# Patient Record
Sex: Female | Born: 1945
Health system: Southern US, Community
[De-identification: ages and names within clinical notes are randomized; demographics above are authoritative.]

## PROBLEM LIST (undated history)

## (undated) DIAGNOSIS — N1330 Unspecified hydronephrosis: Secondary | ICD-10-CM

## (undated) DIAGNOSIS — G959 Disease of spinal cord, unspecified: Secondary | ICD-10-CM

## (undated) DIAGNOSIS — C801 Malignant (primary) neoplasm, unspecified: Secondary | ICD-10-CM

## (undated) DIAGNOSIS — M8580 Other specified disorders of bone density and structure, unspecified site: Secondary | ICD-10-CM

## (undated) DIAGNOSIS — G4730 Sleep apnea, unspecified: Secondary | ICD-10-CM

## (undated) DIAGNOSIS — F419 Anxiety disorder, unspecified: Secondary | ICD-10-CM

## (undated) DIAGNOSIS — E039 Hypothyroidism, unspecified: Secondary | ICD-10-CM

## (undated) DIAGNOSIS — K219 Gastro-esophageal reflux disease without esophagitis: Secondary | ICD-10-CM

## (undated) DIAGNOSIS — D649 Anemia, unspecified: Secondary | ICD-10-CM

## (undated) DIAGNOSIS — R519 Headache, unspecified: Secondary | ICD-10-CM

## (undated) DIAGNOSIS — Z973 Presence of spectacles and contact lenses: Secondary | ICD-10-CM

## (undated) DIAGNOSIS — I350 Nonrheumatic aortic (valve) stenosis: Secondary | ICD-10-CM

## (undated) DIAGNOSIS — F32A Depression, unspecified: Secondary | ICD-10-CM

## (undated) DIAGNOSIS — R42 Dizziness and giddiness: Secondary | ICD-10-CM

## (undated) DIAGNOSIS — E7800 Pure hypercholesterolemia, unspecified: Secondary | ICD-10-CM

## (undated) DIAGNOSIS — F319 Bipolar disorder, unspecified: Secondary | ICD-10-CM

## (undated) DIAGNOSIS — T4145XA Adverse effect of unspecified anesthetic, initial encounter: Secondary | ICD-10-CM

## (undated) DIAGNOSIS — R200 Anesthesia of skin: Secondary | ICD-10-CM

## (undated) DIAGNOSIS — M51369 Other intervertebral disc degeneration, lumbar region without mention of lumbar back pain or lower extremity pain: Secondary | ICD-10-CM

## (undated) DIAGNOSIS — M4800 Spinal stenosis, site unspecified: Secondary | ICD-10-CM

## (undated) DIAGNOSIS — S42301A Unspecified fracture of shaft of humerus, right arm, initial encounter for closed fracture: Secondary | ICD-10-CM

## (undated) DIAGNOSIS — Z8619 Personal history of other infectious and parasitic diseases: Secondary | ICD-10-CM

## (undated) DIAGNOSIS — M1990 Unspecified osteoarthritis, unspecified site: Secondary | ICD-10-CM

## (undated) DIAGNOSIS — Z8639 Personal history of other endocrine, nutritional and metabolic disease: Secondary | ICD-10-CM

## (undated) DIAGNOSIS — E119 Type 2 diabetes mellitus without complications: Secondary | ICD-10-CM

## (undated) DIAGNOSIS — M810 Age-related osteoporosis without current pathological fracture: Secondary | ICD-10-CM

## (undated) DIAGNOSIS — T8859XA Other complications of anesthesia, initial encounter: Secondary | ICD-10-CM

## (undated) DIAGNOSIS — Z8669 Personal history of other diseases of the nervous system and sense organs: Secondary | ICD-10-CM

## (undated) DIAGNOSIS — M419 Scoliosis, unspecified: Secondary | ICD-10-CM

## (undated) DIAGNOSIS — M5136 Other intervertebral disc degeneration, lumbar region: Secondary | ICD-10-CM

## (undated) DIAGNOSIS — I700 Atherosclerosis of aorta: Secondary | ICD-10-CM

## (undated) DIAGNOSIS — R202 Paresthesia of skin: Secondary | ICD-10-CM

## (undated) HISTORY — PX: CARPAL TUNNEL RELEASE: SHX101

## (undated) HISTORY — PX: FRACTURE SURGERY: SHX138

## (undated) HISTORY — PX: APPENDECTOMY: SHX54

## (undated) HISTORY — PX: TOTAL SHOULDER ARTHROPLASTY: SHX126

---
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
## 2004-10-22 HISTORY — PX: GASTRIC BYPASS: SHX52

## 2004-10-22 HISTORY — PX: EYE SURGERY: SHX253

## 2012-09-25 DIAGNOSIS — Z01419 Encounter for gynecological examination (general) (routine) without abnormal findings: Secondary | ICD-10-CM | POA: Diagnosis not present

## 2012-09-25 DIAGNOSIS — G4700 Insomnia, unspecified: Secondary | ICD-10-CM | POA: Diagnosis not present

## 2012-09-25 DIAGNOSIS — L259 Unspecified contact dermatitis, unspecified cause: Secondary | ICD-10-CM | POA: Diagnosis not present

## 2012-09-25 DIAGNOSIS — F411 Generalized anxiety disorder: Secondary | ICD-10-CM | POA: Diagnosis not present

## 2012-10-02 DIAGNOSIS — F3189 Other bipolar disorder: Secondary | ICD-10-CM | POA: Diagnosis not present

## 2012-12-30 DIAGNOSIS — F3189 Other bipolar disorder: Secondary | ICD-10-CM | POA: Diagnosis not present

## 2013-02-26 DIAGNOSIS — H52219 Irregular astigmatism, unspecified eye: Secondary | ICD-10-CM | POA: Diagnosis not present

## 2013-03-26 DIAGNOSIS — F3189 Other bipolar disorder: Secondary | ICD-10-CM | POA: Diagnosis not present

## 2013-04-02 DIAGNOSIS — E119 Type 2 diabetes mellitus without complications: Secondary | ICD-10-CM | POA: Diagnosis not present

## 2013-04-02 DIAGNOSIS — R5383 Other fatigue: Secondary | ICD-10-CM | POA: Diagnosis not present

## 2013-04-02 DIAGNOSIS — E7800 Pure hypercholesterolemia, unspecified: Secondary | ICD-10-CM | POA: Diagnosis not present

## 2013-04-02 DIAGNOSIS — IMO0001 Reserved for inherently not codable concepts without codable children: Secondary | ICD-10-CM | POA: Diagnosis not present

## 2013-04-22 DIAGNOSIS — E538 Deficiency of other specified B group vitamins: Secondary | ICD-10-CM | POA: Diagnosis not present

## 2013-04-22 DIAGNOSIS — IMO0001 Reserved for inherently not codable concepts without codable children: Secondary | ICD-10-CM | POA: Diagnosis not present

## 2013-04-22 DIAGNOSIS — R5383 Other fatigue: Secondary | ICD-10-CM | POA: Diagnosis not present

## 2013-04-22 DIAGNOSIS — E119 Type 2 diabetes mellitus without complications: Secondary | ICD-10-CM | POA: Diagnosis not present

## 2013-04-22 DIAGNOSIS — E7800 Pure hypercholesterolemia, unspecified: Secondary | ICD-10-CM | POA: Diagnosis not present

## 2013-04-22 DIAGNOSIS — E559 Vitamin D deficiency, unspecified: Secondary | ICD-10-CM | POA: Diagnosis not present

## 2013-04-22 DIAGNOSIS — L659 Nonscarring hair loss, unspecified: Secondary | ICD-10-CM | POA: Diagnosis not present

## 2013-04-22 DIAGNOSIS — G4730 Sleep apnea, unspecified: Secondary | ICD-10-CM | POA: Diagnosis not present

## 2013-05-07 DIAGNOSIS — E119 Type 2 diabetes mellitus without complications: Secondary | ICD-10-CM | POA: Diagnosis not present

## 2013-05-07 DIAGNOSIS — G4700 Insomnia, unspecified: Secondary | ICD-10-CM | POA: Diagnosis not present

## 2013-05-07 DIAGNOSIS — E039 Hypothyroidism, unspecified: Secondary | ICD-10-CM | POA: Diagnosis not present

## 2013-05-07 DIAGNOSIS — E7800 Pure hypercholesterolemia, unspecified: Secondary | ICD-10-CM | POA: Diagnosis not present

## 2013-07-31 DIAGNOSIS — E119 Type 2 diabetes mellitus without complications: Secondary | ICD-10-CM | POA: Diagnosis not present

## 2013-07-31 DIAGNOSIS — E039 Hypothyroidism, unspecified: Secondary | ICD-10-CM | POA: Diagnosis not present

## 2013-08-18 DIAGNOSIS — E119 Type 2 diabetes mellitus without complications: Secondary | ICD-10-CM | POA: Diagnosis not present

## 2013-08-18 DIAGNOSIS — M542 Cervicalgia: Secondary | ICD-10-CM | POA: Diagnosis not present

## 2013-08-18 DIAGNOSIS — R29898 Other symptoms and signs involving the musculoskeletal system: Secondary | ICD-10-CM | POA: Diagnosis not present

## 2013-08-18 DIAGNOSIS — E039 Hypothyroidism, unspecified: Secondary | ICD-10-CM | POA: Diagnosis not present

## 2014-02-05 DIAGNOSIS — E7800 Pure hypercholesterolemia, unspecified: Secondary | ICD-10-CM | POA: Diagnosis not present

## 2014-02-05 DIAGNOSIS — Z1231 Encounter for screening mammogram for malignant neoplasm of breast: Secondary | ICD-10-CM | POA: Diagnosis not present

## 2014-02-05 DIAGNOSIS — E119 Type 2 diabetes mellitus without complications: Secondary | ICD-10-CM | POA: Diagnosis not present

## 2014-02-05 DIAGNOSIS — E039 Hypothyroidism, unspecified: Secondary | ICD-10-CM | POA: Diagnosis not present

## 2014-02-16 DIAGNOSIS — E039 Hypothyroidism, unspecified: Secondary | ICD-10-CM | POA: Diagnosis not present

## 2014-02-16 DIAGNOSIS — Z23 Encounter for immunization: Secondary | ICD-10-CM | POA: Diagnosis not present

## 2014-02-16 DIAGNOSIS — E7800 Pure hypercholesterolemia, unspecified: Secondary | ICD-10-CM | POA: Diagnosis not present

## 2014-02-16 DIAGNOSIS — J309 Allergic rhinitis, unspecified: Secondary | ICD-10-CM | POA: Diagnosis not present

## 2014-02-16 DIAGNOSIS — E559 Vitamin D deficiency, unspecified: Secondary | ICD-10-CM | POA: Diagnosis not present

## 2014-02-19 DIAGNOSIS — Z13820 Encounter for screening for osteoporosis: Secondary | ICD-10-CM | POA: Diagnosis not present

## 2014-02-19 DIAGNOSIS — Z780 Asymptomatic menopausal state: Secondary | ICD-10-CM | POA: Diagnosis not present

## 2014-02-19 DIAGNOSIS — R29890 Loss of height: Secondary | ICD-10-CM | POA: Diagnosis not present

## 2014-02-19 DIAGNOSIS — M949 Disorder of cartilage, unspecified: Secondary | ICD-10-CM | POA: Diagnosis not present

## 2014-02-19 DIAGNOSIS — M899 Disorder of bone, unspecified: Secondary | ICD-10-CM | POA: Diagnosis not present

## 2014-03-13 DIAGNOSIS — Z1211 Encounter for screening for malignant neoplasm of colon: Secondary | ICD-10-CM | POA: Diagnosis not present

## 2014-03-15 DIAGNOSIS — Z1211 Encounter for screening for malignant neoplasm of colon: Secondary | ICD-10-CM | POA: Diagnosis not present

## 2014-03-17 DIAGNOSIS — Z1211 Encounter for screening for malignant neoplasm of colon: Secondary | ICD-10-CM | POA: Diagnosis not present

## 2014-07-02 DIAGNOSIS — F3110 Bipolar disorder, current episode manic without psychotic features, unspecified: Secondary | ICD-10-CM | POA: Diagnosis not present

## 2014-07-28 DIAGNOSIS — F3110 Bipolar disorder, current episode manic without psychotic features, unspecified: Secondary | ICD-10-CM | POA: Diagnosis not present

## 2014-08-16 DIAGNOSIS — K219 Gastro-esophageal reflux disease without esophagitis: Secondary | ICD-10-CM | POA: Diagnosis not present

## 2014-08-16 DIAGNOSIS — E039 Hypothyroidism, unspecified: Secondary | ICD-10-CM | POA: Diagnosis not present

## 2014-08-16 DIAGNOSIS — Z5181 Encounter for therapeutic drug level monitoring: Secondary | ICD-10-CM | POA: Diagnosis not present

## 2014-08-16 DIAGNOSIS — R05 Cough: Secondary | ICD-10-CM | POA: Diagnosis not present

## 2014-08-16 DIAGNOSIS — M8580 Other specified disorders of bone density and structure, unspecified site: Secondary | ICD-10-CM | POA: Diagnosis not present

## 2014-08-16 DIAGNOSIS — E119 Type 2 diabetes mellitus without complications: Secondary | ICD-10-CM | POA: Diagnosis not present

## 2014-08-16 DIAGNOSIS — I10 Essential (primary) hypertension: Secondary | ICD-10-CM | POA: Diagnosis not present

## 2014-09-29 DIAGNOSIS — F3110 Bipolar disorder, current episode manic without psychotic features, unspecified: Secondary | ICD-10-CM | POA: Diagnosis not present

## 2014-11-17 DIAGNOSIS — M159 Polyosteoarthritis, unspecified: Secondary | ICD-10-CM | POA: Diagnosis not present

## 2014-11-17 DIAGNOSIS — R600 Localized edema: Secondary | ICD-10-CM | POA: Diagnosis not present

## 2014-11-17 DIAGNOSIS — Z9884 Bariatric surgery status: Secondary | ICD-10-CM | POA: Diagnosis not present

## 2014-11-17 DIAGNOSIS — Z23 Encounter for immunization: Secondary | ICD-10-CM | POA: Diagnosis not present

## 2014-11-17 DIAGNOSIS — E785 Hyperlipidemia, unspecified: Secondary | ICD-10-CM | POA: Diagnosis not present

## 2014-12-29 DIAGNOSIS — F3110 Bipolar disorder, current episode manic without psychotic features, unspecified: Secondary | ICD-10-CM | POA: Diagnosis not present

## 2015-03-09 DIAGNOSIS — M5412 Radiculopathy, cervical region: Secondary | ICD-10-CM | POA: Diagnosis not present

## 2015-03-09 DIAGNOSIS — E119 Type 2 diabetes mellitus without complications: Secondary | ICD-10-CM | POA: Diagnosis not present

## 2015-03-09 DIAGNOSIS — M654 Radial styloid tenosynovitis [de Quervain]: Secondary | ICD-10-CM | POA: Diagnosis not present

## 2015-03-09 DIAGNOSIS — R600 Localized edema: Secondary | ICD-10-CM | POA: Diagnosis not present

## 2015-03-09 DIAGNOSIS — E039 Hypothyroidism, unspecified: Secondary | ICD-10-CM | POA: Diagnosis not present

## 2015-03-09 DIAGNOSIS — M159 Polyosteoarthritis, unspecified: Secondary | ICD-10-CM | POA: Diagnosis not present

## 2015-03-11 DIAGNOSIS — M5412 Radiculopathy, cervical region: Secondary | ICD-10-CM | POA: Diagnosis not present

## 2015-03-11 DIAGNOSIS — M159 Polyosteoarthritis, unspecified: Secondary | ICD-10-CM | POA: Diagnosis not present

## 2015-03-14 DIAGNOSIS — M159 Polyosteoarthritis, unspecified: Secondary | ICD-10-CM | POA: Diagnosis not present

## 2015-03-14 DIAGNOSIS — M5412 Radiculopathy, cervical region: Secondary | ICD-10-CM | POA: Diagnosis not present

## 2015-03-16 DIAGNOSIS — M159 Polyosteoarthritis, unspecified: Secondary | ICD-10-CM | POA: Diagnosis not present

## 2015-03-16 DIAGNOSIS — M5412 Radiculopathy, cervical region: Secondary | ICD-10-CM | POA: Diagnosis not present

## 2015-03-22 DIAGNOSIS — M5412 Radiculopathy, cervical region: Secondary | ICD-10-CM | POA: Diagnosis not present

## 2015-03-22 DIAGNOSIS — M159 Polyosteoarthritis, unspecified: Secondary | ICD-10-CM | POA: Diagnosis not present

## 2015-03-24 DIAGNOSIS — M159 Polyosteoarthritis, unspecified: Secondary | ICD-10-CM | POA: Diagnosis not present

## 2015-03-24 DIAGNOSIS — M5412 Radiculopathy, cervical region: Secondary | ICD-10-CM | POA: Diagnosis not present

## 2015-03-28 DIAGNOSIS — M159 Polyosteoarthritis, unspecified: Secondary | ICD-10-CM | POA: Diagnosis not present

## 2015-03-28 DIAGNOSIS — M5412 Radiculopathy, cervical region: Secondary | ICD-10-CM | POA: Diagnosis not present

## 2015-03-30 DIAGNOSIS — M5412 Radiculopathy, cervical region: Secondary | ICD-10-CM | POA: Diagnosis not present

## 2015-03-30 DIAGNOSIS — F3110 Bipolar disorder, current episode manic without psychotic features, unspecified: Secondary | ICD-10-CM | POA: Diagnosis not present

## 2015-03-30 DIAGNOSIS — M159 Polyosteoarthritis, unspecified: Secondary | ICD-10-CM | POA: Diagnosis not present

## 2015-04-04 DIAGNOSIS — M159 Polyosteoarthritis, unspecified: Secondary | ICD-10-CM | POA: Diagnosis not present

## 2015-04-04 DIAGNOSIS — M5412 Radiculopathy, cervical region: Secondary | ICD-10-CM | POA: Diagnosis not present

## 2015-04-06 DIAGNOSIS — M5412 Radiculopathy, cervical region: Secondary | ICD-10-CM | POA: Diagnosis not present

## 2015-04-06 DIAGNOSIS — M159 Polyosteoarthritis, unspecified: Secondary | ICD-10-CM | POA: Diagnosis not present

## 2015-04-07 DIAGNOSIS — M159 Polyosteoarthritis, unspecified: Secondary | ICD-10-CM | POA: Diagnosis not present

## 2015-04-07 DIAGNOSIS — M5412 Radiculopathy, cervical region: Secondary | ICD-10-CM | POA: Diagnosis not present

## 2015-04-08 DIAGNOSIS — M5012 Cervical disc disorder with radiculopathy, mid-cervical region: Secondary | ICD-10-CM | POA: Diagnosis not present

## 2015-04-14 DIAGNOSIS — M159 Polyosteoarthritis, unspecified: Secondary | ICD-10-CM | POA: Diagnosis not present

## 2015-04-14 DIAGNOSIS — M5412 Radiculopathy, cervical region: Secondary | ICD-10-CM | POA: Diagnosis not present

## 2015-04-18 DIAGNOSIS — M25561 Pain in right knee: Secondary | ICD-10-CM | POA: Diagnosis not present

## 2015-04-19 DIAGNOSIS — M5412 Radiculopathy, cervical region: Secondary | ICD-10-CM | POA: Diagnosis not present

## 2015-04-19 DIAGNOSIS — M159 Polyosteoarthritis, unspecified: Secondary | ICD-10-CM | POA: Diagnosis not present

## 2015-04-22 DIAGNOSIS — M4802 Spinal stenosis, cervical region: Secondary | ICD-10-CM | POA: Diagnosis not present

## 2015-04-22 DIAGNOSIS — M5012 Cervical disc disorder with radiculopathy, mid-cervical region: Secondary | ICD-10-CM | POA: Diagnosis not present

## 2015-04-22 DIAGNOSIS — M4692 Unspecified inflammatory spondylopathy, cervical region: Secondary | ICD-10-CM | POA: Diagnosis not present

## 2015-04-27 DIAGNOSIS — M5412 Radiculopathy, cervical region: Secondary | ICD-10-CM | POA: Diagnosis not present

## 2015-04-27 DIAGNOSIS — M159 Polyosteoarthritis, unspecified: Secondary | ICD-10-CM | POA: Diagnosis not present

## 2015-05-11 DIAGNOSIS — M159 Polyosteoarthritis, unspecified: Secondary | ICD-10-CM | POA: Diagnosis not present

## 2015-05-11 DIAGNOSIS — M5412 Radiculopathy, cervical region: Secondary | ICD-10-CM | POA: Diagnosis not present

## 2015-05-17 DIAGNOSIS — M5412 Radiculopathy, cervical region: Secondary | ICD-10-CM | POA: Diagnosis not present

## 2015-05-17 DIAGNOSIS — M159 Polyosteoarthritis, unspecified: Secondary | ICD-10-CM | POA: Diagnosis not present

## 2015-05-19 DIAGNOSIS — M5412 Radiculopathy, cervical region: Secondary | ICD-10-CM | POA: Diagnosis not present

## 2015-05-19 DIAGNOSIS — M159 Polyosteoarthritis, unspecified: Secondary | ICD-10-CM | POA: Diagnosis not present

## 2015-05-23 DIAGNOSIS — M1811 Unilateral primary osteoarthritis of first carpometacarpal joint, right hand: Secondary | ICD-10-CM | POA: Diagnosis not present

## 2015-06-28 DIAGNOSIS — M1811 Unilateral primary osteoarthritis of first carpometacarpal joint, right hand: Secondary | ICD-10-CM | POA: Diagnosis not present

## 2015-06-30 DIAGNOSIS — F3110 Bipolar disorder, current episode manic without psychotic features, unspecified: Secondary | ICD-10-CM | POA: Diagnosis not present

## 2015-07-04 DIAGNOSIS — K219 Gastro-esophageal reflux disease without esophagitis: Secondary | ICD-10-CM | POA: Diagnosis not present

## 2015-07-04 DIAGNOSIS — Z0000 Encounter for general adult medical examination without abnormal findings: Secondary | ICD-10-CM | POA: Diagnosis not present

## 2015-07-04 DIAGNOSIS — R600 Localized edema: Secondary | ICD-10-CM | POA: Diagnosis not present

## 2015-07-04 DIAGNOSIS — H9193 Unspecified hearing loss, bilateral: Secondary | ICD-10-CM | POA: Diagnosis not present

## 2015-07-04 DIAGNOSIS — E119 Type 2 diabetes mellitus without complications: Secondary | ICD-10-CM | POA: Diagnosis not present

## 2015-07-04 DIAGNOSIS — M159 Polyosteoarthritis, unspecified: Secondary | ICD-10-CM | POA: Diagnosis not present

## 2015-07-04 DIAGNOSIS — E039 Hypothyroidism, unspecified: Secondary | ICD-10-CM | POA: Diagnosis not present

## 2015-07-04 DIAGNOSIS — J302 Other seasonal allergic rhinitis: Secondary | ICD-10-CM | POA: Diagnosis not present

## 2015-07-04 DIAGNOSIS — R296 Repeated falls: Secondary | ICD-10-CM | POA: Diagnosis not present

## 2015-07-04 DIAGNOSIS — M8589 Other specified disorders of bone density and structure, multiple sites: Secondary | ICD-10-CM | POA: Diagnosis not present

## 2015-07-04 DIAGNOSIS — E785 Hyperlipidemia, unspecified: Secondary | ICD-10-CM | POA: Diagnosis not present

## 2015-07-11 DIAGNOSIS — R296 Repeated falls: Secondary | ICD-10-CM | POA: Diagnosis not present

## 2015-07-11 DIAGNOSIS — R2689 Other abnormalities of gait and mobility: Secondary | ICD-10-CM | POA: Diagnosis not present

## 2015-07-12 DIAGNOSIS — R2689 Other abnormalities of gait and mobility: Secondary | ICD-10-CM | POA: Diagnosis not present

## 2015-07-12 DIAGNOSIS — R296 Repeated falls: Secondary | ICD-10-CM | POA: Diagnosis not present

## 2015-07-18 DIAGNOSIS — R2689 Other abnormalities of gait and mobility: Secondary | ICD-10-CM | POA: Diagnosis not present

## 2015-07-18 DIAGNOSIS — R296 Repeated falls: Secondary | ICD-10-CM | POA: Diagnosis not present

## 2015-07-21 DIAGNOSIS — R2689 Other abnormalities of gait and mobility: Secondary | ICD-10-CM | POA: Diagnosis not present

## 2015-07-21 DIAGNOSIS — R296 Repeated falls: Secondary | ICD-10-CM | POA: Diagnosis not present

## 2015-07-25 DIAGNOSIS — R2689 Other abnormalities of gait and mobility: Secondary | ICD-10-CM | POA: Diagnosis not present

## 2015-07-25 DIAGNOSIS — R296 Repeated falls: Secondary | ICD-10-CM | POA: Diagnosis not present

## 2015-07-28 DIAGNOSIS — F3110 Bipolar disorder, current episode manic without psychotic features, unspecified: Secondary | ICD-10-CM | POA: Diagnosis not present

## 2015-07-28 DIAGNOSIS — R2689 Other abnormalities of gait and mobility: Secondary | ICD-10-CM | POA: Diagnosis not present

## 2015-07-28 DIAGNOSIS — R296 Repeated falls: Secondary | ICD-10-CM | POA: Diagnosis not present

## 2015-08-03 DIAGNOSIS — H532 Diplopia: Secondary | ICD-10-CM | POA: Diagnosis not present

## 2015-08-03 DIAGNOSIS — R42 Dizziness and giddiness: Secondary | ICD-10-CM | POA: Diagnosis not present

## 2015-08-03 DIAGNOSIS — Z1211 Encounter for screening for malignant neoplasm of colon: Secondary | ICD-10-CM | POA: Diagnosis not present

## 2015-08-04 DIAGNOSIS — R2689 Other abnormalities of gait and mobility: Secondary | ICD-10-CM | POA: Diagnosis not present

## 2015-08-04 DIAGNOSIS — R296 Repeated falls: Secondary | ICD-10-CM | POA: Diagnosis not present

## 2015-08-08 DIAGNOSIS — R296 Repeated falls: Secondary | ICD-10-CM | POA: Diagnosis not present

## 2015-08-08 DIAGNOSIS — R2689 Other abnormalities of gait and mobility: Secondary | ICD-10-CM | POA: Diagnosis not present

## 2015-08-10 DIAGNOSIS — R296 Repeated falls: Secondary | ICD-10-CM | POA: Diagnosis not present

## 2015-08-10 DIAGNOSIS — R2689 Other abnormalities of gait and mobility: Secondary | ICD-10-CM | POA: Diagnosis not present

## 2015-08-24 DIAGNOSIS — R296 Repeated falls: Secondary | ICD-10-CM | POA: Diagnosis not present

## 2015-08-24 DIAGNOSIS — R2689 Other abnormalities of gait and mobility: Secondary | ICD-10-CM | POA: Diagnosis not present

## 2015-08-25 DIAGNOSIS — Z961 Presence of intraocular lens: Secondary | ICD-10-CM | POA: Diagnosis not present

## 2015-08-25 DIAGNOSIS — H5111 Convergence insufficiency: Secondary | ICD-10-CM | POA: Diagnosis not present

## 2015-08-25 DIAGNOSIS — E119 Type 2 diabetes mellitus without complications: Secondary | ICD-10-CM | POA: Diagnosis not present

## 2015-08-29 DIAGNOSIS — R2689 Other abnormalities of gait and mobility: Secondary | ICD-10-CM | POA: Diagnosis not present

## 2015-08-29 DIAGNOSIS — R296 Repeated falls: Secondary | ICD-10-CM | POA: Diagnosis not present

## 2015-09-01 DIAGNOSIS — R2689 Other abnormalities of gait and mobility: Secondary | ICD-10-CM | POA: Diagnosis not present

## 2015-09-01 DIAGNOSIS — R296 Repeated falls: Secondary | ICD-10-CM | POA: Diagnosis not present

## 2015-09-05 DIAGNOSIS — R2689 Other abnormalities of gait and mobility: Secondary | ICD-10-CM | POA: Diagnosis not present

## 2015-09-05 DIAGNOSIS — R296 Repeated falls: Secondary | ICD-10-CM | POA: Diagnosis not present

## 2015-09-07 DIAGNOSIS — R296 Repeated falls: Secondary | ICD-10-CM | POA: Diagnosis not present

## 2015-09-07 DIAGNOSIS — R2689 Other abnormalities of gait and mobility: Secondary | ICD-10-CM | POA: Diagnosis not present

## 2015-09-08 DIAGNOSIS — D125 Benign neoplasm of sigmoid colon: Secondary | ICD-10-CM | POA: Diagnosis not present

## 2015-09-08 DIAGNOSIS — Z1211 Encounter for screening for malignant neoplasm of colon: Secondary | ICD-10-CM | POA: Diagnosis not present

## 2015-09-08 DIAGNOSIS — K640 First degree hemorrhoids: Secondary | ICD-10-CM | POA: Diagnosis not present

## 2015-09-08 DIAGNOSIS — K5730 Diverticulosis of large intestine without perforation or abscess without bleeding: Secondary | ICD-10-CM | POA: Diagnosis not present

## 2015-09-08 DIAGNOSIS — K635 Polyp of colon: Secondary | ICD-10-CM | POA: Diagnosis not present

## 2015-09-12 DIAGNOSIS — R296 Repeated falls: Secondary | ICD-10-CM | POA: Diagnosis not present

## 2015-09-12 DIAGNOSIS — R2689 Other abnormalities of gait and mobility: Secondary | ICD-10-CM | POA: Diagnosis not present

## 2015-09-19 DIAGNOSIS — R2689 Other abnormalities of gait and mobility: Secondary | ICD-10-CM | POA: Diagnosis not present

## 2015-09-19 DIAGNOSIS — R296 Repeated falls: Secondary | ICD-10-CM | POA: Diagnosis not present

## 2015-09-21 DIAGNOSIS — R296 Repeated falls: Secondary | ICD-10-CM | POA: Diagnosis not present

## 2015-09-21 DIAGNOSIS — R2689 Other abnormalities of gait and mobility: Secondary | ICD-10-CM | POA: Diagnosis not present

## 2015-09-22 DIAGNOSIS — F3110 Bipolar disorder, current episode manic without psychotic features, unspecified: Secondary | ICD-10-CM | POA: Diagnosis not present

## 2015-09-29 DIAGNOSIS — M4712 Other spondylosis with myelopathy, cervical region: Secondary | ICD-10-CM | POA: Diagnosis not present

## 2015-09-29 DIAGNOSIS — R2689 Other abnormalities of gait and mobility: Secondary | ICD-10-CM | POA: Diagnosis not present

## 2015-09-29 DIAGNOSIS — H532 Diplopia: Secondary | ICD-10-CM | POA: Diagnosis not present

## 2015-10-07 DIAGNOSIS — R2689 Other abnormalities of gait and mobility: Secondary | ICD-10-CM | POA: Diagnosis not present

## 2015-10-07 DIAGNOSIS — H532 Diplopia: Secondary | ICD-10-CM | POA: Diagnosis not present

## 2015-10-07 DIAGNOSIS — Z1389 Encounter for screening for other disorder: Secondary | ICD-10-CM | POA: Diagnosis not present

## 2015-11-02 DIAGNOSIS — R2689 Other abnormalities of gait and mobility: Secondary | ICD-10-CM | POA: Diagnosis not present

## 2015-11-14 ENCOUNTER — Other Ambulatory Visit (HOSPITAL_COMMUNITY): Payer: Self-pay | Admitting: Neurological Surgery

## 2015-11-14 DIAGNOSIS — M4313 Spondylolisthesis, cervicothoracic region: Secondary | ICD-10-CM | POA: Diagnosis not present

## 2015-11-14 DIAGNOSIS — M4802 Spinal stenosis, cervical region: Secondary | ICD-10-CM | POA: Diagnosis not present

## 2015-11-22 ENCOUNTER — Encounter (HOSPITAL_COMMUNITY): Payer: Self-pay

## 2015-11-22 ENCOUNTER — Encounter (HOSPITAL_COMMUNITY)
Admission: RE | Admit: 2015-11-22 | Discharge: 2015-11-22 | Disposition: A | Discharge status: Home / Self Care | Payer: Medicare Other | Source: Ambulatory Visit | Attending: Neurological Surgery | Admitting: Neurological Surgery

## 2015-11-22 ENCOUNTER — Ambulatory Visit (HOSPITAL_COMMUNITY)
Admission: RE | Admit: 2015-11-22 | Discharge: 2015-11-22 | Disposition: A | Discharge status: Home / Self Care | Payer: Medicare Other | Source: Ambulatory Visit | Attending: Neurological Surgery | Admitting: Neurological Surgery

## 2015-11-22 DIAGNOSIS — M5023 Other cervical disc displacement, cervicothoracic region: Secondary | ICD-10-CM | POA: Diagnosis not present

## 2015-11-22 DIAGNOSIS — Z01812 Encounter for preprocedural laboratory examination: Secondary | ICD-10-CM

## 2015-11-22 DIAGNOSIS — Z7982 Long term (current) use of aspirin: Secondary | ICD-10-CM | POA: Insufficient documentation

## 2015-11-22 DIAGNOSIS — M4803 Spinal stenosis, cervicothoracic region: Secondary | ICD-10-CM | POA: Diagnosis not present

## 2015-11-22 DIAGNOSIS — Z01810 Encounter for preprocedural cardiovascular examination: Secondary | ICD-10-CM | POA: Insufficient documentation

## 2015-11-22 DIAGNOSIS — Z79899 Other long term (current) drug therapy: Secondary | ICD-10-CM | POA: Insufficient documentation

## 2015-11-22 DIAGNOSIS — M4802 Spinal stenosis, cervical region: Secondary | ICD-10-CM | POA: Insufficient documentation

## 2015-11-22 DIAGNOSIS — Z01818 Encounter for other preprocedural examination: Secondary | ICD-10-CM | POA: Insufficient documentation

## 2015-11-22 DIAGNOSIS — Z01811 Encounter for preprocedural respiratory examination: Secondary | ICD-10-CM | POA: Diagnosis not present

## 2015-11-22 DIAGNOSIS — M47813 Spondylosis without myelopathy or radiculopathy, cervicothoracic region: Secondary | ICD-10-CM | POA: Diagnosis not present

## 2015-11-22 DIAGNOSIS — E039 Hypothyroidism, unspecified: Secondary | ICD-10-CM | POA: Diagnosis not present

## 2015-11-22 HISTORY — DX: Unspecified osteoarthritis, unspecified site: M19.90

## 2015-11-22 HISTORY — DX: Presence of spectacles and contact lenses: Z97.3

## 2015-11-22 HISTORY — DX: Personal history of other diseases of the nervous system and sense organs: Z86.69

## 2015-11-22 HISTORY — DX: Hypothyroidism, unspecified: E03.9

## 2015-11-22 HISTORY — DX: Anesthesia of skin: R20.0

## 2015-11-22 HISTORY — DX: Bipolar disorder, unspecified: F31.9

## 2015-11-22 HISTORY — DX: Anesthesia of skin: R20.2

## 2015-11-22 LAB — CBC WITH DIFFERENTIAL/PLATELET
BASOS PCT: 0 %
Basophils Absolute: 0 10*3/uL (ref 0.0–0.1)
EOS ABS: 0.2 10*3/uL (ref 0.0–0.7)
EOS PCT: 2 %
HCT: 34.6 % — ABNORMAL LOW (ref 36.0–46.0)
HEMOGLOBIN: 11.6 g/dL — AB (ref 12.0–15.0)
Lymphocytes Relative: 30 %
Lymphs Abs: 2.6 10*3/uL (ref 0.7–4.0)
MCH: 31.6 pg (ref 26.0–34.0)
MCHC: 33.5 g/dL (ref 30.0–36.0)
MCV: 94.3 fL (ref 78.0–100.0)
MONOS PCT: 6 %
Monocytes Absolute: 0.6 10*3/uL (ref 0.1–1.0)
NEUTROS PCT: 62 %
Neutro Abs: 5.4 10*3/uL (ref 1.7–7.7)
PLATELETS: 226 10*3/uL (ref 150–400)
RBC: 3.67 MIL/uL — AB (ref 3.87–5.11)
RDW: 13.4 % (ref 11.5–15.5)
WBC: 8.7 10*3/uL (ref 4.0–10.5)

## 2015-11-22 LAB — TYPE AND SCREEN
ABO/RH(D): A POS
Antibody Screen: NEGATIVE

## 2015-11-22 LAB — BASIC METABOLIC PANEL
Anion gap: 9 (ref 5–15)
BUN: 7 mg/dL (ref 6–20)
CALCIUM: 9.1 mg/dL (ref 8.9–10.3)
CO2: 27 mmol/L (ref 22–32)
CREATININE: 0.72 mg/dL (ref 0.44–1.00)
Chloride: 107 mmol/L (ref 101–111)
GFR calc Af Amer: >60 mL/min (ref >60)
GFR calc non Af Amer: >60 mL/min (ref >60)
Glucose, Bld: 102 mg/dL — ABNORMAL HIGH (ref 65–99)
Potassium: 3.6 mmol/L (ref 3.5–5.1)
Sodium: 143 mmol/L (ref 135–145)

## 2015-11-22 LAB — SURGICAL PCR SCREEN
MRSA, PCR: NEGATIVE
Staphylococcus aureus: NEGATIVE

## 2015-11-22 LAB — PROTIME-INR
INR: 0.94 (ref 0.00–1.49)
PROTHROMBIN TIME: 12.7 s (ref 11.6–15.2)

## 2015-11-22 LAB — ABO/RH: ABO/RH(D): A POS

## 2015-11-22 NOTE — Pre-Procedure Instructions (Signed)
    MIOSHA SUITT  11/22/2015      [id number] - [place], [place] - [address] [place] [address] Phone: [phone] Fax: [phone]    Your procedure is scheduled on Friday, February 3rd, 2017.  Report to [facility] Admitting at 8:00 A.M.  Call this number if you have problems the morning of surgery:  [phone]   Remember:  Do not eat food or drink liquids after midnight.   Take these medicines the morning of surgery with A SIP OF WATER: Acetaminophen (Tylenol) if needed, Alprazolam (Xanax) if needed, Lamotrigine (Lamictal), Levothyroxine (Synthroid), Paroxetine (Paxil).  Stop taking: Aspirin, Meloxicam (Mobic), NSAIDS, Aleve, Naproxen, Ibuprofen, Advil, Motrin, BC's, Goody's, Fish oil, all herbal medications, and all vitamins.    Do not wear jewelry, make-up or nail polish.  Do not wear lotions, powders, or perfumes.  You may NOT wear deodorant.  Do not shave 48 hours prior to surgery.    Do not bring valuables to the hospital.  [facility] is not responsible for any belongings or valuables.  Contacts, dentures or bridgework may not be worn into surgery.  Leave your suitcase in the car.  After surgery it may be brought to your room.  For patients admitted to the hospital, discharge time will be determined by your treatment team.  Patients discharged the day of surgery will not be allowed to drive home.   Special instructions:  See attached.   Please read over the following fact sheets that you were given. Pain Booklet, Coughing and Deep Breathing, Blood Transfusion Information, MRSA Information and Surgical Site Infection Prevention

## 2015-11-22 NOTE — Progress Notes (Signed)
PCP - Dr. Betty Martinique Cardiologist - denies  EKG - 11/22/15 CXR - 11/22/15  Echo/Stress test/Cath - denies  Patient denies chest pain and shortness of breath at PAT appointment.

## 2015-11-25 ENCOUNTER — Encounter (HOSPITAL_COMMUNITY)
Admission: RE | Disposition: A | Discharge status: Home / Self Care | Payer: Self-pay | Source: Ambulatory Visit | Attending: Neurological Surgery

## 2015-11-25 ENCOUNTER — Inpatient Hospital Stay (HOSPITAL_COMMUNITY): Payer: Medicare Other | Admitting: Certified Registered Nurse Anesthetist

## 2015-11-25 ENCOUNTER — Encounter (HOSPITAL_COMMUNITY): Payer: Self-pay | Admitting: *Deleted

## 2015-11-25 ENCOUNTER — Inpatient Hospital Stay (HOSPITAL_COMMUNITY): Payer: Medicare Other

## 2015-11-25 ENCOUNTER — Inpatient Hospital Stay (HOSPITAL_COMMUNITY)
Admission: RE | Admit: 2015-11-25 | Discharge: 2015-11-29 | DRG: 473 | Disposition: A | Discharge status: Home / Self Care | Payer: Medicare Other | Source: Ambulatory Visit | Attending: Neurological Surgery | Admitting: Neurological Surgery

## 2015-11-25 DIAGNOSIS — Z96612 Presence of left artificial shoulder joint: Secondary | ICD-10-CM | POA: Diagnosis present

## 2015-11-25 DIAGNOSIS — M4803 Spinal stenosis, cervicothoracic region: Secondary | ICD-10-CM | POA: Diagnosis present

## 2015-11-25 DIAGNOSIS — Z9884 Bariatric surgery status: Secondary | ICD-10-CM

## 2015-11-25 DIAGNOSIS — M47813 Spondylosis without myelopathy or radiculopathy, cervicothoracic region: Secondary | ICD-10-CM | POA: Diagnosis present

## 2015-11-25 DIAGNOSIS — Z96611 Presence of right artificial shoulder joint: Secondary | ICD-10-CM | POA: Diagnosis present

## 2015-11-25 DIAGNOSIS — Z419 Encounter for procedure for purposes other than remedying health state, unspecified: Secondary | ICD-10-CM

## 2015-11-25 DIAGNOSIS — M542 Cervicalgia: Secondary | ICD-10-CM | POA: Diagnosis not present

## 2015-11-25 DIAGNOSIS — M5023 Other cervical disc displacement, cervicothoracic region: Secondary | ICD-10-CM | POA: Diagnosis not present

## 2015-11-25 DIAGNOSIS — Z87891 Personal history of nicotine dependence: Secondary | ICD-10-CM | POA: Diagnosis not present

## 2015-11-25 DIAGNOSIS — F319 Bipolar disorder, unspecified: Secondary | ICD-10-CM | POA: Diagnosis present

## 2015-11-25 DIAGNOSIS — E039 Hypothyroidism, unspecified: Secondary | ICD-10-CM | POA: Diagnosis not present

## 2015-11-25 DIAGNOSIS — R079 Chest pain, unspecified: Secondary | ICD-10-CM | POA: Diagnosis not present

## 2015-11-25 DIAGNOSIS — M47812 Spondylosis without myelopathy or radiculopathy, cervical region: Secondary | ICD-10-CM | POA: Diagnosis not present

## 2015-11-25 DIAGNOSIS — Z7982 Long term (current) use of aspirin: Secondary | ICD-10-CM

## 2015-11-25 DIAGNOSIS — M4802 Spinal stenosis, cervical region: Secondary | ICD-10-CM | POA: Diagnosis not present

## 2015-11-25 DIAGNOSIS — Z79899 Other long term (current) drug therapy: Secondary | ICD-10-CM | POA: Diagnosis not present

## 2015-11-25 DIAGNOSIS — Z981 Arthrodesis status: Secondary | ICD-10-CM

## 2015-11-25 DIAGNOSIS — M4312 Spondylolisthesis, cervical region: Secondary | ICD-10-CM | POA: Diagnosis not present

## 2015-11-25 DIAGNOSIS — M4322 Fusion of spine, cervical region: Secondary | ICD-10-CM | POA: Diagnosis not present

## 2015-11-25 HISTORY — PX: POSTERIOR CERVICAL FUSION/FORAMINOTOMY: SHX5038

## 2015-11-25 SURGERY — POSTERIOR CERVICAL FUSION/FORAMINOTOMY LEVEL 3
Anesthesia: General

## 2015-11-25 MED ORDER — DEXAMETHASONE SODIUM PHOSPHATE 10 MG/ML IJ SOLN
10.0000 mg | INTRAMUSCULAR | Status: AC
  Administered 2015-11-25: 10 mg via INTRAVENOUS
  Filled 2015-11-25: qty 1

## 2015-11-25 MED ORDER — LAMOTRIGINE 100 MG PO TABS
50.0000 mg | ORAL_TABLET | Freq: Every day | ORAL | Status: DC
  Administered 2015-11-26 – 2015-11-29 (×4): 50 mg via ORAL
  Filled 2015-11-25 (×4): qty 1

## 2015-11-25 MED ORDER — PHENOL 1.4 % MT LIQD
1.0000 | OROMUCOSAL | Status: DC | PRN
Start: 2015-11-25 — End: 2015-11-29

## 2015-11-25 MED ORDER — LIDOCAINE HCL (CARDIAC) 20 MG/ML IV SOLN
INTRAVENOUS | Status: DC | PRN
  Administered 2015-11-25: 60 mg via INTRAVENOUS

## 2015-11-25 MED ORDER — ROCURONIUM BROMIDE 100 MG/10ML IV SOLN
INTRAVENOUS | Status: DC | PRN
  Administered 2015-11-25: 10 mg via INTRAVENOUS
  Administered 2015-11-25: 40 mg via INTRAVENOUS

## 2015-11-25 MED ORDER — SODIUM CHLORIDE 0.9 % IV SOLN: 250.0000 mL | INTRAVENOUS | Status: DC

## 2015-11-25 MED ORDER — POTASSIUM CHLORIDE IN NACL 20-0.9 MEQ/L-% IV SOLN
INTRAVENOUS | Status: DC
  Administered 2015-11-25 – 2015-11-26 (×2): via INTRAVENOUS
  Filled 2015-11-25 (×2): qty 1000

## 2015-11-25 MED ORDER — THROMBIN 20000 UNITS EX SOLR
CUTANEOUS | Status: DC | PRN
  Administered 2015-11-25: 12:00:00 via TOPICAL

## 2015-11-25 MED ORDER — LACTATED RINGERS IV SOLN
INTRAVENOUS | Status: DC
  Administered 2015-11-25 (×2): via INTRAVENOUS

## 2015-11-25 MED ORDER — MENTHOL 3 MG MT LOZG: 1.0000 | LOZENGE | OROMUCOSAL | Status: DC | PRN

## 2015-11-25 MED ORDER — LEVOTHYROXINE SODIUM 50 MCG PO TABS
50.0000 ug | ORAL_TABLET | Freq: Every day | ORAL | Status: DC
  Administered 2015-11-26 – 2015-11-29 (×4): 50 ug via ORAL
  Filled 2015-11-25 (×4): qty 1

## 2015-11-25 MED ORDER — ONDANSETRON HCL 4 MG/2ML IJ SOLN
INTRAMUSCULAR | Status: AC
  Filled 2015-11-25: qty 2

## 2015-11-25 MED ORDER — MORPHINE SULFATE (PF) 2 MG/ML IV SOLN
1.0000 mg | INTRAVENOUS | Status: DC | PRN
  Administered 2015-11-26 – 2015-11-29 (×4): 1 mg via INTRAVENOUS
  Filled 2015-11-25 (×4): qty 1

## 2015-11-25 MED ORDER — ONDANSETRON HCL 4 MG/2ML IJ SOLN
INTRAMUSCULAR | Status: DC | PRN
  Administered 2015-11-25: 4 mg via INTRAVENOUS

## 2015-11-25 MED ORDER — SODIUM CHLORIDE 0.9% FLUSH
3.0000 mL | Freq: Two times a day (BID) | INTRAVENOUS | Status: DC
  Administered 2015-11-25: 10 mL via INTRAVENOUS
  Administered 2015-11-26 – 2015-11-28 (×5): 3 mL via INTRAVENOUS

## 2015-11-25 MED ORDER — SUGAMMADEX SODIUM 200 MG/2ML IV SOLN
INTRAVENOUS | Status: AC
  Filled 2015-11-25: qty 2

## 2015-11-25 MED ORDER — METHOCARBAMOL 500 MG PO TABS
500.0000 mg | ORAL_TABLET | Freq: Four times a day (QID) | ORAL | Status: DC | PRN
  Administered 2015-11-26 – 2015-11-28 (×6): 500 mg via ORAL
  Filled 2015-11-25 (×6): qty 1

## 2015-11-25 MED ORDER — ONDANSETRON HCL 4 MG/2ML IJ SOLN: 4.0000 mg | INTRAMUSCULAR | Status: DC | PRN

## 2015-11-25 MED ORDER — THROMBIN 5000 UNITS EX SOLR
CUTANEOUS | Status: DC | PRN
  Administered 2015-11-25: 12:00:00 via TOPICAL

## 2015-11-25 MED ORDER — CEFAZOLIN SODIUM 1-5 GM-% IV SOLN
1.0000 g | Freq: Three times a day (TID) | INTRAVENOUS | Status: AC
  Administered 2015-11-25 – 2015-11-26 (×2): 1 g via INTRAVENOUS
  Filled 2015-11-25 (×3): qty 50

## 2015-11-25 MED ORDER — TRAZODONE HCL 50 MG PO TABS: 25.0000 mg | ORAL_TABLET | Freq: Every evening | ORAL | Status: DC | PRN

## 2015-11-25 MED ORDER — PROPOFOL 10 MG/ML IV BOLUS
INTRAVENOUS | Status: AC
  Filled 2015-11-25: qty 20

## 2015-11-25 MED ORDER — BACITRACIN ZINC 500 UNIT/GM EX OINT
TOPICAL_OINTMENT | CUTANEOUS | Status: DC | PRN
  Administered 2015-11-25: 1 via TOPICAL

## 2015-11-25 MED ORDER — ALPRAZOLAM 0.5 MG PO TABS: 0.5000 mg | ORAL_TABLET | Freq: Two times a day (BID) | ORAL | Status: DC | PRN

## 2015-11-25 MED ORDER — FENTANYL CITRATE (PF) 250 MCG/5ML IJ SOLN
INTRAMUSCULAR | Status: AC
  Filled 2015-11-25: qty 5

## 2015-11-25 MED ORDER — ACETAMINOPHEN 325 MG PO TABS
650.0000 mg | ORAL_TABLET | ORAL | Status: DC | PRN
  Filled 2015-11-25: qty 2

## 2015-11-25 MED ORDER — AMPHETAMINE-DEXTROAMPHETAMINE 10 MG PO TABS
30.0000 mg | ORAL_TABLET | Freq: Two times a day (BID) | ORAL | Status: DC
  Administered 2015-11-28 – 2015-11-29 (×2): 30 mg via ORAL
  Filled 2015-11-25 (×6): qty 3

## 2015-11-25 MED ORDER — HYDROMORPHONE HCL 1 MG/ML IJ SOLN
0.2500 mg | INTRAMUSCULAR | Status: DC | PRN
  Administered 2015-11-25 (×2): 0.5 mg via INTRAVENOUS

## 2015-11-25 MED ORDER — HYDROMORPHONE HCL 1 MG/ML IJ SOLN
INTRAMUSCULAR | Status: AC
  Filled 2015-11-25: qty 1

## 2015-11-25 MED ORDER — PHENYLEPHRINE HCL 10 MG/ML IJ SOLN
INTRAMUSCULAR | Status: DC | PRN
  Administered 2015-11-25: 40 ug via INTRAVENOUS
  Administered 2015-11-25: 80 ug via INTRAVENOUS
  Administered 2015-11-25 (×2): 40 ug via INTRAVENOUS
  Administered 2015-11-25: 80 ug via INTRAVENOUS

## 2015-11-25 MED ORDER — OXYCODONE-ACETAMINOPHEN 5-325 MG PO TABS
1.0000 | ORAL_TABLET | ORAL | Status: DC | PRN
  Administered 2015-11-25 – 2015-11-27 (×9): 2 via ORAL
  Filled 2015-11-25 (×9): qty 2

## 2015-11-25 MED ORDER — METHOCARBAMOL 1000 MG/10ML IJ SOLN
500.0000 mg | Freq: Four times a day (QID) | INTRAVENOUS | Status: DC | PRN
  Filled 2015-11-25: qty 5

## 2015-11-25 MED ORDER — 0.9 % SODIUM CHLORIDE (POUR BTL) OPTIME
TOPICAL | Status: DC | PRN
  Administered 2015-11-25: 1000 mL

## 2015-11-25 MED ORDER — FUROSEMIDE 20 MG PO TABS: 20.0000 mg | ORAL_TABLET | Freq: Every day | ORAL | Status: DC | PRN

## 2015-11-25 MED ORDER — SUGAMMADEX SODIUM 200 MG/2ML IV SOLN
INTRAVENOUS | Status: DC | PRN
  Administered 2015-11-25: 100 mg via INTRAVENOUS

## 2015-11-25 MED ORDER — SODIUM CHLORIDE 0.9% FLUSH: 3.0000 mL | INTRAVENOUS | Status: DC | PRN

## 2015-11-25 MED ORDER — VANCOMYCIN HCL 1000 MG IV SOLR
INTRAVENOUS | Status: DC | PRN
  Administered 2015-11-25: 1000 mg via TOPICAL

## 2015-11-25 MED ORDER — ROCURONIUM BROMIDE 50 MG/5ML IV SOLN
INTRAVENOUS | Status: AC
  Filled 2015-11-25: qty 1

## 2015-11-25 MED ORDER — PAROXETINE HCL 20 MG PO TABS
40.0000 mg | ORAL_TABLET | ORAL | Status: DC
  Administered 2015-11-26 – 2015-11-29 (×4): 40 mg via ORAL
  Filled 2015-11-25 (×4): qty 2

## 2015-11-25 MED ORDER — CEFAZOLIN SODIUM-DEXTROSE 2-3 GM-% IV SOLR
2.0000 g | INTRAVENOUS | Status: AC
  Administered 2015-11-25: 2 g via INTRAVENOUS
  Filled 2015-11-25: qty 50

## 2015-11-25 MED ORDER — VANCOMYCIN HCL 1000 MG IV SOLR
INTRAVENOUS | Status: AC
Start: 2015-11-25 — End: 2015-11-25
  Filled 2015-11-25: qty 1000

## 2015-11-25 MED ORDER — FERROUS SULFATE 325 (65 FE) MG PO TABS
325.0000 mg | ORAL_TABLET | Freq: Every day | ORAL | Status: DC
  Administered 2015-11-26 – 2015-11-29 (×4): 325 mg via ORAL
  Filled 2015-11-25 (×4): qty 1

## 2015-11-25 MED ORDER — BUPIVACAINE HCL (PF) 0.25 % IJ SOLN
INTRAMUSCULAR | Status: DC | PRN
  Administered 2015-11-25: 9 mL

## 2015-11-25 MED ORDER — PROMETHAZINE HCL 25 MG/ML IJ SOLN: 6.2500 mg | INTRAMUSCULAR | Status: DC | PRN

## 2015-11-25 MED ORDER — ACETAMINOPHEN 650 MG RE SUPP: 650.0000 mg | RECTAL | Status: DC | PRN

## 2015-11-25 MED ORDER — FENTANYL CITRATE (PF) 100 MCG/2ML IJ SOLN
INTRAMUSCULAR | Status: DC | PRN
  Administered 2015-11-25 (×3): 50 ug via INTRAVENOUS
  Administered 2015-11-25: 100 ug via INTRAVENOUS

## 2015-11-25 MED ORDER — PROPOFOL 10 MG/ML IV BOLUS
INTRAVENOUS | Status: DC | PRN
  Administered 2015-11-25: 50 mg via INTRAVENOUS
  Administered 2015-11-25: 40 mg via INTRAVENOUS
  Administered 2015-11-25: 100 mg via INTRAVENOUS
  Administered 2015-11-25: 50 mg via INTRAVENOUS

## 2015-11-25 MED ORDER — SODIUM CHLORIDE 0.9 % IR SOLN
Status: DC | PRN
  Administered 2015-11-25: 12:00:00

## 2015-11-25 SURGICAL SUPPLY — 56 items
BAG DECANTER FOR FLEXI CONT (MISCELLANEOUS) ×3 IMPLANT
BENZOIN TINCTURE PRP APPL 2/3 (GAUZE/BANDAGES/DRESSINGS) ×3 IMPLANT
BIT DRILL VUEPOINT II (BIT) ×1 IMPLANT
BLADE CLIPPER SURG (BLADE) IMPLANT
BUR MATCHSTICK NEURO 3.0 LAGG (BURR) IMPLANT
CANISTER SUCT 3000ML PPV (MISCELLANEOUS) ×3 IMPLANT
CLOSURE WOUND 1/2 X4 (GAUZE/BANDAGES/DRESSINGS) ×1
DRAPE C-ARM 42X72 X-RAY (DRAPES) ×6 IMPLANT
DRAPE LAPAROTOMY 100X72 PEDS (DRAPES) ×3 IMPLANT
DRAPE MICROSCOPE LEICA (MISCELLANEOUS) IMPLANT
DRAPE POUCH INSTRU U-SHP 10X18 (DRAPES) ×3 IMPLANT
DRILL BIT VUEPOINT II (BIT) ×2
DRSG OPSITE 4X5.5 SM (GAUZE/BANDAGES/DRESSINGS) ×3 IMPLANT
DRSG OPSITE POSTOP 4X6 (GAUZE/BANDAGES/DRESSINGS) ×3 IMPLANT
DURAPREP 26ML APPLICATOR (WOUND CARE) ×3 IMPLANT
ELECT REM PT RETURN 9FT ADLT (ELECTROSURGICAL) ×3
ELECTRODE REM PT RTRN 9FT ADLT (ELECTROSURGICAL) ×1 IMPLANT
EVACUATOR 1/8 PVC DRAIN (DRAIN) ×3 IMPLANT
GAUZE SPONGE 4X4 12PLY STRL (GAUZE/BANDAGES/DRESSINGS) ×3 IMPLANT
GAUZE SPONGE 4X4 16PLY XRAY LF (GAUZE/BANDAGES/DRESSINGS) IMPLANT
GLOVE BIO SURGEON STRL SZ8 (GLOVE) ×3 IMPLANT
GLOVE BIOGEL PI IND STRL 7.5 (GLOVE) ×1 IMPLANT
GLOVE BIOGEL PI INDICATOR 7.5 (GLOVE) ×2
GLOVE ECLIPSE 7.5 STRL STRAW (GLOVE) ×3 IMPLANT
GOWN STRL REUS W/ TWL LRG LVL3 (GOWN DISPOSABLE) IMPLANT
GOWN STRL REUS W/ TWL XL LVL3 (GOWN DISPOSABLE) ×2 IMPLANT
GOWN STRL REUS W/TWL 2XL LVL3 (GOWN DISPOSABLE) IMPLANT
GOWN STRL REUS W/TWL LRG LVL3 (GOWN DISPOSABLE)
GOWN STRL REUS W/TWL XL LVL3 (GOWN DISPOSABLE) ×4
HEMOSTAT POWDER KIT SURGIFOAM (HEMOSTASIS) IMPLANT
KIT BASIN OR (CUSTOM PROCEDURE TRAY) ×3 IMPLANT
KIT ROOM TURNOVER OR (KITS) ×3 IMPLANT
LIQUID BAND (GAUZE/BANDAGES/DRESSINGS) ×3 IMPLANT
NEEDLE HYPO 22GX1.5 SAFETY (NEEDLE) ×3 IMPLANT
NEEDLE SPNL 20GX3.5 QUINCKE YW (NEEDLE) IMPLANT
NS IRRIG 1000ML POUR BTL (IV SOLUTION) ×3 IMPLANT
PACK LAMINECTOMY NEURO (CUSTOM PROCEDURE TRAY) ×3 IMPLANT
PAD ARMBOARD 7.5X6 YLW CONV (MISCELLANEOUS) ×3 IMPLANT
PIN MAYFIELD SKULL DISP (PIN) ×3 IMPLANT
ROD 120MM (Rod) ×2 IMPLANT
ROD SPNL 120X3.5XNS LF TI (Rod) ×1 IMPLANT
RUBBERBAND STERILE (MISCELLANEOUS) IMPLANT
SCREW MA MM 3.5X12 (Screw) ×12 IMPLANT
SCREW SET THREADED (Screw) ×18 IMPLANT
SCREW VUEPOINT 3.5X18MM (Screw) ×6 IMPLANT
SPONGE LAP 4X18 X RAY DECT (DISPOSABLE) IMPLANT
SPONGE SURGIFOAM ABS GEL 100 (HEMOSTASIS) ×3 IMPLANT
STRIP CLOSURE SKIN 1/2X4 (GAUZE/BANDAGES/DRESSINGS) ×2 IMPLANT
SUT VIC AB 0 CT1 18XCR BRD8 (SUTURE) ×1 IMPLANT
SUT VIC AB 0 CT1 8-18 (SUTURE) ×2
SUT VIC AB 2-0 CP2 18 (SUTURE) ×3 IMPLANT
SUT VIC AB 3-0 SH 8-18 (SUTURE) ×6 IMPLANT
SWABSTICK BENZOIN STERILE (MISCELLANEOUS) ×3 IMPLANT
TOWEL OR 17X24 6PK STRL BLUE (TOWEL DISPOSABLE) ×3 IMPLANT
TOWEL OR 17X26 10 PK STRL BLUE (TOWEL DISPOSABLE) ×3 IMPLANT
WATER STERILE IRR 1000ML POUR (IV SOLUTION) ×3 IMPLANT

## 2015-11-25 NOTE — Anesthesia Preprocedure Evaluation (Signed)
Anesthesia Evaluation  Patient identified by MRN, date of birth, ID band Patient awake    Reviewed: Allergy & Precautions, NPO status , Patient's Chart, lab work & pertinent test results  History of Anesthesia Complications Negative for: history of anesthetic complications  Airway Mallampati: II  TM Distance: >3 FB Neck ROM: Full  Mouth opening: Limited Mouth Opening  Dental  (+) Teeth Intact   Pulmonary former smoker,    breath sounds clear to auscultation       Cardiovascular negative cardio ROS   Rhythm:Regular Rate:Normal     Neuro/Psych    GI/Hepatic negative GI ROS, Neg liver ROS,   Endo/Other  Hypothyroidism   Renal/GU negative Renal ROS     Musculoskeletal  (+) Arthritis ,   Abdominal   Peds  Hematology   Anesthesia Other Findings   Reproductive/Obstetrics                             Anesthesia Physical Anesthesia Plan  ASA: II  Anesthesia Plan: General   Post-op Pain Management:    Induction: Intravenous  Airway Management Planned: Oral ETT  Additional Equipment:   Intra-op Plan:   Post-operative Plan: Extubation in OR  Informed Consent: I have reviewed the patients History and Physical, chart, labs and discussed the procedure including the risks, benefits and alternatives for the proposed anesthesia with the patient or authorized representative who has indicated his/her understanding and acceptance.   Dental advisory given  Plan Discussed with: CRNA and Surgeon  Anesthesia Plan Comments:         Anesthesia Quick Evaluation

## 2015-11-25 NOTE — Transfer of Care (Signed)
Immediate Anesthesia Transfer of Care Note  Patient: Alexandra Morrison  Procedure(s) Performed: Procedure(s): Posterior Cervical Fusion with lateral mass fixation Cervical fiv-Thoracic one, cervical laminectomy  - Cervical six-Cervical seven - Cervical seven-Thoracic one (N/A)  Patient Location: PACU  Anesthesia Type:General  Level of Consciousness: awake, alert , oriented and patient cooperative  Airway & Oxygen Therapy: Patient Spontanous Breathing and Patient connected to nasal cannula oxygen  Post-op Assessment: Report given to RN and Post -op Vital signs reviewed and stable  Post vital signs: Reviewed and stable  Last Vitals:  Filed Vitals:   11/25/15 0942 11/25/15 1308  BP: 101/43   Pulse: 68   Temp: 36.8 C 36.6 C  Resp: 20     Complications: No apparent anesthesia complications

## 2015-11-25 NOTE — H&P (Signed)
Subjective:   Patient is a 70 y.o. female admitted for cervical stenosis. The patient first presented to me with complaints of neck pain, arm pain, shooting pains in the arm(s) and numbness of the arm(s). Onset of symptoms was several months ago. The pain is described as aching and occurs all day. The pain is rated moderate, and is located  in the neck and radiates to the arms. The symptoms have been progressive. Symptoms are exacerbated by extending head backwards, and are relieved by none.  Previous work up includes MRI of cervical spine, results: spinal stenosis.  Past Medical History  Diagnosis Date  . Hypothyroidism   . Bipolar disorder ([place])   . History of sleep apnea     most recent study was negative for sleep apnea  . Arthritis   . Numbness and tingling in hands   . Wears glasses     Past Surgical History  Procedure Laterality Date  . Cesarean section  1971, 1973    x2  . Total shoulder arthroplasty Bilateral 2010, 2011  . Carpal tunnel release Bilateral   . Gastric bypass  2006  . Eye surgery Bilateral 2006    cataracts  . Appendectomy      No Known Allergies  Social History  Substance Use Topics  . Smoking status: Former Smoker    Quit date: 01/20/1993  . Smokeless tobacco: Not on file  . Alcohol Use: Yes     Comment: rarely    History reviewed. No pertinent family history. Prior to Admission medications   Medication Sig Start Date End Date Taking? Authorizing Provider  acetaminophen (TYLENOL) 500 MG tablet Take 500-1,000 mg by mouth every 8 (eight) hours as needed for mild pain or moderate pain.   Yes Historical Provider, MD  ALPRAZolam Duanne Moron) 0.5 MG tablet Take 0.5 mg by mouth 2 (two) times daily as needed for anxiety.   Yes Historical Provider, MD  amphetamine-dextroamphetamine (ADDERALL) 30 MG tablet Take 30 mg by mouth 2 (two) times daily.   Yes Historical Provider, MD  aspirin 81 MG tablet Take 81 mg by mouth daily.   Yes Historical Provider, MD  b complex  vitamins tablet Take 1 tablet by mouth every other day.   Yes Historical Provider, MD  Biotin 5000 MCG TABS Take 1 tablet by mouth daily.   Yes Historical Provider, MD  Calcium Carb-Cholecalciferol (CALCIUM 600 + D PO) Take 1 tablet by mouth 3 (three) times daily.   Yes Historical Provider, MD  Cholecalciferol (VITAMIN D3) 5000 units CAPS Take 1 capsule by mouth every other day.   Yes Historical Provider, MD  CINNAMON PO Take 2,000 mg by mouth daily.   Yes Historical Provider, MD  Coenzyme Q10 (CO Q 10) 100 MG CAPS Take 200 mg elemental calcium/kg/hr by mouth daily.   Yes Historical Provider, MD  ferrous sulfate 325 (65 FE) MG tablet Take 325 mg by mouth daily with breakfast.   Yes Historical Provider, MD  furosemide (LASIX) 20 MG tablet Take 20 mg by mouth daily as needed for fluid.   Yes Historical Provider, MD  lamoTRIgine (LAMICTAL) 25 MG tablet Take 50 mg by mouth daily.   Yes Historical Provider, MD  levothyroxine (SYNTHROID, LEVOTHROID) 50 MCG tablet Take 50 mcg by mouth daily before breakfast.   Yes Historical Provider, MD  Magnesium 250 MG TABS Take 1 tablet by mouth every other day.   Yes Historical Provider, MD  meloxicam (MOBIC) 7.5 MG tablet Take 7.5 mg by mouth daily  as needed for pain.   Yes Historical Provider, MD  Multiple Vitamins-Minerals (MULTIVITAMIN WITH MINERALS) tablet Take 1 tablet by mouth daily.   Yes Historical Provider, MD  niacin 500 MG tablet Take 500 mg by mouth daily.   Yes Historical Provider, MD  Omega-3 Fatty Acids (FISH OIL) 1000 MG CAPS Take 1 capsule by mouth daily.   Yes Historical Provider, MD  PARoxetine (PAXIL) 40 MG tablet Take 40 mg by mouth every morning.   Yes Historical Provider, MD  Plant Sterols and Stanols (CHOLEST OFF PO) Take 1 capsule by mouth 2 (two) times daily.   Yes Historical Provider, MD  Probiotic Product (PROBIOTIC DAILY PO) Take 1 capsule by mouth daily.   Yes Historical Provider, MD  traZODone (DESYREL) 50 MG tablet Take 25 mg by mouth  at bedtime as needed for sleep.   Yes Historical Provider, MD  Turmeric 500 MG CAPS Take 1 capsule by mouth 2 (two) times daily.   Yes Historical Provider, MD  vitamin B-12 (CYANOCOBALAMIN) 1000 MCG tablet Take 1,000 mcg by mouth every other day.   Yes Historical Provider, MD     Review of Systems  Positive ROS: neg  All other systems have been reviewed and were otherwise negative with the exception of those mentioned in the HPI and as above.  Objective: Vital signs in last 24 hours: Temp:  [98.2 F (36.8 C)] 98.2 F (36.8 C) (02/03 0942) Pulse Rate:  [68] 68 (02/03 0942) Resp:  [20] 20 (02/03 0942) BP: (101)/(43) 101/43 mmHg (02/03 0942) SpO2:  [100 %] 100 % (02/03 0942) Weight:  [59.875 kg (132 lb)] 59.875 kg (132 lb) (02/03 0940)  General Appearance: Alert, cooperative, no distress, appears stated age Head: Normocephalic, without obvious abnormality, atraumatic Eyes: PERRL, conjunctiva/corneas clear, EOM's intact      Neck: Supple, symmetrical, trachea midline, Back: Symmetric, no curvature, ROM normal, no CVA tenderness Lungs:  respirations unlabored Heart: Regular rate and rhythm Abdomen: Soft, non-tender Extremities: Extremities normal, atraumatic, no cyanosis or edema Pulses: 2+ and symmetric all extremities Skin: Skin color, texture, turgor normal, no rashes or lesions  NEUROLOGIC:  Mental status: Alert and oriented x4, no aphasia, good attention span, fund of knowledge and memory  Motor Exam - grossly normal Sensory Exam - grossly normal Reflexes: 1= Coordination - grossly normal Gait - grossly normal Balance - grossly normal Cranial Nerves: I: smell Not tested  II: visual acuity  OS: nl    OD: nl  II: visual fields Full to confrontation  II: pupils Equal, round, reactive to light  III,VII: ptosis None  III,IV,VI: extraocular muscles  Full ROM  V: mastication Normal  V: facial light touch sensation  Normal  V,VII: corneal reflex  Present  VII: facial  muscle function - upper  Normal  VII: facial muscle function - lower Normal  VIII: hearing Not tested  IX: soft palate elevation  Normal  IX,X: gag reflex Present  XI: trapezius strength  5/5  XI: sternocleidomastoid strength 5/5  XI: neck flexion strength  5/5  XII: tongue strength  Normal    Data Review Lab Results  Component Value Date   WBC 8.7 11/22/2015   HGB 11.6* 11/22/2015   HCT 34.6* 11/22/2015   MCV 94.3 11/22/2015   PLT 226 11/22/2015   Lab Results  Component Value Date   NA 143 11/22/2015   K 3.6 11/22/2015   CL 107 11/22/2015   CO2 27 11/22/2015   BUN 7 11/22/2015   CREATININE 0.72  11/22/2015   GLUCOSE 102* 11/22/2015   Lab Results  Component Value Date   INR 0.94 11/22/2015    Assessment:   Cervical neck pain with herniated nucleus pulposus/ spondylosis/ stenosis at C5-T1. Patient has failed conservative therapy. Planned surgery : Cervical laminectomy with posterior cervical fusion C5 -T1  Plan:   I explained the condition and procedure to the patient and answered any questions.  Patient wishes to proceed with procedure as planned. Understands risks/ benefits/ and expected or typical outcomes.  Milus Fritze S 11/25/2015 10:18 AM

## 2015-11-25 NOTE — Anesthesia Procedure Notes (Signed)
Procedure Name: Intubation Date/Time: 11/25/2015 10:48 AM Performed by: Salli Quarry Baltazar [place] Pre-anesthesia Checklist: Patient identified, Emergency Drugs available, Suction available and Patient being monitored Patient Re-evaluated:Patient Re-evaluated prior to inductionOxygen Delivery Method: Circle system utilized Preoxygenation: Pre-oxygenation with 100% oxygen Intubation Type: IV induction Ventilation: Mask ventilation without difficulty Laryngoscope Size: Mac and 3 Grade View: Grade I Tube type: Oral Tube size: 7.5 mm Number of attempts: 1 Airway Equipment and Method: Stylet Placement Confirmation: ETT inserted through vocal cords under direct vision,  positive ETCO2 and breath sounds checked- equal and bilateral Secured at: 22 cm Tube secured with: Tape Dental Injury: Teeth and Oropharynx as per pre-operative assessment

## 2015-11-25 NOTE — Progress Notes (Signed)
Utilization review completed.  

## 2015-11-25 NOTE — Anesthesia Postprocedure Evaluation (Signed)
Anesthesia Post Note  Patient: Alexandra Morrison  Procedure(s) Performed: Procedure(s) (LRB): Posterior Cervical Fusion with lateral mass fixation Cervical fiv-Thoracic one, cervical laminectomy  - Cervical six-Cervical seven - Cervical seven-Thoracic one (N/A)  Patient location during evaluation: PACU Anesthesia Type: General Level of consciousness: awake and alert Pain management: pain level controlled Vital Signs Assessment: post-procedure vital signs reviewed and stable Respiratory status: spontaneous breathing, nonlabored ventilation, respiratory function stable and patient connected to nasal cannula oxygen Cardiovascular status: blood pressure returned to baseline and stable Postop Assessment: no signs of nausea or vomiting Anesthetic complications: no    Last Vitals:  Filed Vitals:   11/25/15 1408 11/25/15 1415  BP: 133/63   Pulse: 84 84  Temp:    Resp: 16 13    Last Pain:  Filed Vitals:   11/25/15 1417  PainSc: Asleep                 Tiras Bianchini,JAMES TERRILL

## 2015-11-25 NOTE — Op Note (Signed)
11/25/2015  1:05 PM  PATIENT:  Alexandra Morrison  70 y.o. female  PRE-OPERATIVE DIAGNOSIS:  Cervical spondylosis with cervical spinal stenosis  POST-OPERATIVE DIAGNOSIS:  Same  PROCEDURE:  1. Decompressive cervical laminectomy C6 and C7 with foraminotomies C7-T1 bilaterally, 2. Posterior cervical fusion C5-T1 inclusive utilizing locally harvested morselized autologous bone graft, 3. Segmental lateral mass fixation C5-T1 utilizing nuvasive lateral mass screws at C5 and C6 and pedicle screws at T1  SURGEON:  Sherley Bounds, MD  ASSISTANTS: dr Kathyrn Sheriff  ANESTHESIA:   General  EBL: 20 ml  Total I/O In: 1000 [I.V.:1000] Out: 20 [Blood:20]  BLOOD ADMINISTERED:none  DRAINS: Medium Hemovac   SPECIMEN:  No Specimen  INDICATION FOR PROCEDURE: This patient presented with neck pain with arm pain with numbness and tingling in the arms. MRI and plain films showed severe spondylosis with stenosis. She tried medical management without relief. I recommended decompression and instrumented fusion from C5-T1. Patient understood the risks, benefits, and alternatives and potential outcomes and wished to proceed.  PROCEDURE DETAILS: The patient was brought to the operating room. Generalized endotracheal anesthesia was induced. The patient was affixed a 3 point [place] headrest and rolled into the prone position on chest rolls. All pressure points were padded. The posterior cervical region was cleaned and prepped with DuraPrep and then draped in the usual sterile fashion. 7 cc of local anesthesia was injected and a dorsal midline incision made in the posterior cervical region and carried down to the cervical fascia. The fascia was opened and the paraspinous musculature was taken down to expose C5-T1. Intraoperative fluoroscopy confirmed my level (this was checked by multiple people in the room) and then the dissection was carried out over the lateral facets. I localized the midpoint of each lateral mass of C5  and C6 bilaterally and marked a region 1 mm medial to the midpoint of the lateral mass, and then drilled in an upward and outward direction into the safe zone of each lateral mass. I drilled to a depth of 12 mm and then checked my drill hole with a ball probe. I then placed a 12 mm lateral mass screws into the safe zone of each lateral mass until they were 2 fingers tight. I then gently decompressed the central canal with the 1 and 2 mm Kerrison punch at C6 and C7. Medial facetectomies were performed, and foraminotomies were performed at C7-T1. I then localized the pedicle screw entry zone at T1 by visualizing the pedicle and the surface landmarks. I drilled to a depth of 18 mm and palpated with a ball probe. I then placed a 10 mm pedicle screws into T1 bilaterally. Once the decompression was complete the dura was full and capacious and I could see the spinal cord pulsatile through the dura. I then decorticated the lateral masses and the facet joints and packed them with local autograft and morcellized allograft to perform arthrodesis from C5-T1. I then placed rods into the multiaxial screw heads of the screws and locked these into position with the locking caps and anti-torque device. I then checked the final construct with AP/Lat fluoroscopy. Levels were confirmed by both physicians in the room as well as radiology. I irrigated with saline solution containing bacitracin. I placed a medium Hemovac drain through separate stab incision, and lined the dura with Gelfoam. After hemostasis was achieved I closed the muscle and the fascia with 0 Vicryl, subcutaneous tissue with 2-0 Vicryl, and the subcuticular tissue with 3-0 Vicryl. The skin was closed with benzoin  and Steri-Strips. A sterile dressing was applied, the patient was turned to the supine position and taken out of the headrest, awakened from general anesthesia and transferred to the recovery room in stable condition. At the end of the procedure all sponge,  needle and instrument counts were correct.   PLAN OF CARE: Admit to inpatient   PATIENT DISPOSITION:  PACU - hemodynamically stable.   Delay start of Pharmacological VTE agent (>24hrs) due to surgical blood loss or risk of bleeding:  yes

## 2015-11-26 NOTE — Progress Notes (Signed)
Subjective: Patient reports quite sore  Objective: Vital signs in last 24 hours: Temp:  [97.7 F (36.5 C)-98.9 F (37.2 C)] 98.2 F (36.8 C) (02/04 0635) Pulse Rate:  [66-89] 66 (02/04 0635) Resp:  [12-20] 18 (02/04 0635) BP: (91-134)/(40-77) 109/40 mmHg (02/04 0635) SpO2:  [92 %-100 %] 100 % (02/04 0635) Weight:  [59.875 kg (132 lb)] 59.875 kg (132 lb) (02/03 0940)  Intake/Output from previous day: 02/03 0701 - 02/04 0700 In: 1150 [I.V.:1150] Out: 70 [Drains:50; Blood:20] Intake/Output this shift:    Physical Exam: Strength full.  Dressing CDI.  Lab Results: No results for input(s): WBC, HGB, HCT, PLT in the last 72 hours. BMET No results for input(s): NA, K, CL, CO2, GLUCOSE, BUN, CREATININE, CALCIUM in the last 72 hours.  Studies/Results: Dg Cervical Spine 2-3 Views  11/25/2015  CLINICAL DATA:  C5 through T1 posterior cervical fusion. EXAM: DG C-ARM 61-120 MIN; CERVICAL SPINE - 2-3 VIEW COMPARISON:  None FINDINGS: Exam detail is diminished due to overlying soft tissue structures as well as shoulder arthroplasty device. Posterior screws and rods are noted extending from C5-C7. The alignment appears grossly hand. IMPRESSION: 1. Limited portable radiograph obtained in the operating room demonstrates screws and posterior rods from lower cervical spine posterior fixation. Electronically Signed   By: Kerby Moors M.D.   On: 11/25/2015 13:03   Dg C-arm 1-60 Min  11/25/2015  CLINICAL DATA:  C5 through T1 posterior cervical fusion. EXAM: DG C-ARM 61-120 MIN; CERVICAL SPINE - 2-3 VIEW COMPARISON:  None FINDINGS: Exam detail is diminished due to overlying soft tissue structures as well as shoulder arthroplasty device. Posterior screws and rods are noted extending from C5-C7. The alignment appears grossly hand. IMPRESSION: 1. Limited portable radiograph obtained in the operating room demonstrates screws and posterior rods from lower cervical spine posterior fixation. Electronically Signed    By: Kerby Moors M.D.   On: 11/25/2015 13:03    Assessment/Plan: Mobilize today.  Continue to manage pain.  Possible D/C in AM.    LOS: 1 day    Deondria Puryear D, MD 11/26/2015, 7:53 AM

## 2015-11-27 MED ORDER — OXYCODONE-ACETAMINOPHEN 5-325 MG PO TABS
1.0000 | ORAL_TABLET | ORAL | Status: DC | PRN
  Administered 2015-11-27 – 2015-11-29 (×10): 1 via ORAL
  Filled 2015-11-27 (×10): qty 1

## 2015-11-27 MED ORDER — OXYCODONE HCL 5 MG PO TABS
5.0000 mg | ORAL_TABLET | ORAL | Status: DC | PRN
Start: 2015-11-27 — End: 2015-11-29
  Administered 2015-11-27 – 2015-11-29 (×9): 5 mg via ORAL
  Filled 2015-11-27 (×9): qty 1

## 2015-11-27 MED ORDER — DIAZEPAM 5 MG PO TABS
5.0000 mg | ORAL_TABLET | Freq: Four times a day (QID) | ORAL | Status: DC | PRN
  Administered 2015-11-27: 5 mg via ORAL
  Filled 2015-11-27: qty 1

## 2015-11-27 NOTE — Progress Notes (Signed)
Pt began having posterior neck pain and upper chest pain, no radiation to arms.  EXAM:  BP 95/55 mmHg  Pulse 72  Temp(Src) 99 F (37.2 C) (Oral)  Resp 18  Ht 4\' 10"  (1.473 m)  Wt 59.875 kg (132 lb)  BMI 27.60 kg/m2  SpO2 94%  LMP  (LMP Unknown)  Awake, alert, oriented  Speech fluent, appropriate  CN grossly intact  5/5 BUE/BLE  Wound c/d/i  IMPRESSION:  70 y.o. female POD# 2 s/p pos cervical fusion, with neck/chest pain  PLAN: - Will increase oxycodone to 10mg  - Add valium

## 2015-11-28 MED ORDER — METHOCARBAMOL 500 MG PO TABS: 500.0000 mg | ORAL_TABLET | Freq: Four times a day (QID) | ORAL | Status: DC | PRN

## 2015-11-28 MED ORDER — OXYCODONE-ACETAMINOPHEN 5-325 MG PO TABS: 1.0000 | ORAL_TABLET | ORAL | Status: DC | PRN

## 2015-11-28 NOTE — Care Management Important Message (Signed)
Important Message  Patient Details  Name: KUM BARNWELL MRN: [id number] Date of Birth: 22-Oct-1946   Medicare Important Message Given:  Yes    Louanne Belton 11/28/2015, 11:45 AMImportant Message  Patient Details  Name: THAI GEBRE MRN: [id number] Date of Birth: March 31, 1946   Medicare Important Message Given:  Yes    Kasin Tonkinson G 11/28/2015, 11:45 AM

## 2015-11-28 NOTE — Discharge Summary (Signed)
Physician Discharge Summary  Patient ID: Alexandra Morrison MRN: [id number] DOB/AGE: 06/08/1946 70 y.o.  Admit date: 11/25/2015 Discharge date: 11/28/2015  Admission Diagnoses: cervical stenosis   Discharge Diagnoses: same   Discharged Condition: good  Hospital Course: The patient was admitted on 11/25/2015 and taken to the operating room where the patient underwent CL/ PCF. The patient tolerated the procedure well and was taken to the recovery room and then to the floor in stable condition. The hospital course was routine. There were no complications. The wound remained clean dry and intact. Pt had appropriate neck soreness. No complaints of arm pain or new N/T/W. The patient remained afebrile with stable vital signs, and tolerated a regular diet. The patient continued to increase activities, and pain was well controlled with oral pain medications.   Consults: None  Significant Diagnostic Studies:  Results for orders placed or performed during the hospital encounter of 11/22/15  Surgical pcr screen  Result Value Ref Range   MRSA, PCR NEGATIVE NEGATIVE   Staphylococcus aureus NEGATIVE NEGATIVE  Basic metabolic panel  Result Value Ref Range   Sodium 143 135 - 145 mmol/L   Potassium 3.6 3.5 - 5.1 mmol/L   Chloride 107 101 - 111 mmol/L   CO2 27 22 - 32 mmol/L   Glucose, Bld 102 (H) 65 - 99 mg/dL   BUN 7 6 - 20 mg/dL   Creatinine, Ser 0.72 0.44 - 1.00 mg/dL   Calcium 9.1 8.9 - 10.3 mg/dL   GFR calc non Af Amer >60 >60 mL/min   GFR calc Af Amer >60 >60 mL/min   Anion gap 9 5 - 15  CBC WITH DIFFERENTIAL  Result Value Ref Range   WBC 8.7 4.0 - 10.5 K/uL   RBC 3.67 (L) 3.87 - 5.11 MIL/uL   Hemoglobin 11.6 (L) 12.0 - 15.0 g/dL   HCT 34.6 (L) 36.0 - 46.0 %   MCV 94.3 78.0 - 100.0 fL   MCH 31.6 26.0 - 34.0 pg   MCHC 33.5 30.0 - 36.0 g/dL   RDW 13.4 11.5 - 15.5 %   Platelets 226 150 - 400 K/uL   Neutrophils Relative % 62 %   Neutro Abs 5.4 1.7 - 7.7 K/uL   Lymphocytes Relative 30 %    Lymphs Abs 2.6 0.7 - 4.0 K/uL   Monocytes Relative 6 %   Monocytes Absolute 0.6 0.1 - 1.0 K/uL   Eosinophils Relative 2 %   Eosinophils Absolute 0.2 0.0 - 0.7 K/uL   Basophils Relative 0 %   Basophils Absolute 0.0 0.0 - 0.1 K/uL  Protime-INR  Result Value Ref Range   Prothrombin Time 12.7 11.6 - 15.2 seconds   INR 0.94 0.00 - 1.49  Type and screen All Cardiac and thoracic surgeries, spinal fusions, myomectomies, craniotomies, colon & liver resections, total joint revisions, same day c-section with placenta previa or accreta.  Result Value Ref Range   ABO/RH(D) A POS    Antibody Screen NEG    Sample Expiration 12/06/2015    Extend sample reason NO TRANSFUSIONS OR PREGNANCY IN THE PAST 3 MONTHS   ABO/Rh  Result Value Ref Range   ABO/RH(D) A POS     Chest 2 View  11/22/2015  CLINICAL DATA:  Cervical spinal canal stenosis. Pre-op respiratory exam EXAM: CHEST  2 VIEW COMPARISON:  None. FINDINGS: The heart size and mediastinal contours are within normal limits. Both lungs are clear. No evidence of pneumothorax or pleural effusion. Thoracolumbar spine degenerative changes and S-shaped scoliosis  noted. Bilateral shoulder prostheses also noted. IMPRESSION: No active cardiopulmonary disease. Electronically Signed   By: Earle Gell M.D.   On: 11/22/2015 14:17   Dg Cervical Spine 2-3 Views  11/25/2015  CLINICAL DATA:  C5 through T1 posterior cervical fusion. EXAM: DG C-ARM 61-120 MIN; CERVICAL SPINE - 2-3 VIEW COMPARISON:  None FINDINGS: Exam detail is diminished due to overlying soft tissue structures as well as shoulder arthroplasty device. Posterior screws and rods are noted extending from C5-C7. The alignment appears grossly hand. IMPRESSION: 1. Limited portable radiograph obtained in the operating room demonstrates screws and posterior rods from lower cervical spine posterior fixation. Electronically Signed   By: Kerby Moors M.D.   On: 11/25/2015 13:03   Dg C-arm 1-60 Min  11/25/2015   CLINICAL DATA:  C5 through T1 posterior cervical fusion. EXAM: DG C-ARM 61-120 MIN; CERVICAL SPINE - 2-3 VIEW COMPARISON:  None FINDINGS: Exam detail is diminished due to overlying soft tissue structures as well as shoulder arthroplasty device. Posterior screws and rods are noted extending from C5-C7. The alignment appears grossly hand. IMPRESSION: 1. Limited portable radiograph obtained in the operating room demonstrates screws and posterior rods from lower cervical spine posterior fixation. Electronically Signed   By: Kerby Moors M.D.   On: 11/25/2015 13:03    Antibiotics:  Anti-infectives    Start     Dose/Rate Route Frequency Ordered Stop   11/25/15 1900  ceFAZolin (ANCEF) IVPB 1 g/50 mL premix     1 g 100 mL/hr over 30 Minutes Intravenous Every 8 hours 11/25/15 1554 11/26/15 0512   11/25/15 1234  vancomycin (VANCOCIN) powder  Status:  Discontinued       As needed 11/25/15 1244 11/25/15 1304   11/25/15 1139  bacitracin 50,000 Units in sodium chloride irrigation 0.9 % 500 mL irrigation  Status:  Discontinued       As needed 11/25/15 1140 11/25/15 1304   11/25/15 1028  vancomycin (VANCOCIN) 1000 MG powder    Comments:  Carroll Kinds   : cabinet override      11/25/15 1028 11/25/15 2244   11/25/15 0919  ceFAZolin (ANCEF) IVPB 2 g/50 mL premix     2 g 100 mL/hr over 30 Minutes Intravenous On call to O.R. 11/25/15 0919 11/25/15 1112      Discharge Exam: Blood pressure 99/36, pulse 70, temperature 98.4 F (36.9 C), temperature source Oral, resp. rate 16, height 4\' 10"  (1.473 m), weight 59.875 kg (132 lb), SpO2 93 %. Neurologic: Grossly normal incison CDi  Discharge Medications:     Medication List    TAKE these medications        acetaminophen 500 MG tablet  Commonly known as:  TYLENOL  Take 500-1,000 mg by mouth every 8 (eight) hours as needed for mild pain or moderate pain.     ALPRAZolam 0.5 MG tablet  Commonly known as:  XANAX  Take 0.5 mg by mouth 2 (two) times daily  as needed for anxiety.     amphetamine-dextroamphetamine 30 MG tablet  Commonly known as:  ADDERALL  Take 30 mg by mouth 2 (two) times daily.     aspirin 81 MG tablet  Take 81 mg by mouth daily.     b complex vitamins tablet  Take 1 tablet by mouth every other day.     Biotin 5000 MCG Tabs  Take 1 tablet by mouth daily.     CALCIUM 600 + D PO  Take 1 tablet by mouth 3 (three) times  daily.     CHOLEST OFF PO  Take 1 capsule by mouth 2 (two) times daily.     CINNAMON PO  Take 2,000 mg by mouth daily.     Co Q 10 100 MG Caps  Take 200 mg elemental calcium/kg/hr by mouth daily.     ferrous sulfate 325 (65 FE) MG tablet  Take 325 mg by mouth daily with breakfast.     Fish Oil 1000 MG Caps  Take 1 capsule by mouth daily.     furosemide 20 MG tablet  Commonly known as:  LASIX  Take 20 mg by mouth daily as needed for fluid.     lamoTRIgine 25 MG tablet  Commonly known as:  LAMICTAL  Take 50 mg by mouth daily.     levothyroxine 50 MCG tablet  Commonly known as:  SYNTHROID, LEVOTHROID  Take 50 mcg by mouth daily before breakfast.     Magnesium 250 MG Tabs  Take 1 tablet by mouth every other day.     meloxicam 7.5 MG tablet  Commonly known as:  MOBIC  Take 7.5 mg by mouth daily as needed for pain.     methocarbamol 500 MG tablet  Commonly known as:  ROBAXIN  Take 1 tablet (500 mg total) by mouth every 6 (six) hours as needed for muscle spasms.     multivitamin with minerals tablet  Take 1 tablet by mouth daily.     niacin 500 MG tablet  Take 500 mg by mouth daily.     oxyCODONE-acetaminophen 5-325 MG tablet  Commonly known as:  PERCOCET/ROXICET  Take 1 tablet by mouth every 4 (four) hours as needed for moderate pain.     PARoxetine 40 MG tablet  Commonly known as:  PAXIL  Take 40 mg by mouth every morning.     PROBIOTIC DAILY PO  Take 1 capsule by mouth daily.     traZODone 50 MG tablet  Commonly known as:  DESYREL  Take 25 mg by mouth at bedtime as  needed for sleep.     Turmeric 500 MG Caps  Take 1 capsule by mouth 2 (two) times daily.     vitamin B-12 1000 MCG tablet  Commonly known as:  CYANOCOBALAMIN  Take 1,000 mcg by mouth every other day.     Vitamin D3 5000 units Caps  Take 1 capsule by mouth every other day.        Disposition: home   Final Dx: CL/ PCF      Discharge Instructions    Call MD for:  difficulty breathing, headache or visual disturbances    Complete by:  As directed      Call MD for:  persistant nausea and vomiting    Complete by:  As directed      Call MD for:  redness, tenderness, or signs of infection (pain, swelling, redness, odor or green/yellow discharge around incision site)    Complete by:  As directed      Call MD for:  severe uncontrolled pain    Complete by:  As directed      Call MD for:  temperature >100.4    Complete by:  As directed      Diet - low sodium heart healthy    Complete by:  As directed      Discharge instructions    Complete by:  As directed   May shower, no heavy lifting or driving     Increase activity slowly  Complete by:  As directed      No wound care    Complete by:  As directed            Follow-up Information    Follow up with Youssouf Shipley S, MD. Schedule an appointment as soon as possible for a visit in 2 weeks.   Specialty:  Neurosurgery   Contact information:   [address] [place] [place] [address] [phone]        Signed: Eustace Moore 11/28/2015, 7:24 PM

## 2015-11-28 NOTE — Care Management Note (Signed)
Case Management Note  Patient Details  Name: Alexandra Morrison MRN: [id number] Date of Birth: 05-Dec-1945  Subjective/Objective:                    Action/Plan: Patient was admitted for a Posterior Cervical Fusion with lateral mass fixation Cervical fiv-Thoracic one, cervical laminectomy - Cervical six-Cervical seven - Cervical seven-Thoracic one.  Lives at home with spouse. Will follow for discharge needs pending PT/OT evals and physician orders.  Expected Discharge Date:  11/27/15               Expected Discharge Plan:     In-House Referral:     Discharge planning Services     Post Acute Care Choice:    Choice offered to:     DME Arranged:    DME Agency:     HH Arranged:    HH Agency:     Status of Service:  In process, will continue to follow  Medicare Important Message Given:  Yes Date Medicare IM Given:    Medicare IM give by:    Date Additional Medicare IM Given:    Additional Medicare Important Message give by:     If discussed at [facility] of Stay Meetings, dates discussed:    Additional CommentsRolm Baptise, RN 11/28/2015, 4:16 PM [phone]

## 2015-11-29 ENCOUNTER — Encounter (HOSPITAL_COMMUNITY): Payer: Self-pay | Admitting: Neurological Surgery

## 2015-11-29 NOTE — Care Management Note (Signed)
Case Management Note  Patient Details  Name: Alexandra Morrison MRN: [id number] Date of Birth: [birth year]  Subjective/Objective:                    Action/Plan: Plan is for patient to discharge home with self care. No further needs per CM.   Expected Discharge Date:  11/27/15               Expected Discharge Plan:  Home/Self Care  In-House Referral:     Discharge planning Services     Post Acute Care Choice:    Choice offered to:     DME Arranged:    DME Agency:     HH Arranged:    HH Agency:     Status of Service:  Completed, signed off  Medicare Important Message Given:  Yes Date Medicare IM Given:    Medicare IM give by:    Date Additional Medicare IM Given:    Additional Medicare Important Message give by:     If discussed at [place] of Stay Meetings, dates discussed:    Additional Comments:  Pollie Friar, RN 11/29/2015, 8:50 AM

## 2015-11-29 NOTE — Progress Notes (Signed)
Discharge orders received, Pt for discharge home today.. IV d/c'd. D/c instructions and RX given with verbalized understanding. Family at bedside to assist patient with discharge. Staff bought pt downstairs via wheelchair. 11/29/15 1036

## 2015-12-15 DIAGNOSIS — F3110 Bipolar disorder, current episode manic without psychotic features, unspecified: Secondary | ICD-10-CM | POA: Diagnosis not present

## 2016-01-16 DIAGNOSIS — M4802 Spinal stenosis, cervical region: Secondary | ICD-10-CM | POA: Diagnosis not present

## 2016-01-16 DIAGNOSIS — M4313 Spondylolisthesis, cervicothoracic region: Secondary | ICD-10-CM | POA: Diagnosis not present

## 2016-02-29 DIAGNOSIS — G959 Disease of spinal cord, unspecified: Secondary | ICD-10-CM | POA: Diagnosis not present

## 2016-03-08 DIAGNOSIS — F3110 Bipolar disorder, current episode manic without psychotic features, unspecified: Secondary | ICD-10-CM | POA: Diagnosis not present

## 2016-03-27 DIAGNOSIS — E039 Hypothyroidism, unspecified: Secondary | ICD-10-CM | POA: Diagnosis not present

## 2016-03-27 DIAGNOSIS — Z13820 Encounter for screening for osteoporosis: Secondary | ICD-10-CM | POA: Diagnosis not present

## 2016-03-27 DIAGNOSIS — R600 Localized edema: Secondary | ICD-10-CM | POA: Diagnosis not present

## 2016-03-27 DIAGNOSIS — M25572 Pain in left ankle and joints of left foot: Secondary | ICD-10-CM | POA: Diagnosis not present

## 2016-03-27 DIAGNOSIS — M159 Polyosteoarthritis, unspecified: Secondary | ICD-10-CM | POA: Diagnosis not present

## 2016-03-27 DIAGNOSIS — E119 Type 2 diabetes mellitus without complications: Secondary | ICD-10-CM | POA: Diagnosis not present

## 2016-03-27 DIAGNOSIS — Z1231 Encounter for screening mammogram for malignant neoplasm of breast: Secondary | ICD-10-CM | POA: Diagnosis not present

## 2016-03-28 ENCOUNTER — Other Ambulatory Visit: Payer: Self-pay | Admitting: Family Medicine

## 2016-03-28 DIAGNOSIS — Z1231 Encounter for screening mammogram for malignant neoplasm of breast: Secondary | ICD-10-CM

## 2016-04-05 DIAGNOSIS — F3110 Bipolar disorder, current episode manic without psychotic features, unspecified: Secondary | ICD-10-CM | POA: Diagnosis not present

## 2016-04-09 DIAGNOSIS — F3110 Bipolar disorder, current episode manic without psychotic features, unspecified: Secondary | ICD-10-CM | POA: Diagnosis not present

## 2016-04-16 DIAGNOSIS — M4802 Spinal stenosis, cervical region: Secondary | ICD-10-CM | POA: Diagnosis not present

## 2016-04-16 DIAGNOSIS — M4313 Spondylolisthesis, cervicothoracic region: Secondary | ICD-10-CM | POA: Diagnosis not present

## 2016-04-26 DIAGNOSIS — F3110 Bipolar disorder, current episode manic without psychotic features, unspecified: Secondary | ICD-10-CM | POA: Diagnosis not present

## 2016-05-03 DIAGNOSIS — M4802 Spinal stenosis, cervical region: Secondary | ICD-10-CM | POA: Diagnosis not present

## 2016-05-03 DIAGNOSIS — Z6827 Body mass index (BMI) 27.0-27.9, adult: Secondary | ICD-10-CM | POA: Diagnosis not present

## 2016-05-14 ENCOUNTER — Ambulatory Visit
Admission: RE | Admit: 2016-05-14 | Discharge: 2016-05-14 | Disposition: A | Discharge status: Home / Self Care | Payer: Medicare Other | Source: Ambulatory Visit | Attending: Family Medicine | Admitting: Family Medicine

## 2016-05-14 DIAGNOSIS — Z1231 Encounter for screening mammogram for malignant neoplasm of breast: Secondary | ICD-10-CM

## 2016-05-14 DIAGNOSIS — M810 Age-related osteoporosis without current pathological fracture: Secondary | ICD-10-CM | POA: Diagnosis not present

## 2016-05-14 DIAGNOSIS — M8588 Other specified disorders of bone density and structure, other site: Secondary | ICD-10-CM | POA: Diagnosis not present

## 2016-05-21 DIAGNOSIS — F3110 Bipolar disorder, current episode manic without psychotic features, unspecified: Secondary | ICD-10-CM | POA: Diagnosis not present

## 2016-06-07 DIAGNOSIS — F3110 Bipolar disorder, current episode manic without psychotic features, unspecified: Secondary | ICD-10-CM | POA: Diagnosis not present

## 2016-07-02 DIAGNOSIS — F3110 Bipolar disorder, current episode manic without psychotic features, unspecified: Secondary | ICD-10-CM | POA: Diagnosis not present

## 2016-07-05 DIAGNOSIS — F3110 Bipolar disorder, current episode manic without psychotic features, unspecified: Secondary | ICD-10-CM | POA: Diagnosis not present

## 2016-07-13 DIAGNOSIS — M159 Polyosteoarthritis, unspecified: Secondary | ICD-10-CM | POA: Diagnosis not present

## 2016-07-13 DIAGNOSIS — M810 Age-related osteoporosis without current pathological fracture: Secondary | ICD-10-CM | POA: Diagnosis not present

## 2016-07-16 DIAGNOSIS — F3110 Bipolar disorder, current episode manic without psychotic features, unspecified: Secondary | ICD-10-CM | POA: Diagnosis not present

## 2016-08-06 DIAGNOSIS — F3110 Bipolar disorder, current episode manic without psychotic features, unspecified: Secondary | ICD-10-CM | POA: Diagnosis not present

## 2016-08-27 DIAGNOSIS — F3110 Bipolar disorder, current episode manic without psychotic features, unspecified: Secondary | ICD-10-CM | POA: Diagnosis not present

## 2016-09-17 DIAGNOSIS — F3110 Bipolar disorder, current episode manic without psychotic features, unspecified: Secondary | ICD-10-CM | POA: Diagnosis not present

## 2016-09-26 DIAGNOSIS — Z0000 Encounter for general adult medical examination without abnormal findings: Secondary | ICD-10-CM | POA: Diagnosis not present

## 2016-09-26 DIAGNOSIS — R600 Localized edema: Secondary | ICD-10-CM | POA: Diagnosis not present

## 2016-09-26 DIAGNOSIS — M159 Polyosteoarthritis, unspecified: Secondary | ICD-10-CM | POA: Diagnosis not present

## 2016-09-26 DIAGNOSIS — M810 Age-related osteoporosis without current pathological fracture: Secondary | ICD-10-CM | POA: Diagnosis not present

## 2016-09-26 DIAGNOSIS — E039 Hypothyroidism, unspecified: Secondary | ICD-10-CM | POA: Diagnosis not present

## 2016-09-26 DIAGNOSIS — E119 Type 2 diabetes mellitus without complications: Secondary | ICD-10-CM | POA: Diagnosis not present

## 2016-09-26 DIAGNOSIS — E785 Hyperlipidemia, unspecified: Secondary | ICD-10-CM | POA: Diagnosis not present

## 2016-10-04 DIAGNOSIS — F3110 Bipolar disorder, current episode manic without psychotic features, unspecified: Secondary | ICD-10-CM | POA: Diagnosis not present

## 2016-11-05 DIAGNOSIS — F3110 Bipolar disorder, current episode manic without psychotic features, unspecified: Secondary | ICD-10-CM | POA: Diagnosis not present

## 2016-12-24 DIAGNOSIS — M542 Cervicalgia: Secondary | ICD-10-CM | POA: Diagnosis not present

## 2016-12-25 ENCOUNTER — Other Ambulatory Visit: Payer: Self-pay | Admitting: Neurological Surgery

## 2016-12-25 DIAGNOSIS — M542 Cervicalgia: Secondary | ICD-10-CM

## 2016-12-27 DIAGNOSIS — F3110 Bipolar disorder, current episode manic without psychotic features, unspecified: Secondary | ICD-10-CM | POA: Diagnosis not present

## 2017-01-10 ENCOUNTER — Ambulatory Visit
Admission: RE | Admit: 2017-01-10 | Discharge: 2017-01-10 | Disposition: A | Discharge status: Home / Self Care | Payer: Medicare Other | Source: Ambulatory Visit | Attending: Neurological Surgery | Admitting: Neurological Surgery

## 2017-01-10 DIAGNOSIS — M542 Cervicalgia: Secondary | ICD-10-CM | POA: Diagnosis not present

## 2017-01-11 DIAGNOSIS — M542 Cervicalgia: Secondary | ICD-10-CM | POA: Diagnosis not present

## 2017-01-11 DIAGNOSIS — Z6827 Body mass index (BMI) 27.0-27.9, adult: Secondary | ICD-10-CM | POA: Diagnosis not present

## 2017-01-11 DIAGNOSIS — R030 Elevated blood-pressure reading, without diagnosis of hypertension: Secondary | ICD-10-CM | POA: Diagnosis not present

## 2017-01-17 DIAGNOSIS — M542 Cervicalgia: Secondary | ICD-10-CM | POA: Diagnosis not present

## 2017-01-21 DIAGNOSIS — M542 Cervicalgia: Secondary | ICD-10-CM | POA: Diagnosis not present

## 2017-01-24 DIAGNOSIS — M542 Cervicalgia: Secondary | ICD-10-CM | POA: Diagnosis not present

## 2017-01-28 DIAGNOSIS — M542 Cervicalgia: Secondary | ICD-10-CM | POA: Diagnosis not present

## 2017-01-31 DIAGNOSIS — M542 Cervicalgia: Secondary | ICD-10-CM | POA: Diagnosis not present

## 2017-02-04 DIAGNOSIS — M542 Cervicalgia: Secondary | ICD-10-CM | POA: Diagnosis not present

## 2017-02-06 DIAGNOSIS — M542 Cervicalgia: Secondary | ICD-10-CM | POA: Diagnosis not present

## 2017-02-07 DIAGNOSIS — M47812 Spondylosis without myelopathy or radiculopathy, cervical region: Secondary | ICD-10-CM | POA: Diagnosis not present

## 2017-02-11 DIAGNOSIS — M542 Cervicalgia: Secondary | ICD-10-CM | POA: Diagnosis not present

## 2017-03-21 DIAGNOSIS — M47812 Spondylosis without myelopathy or radiculopathy, cervical region: Secondary | ICD-10-CM | POA: Diagnosis not present

## 2017-03-28 DIAGNOSIS — F3110 Bipolar disorder, current episode manic without psychotic features, unspecified: Secondary | ICD-10-CM | POA: Diagnosis not present

## 2017-03-28 DIAGNOSIS — E039 Hypothyroidism, unspecified: Secondary | ICD-10-CM | POA: Diagnosis not present

## 2017-03-28 DIAGNOSIS — M810 Age-related osteoporosis without current pathological fracture: Secondary | ICD-10-CM | POA: Diagnosis not present

## 2017-03-28 DIAGNOSIS — E785 Hyperlipidemia, unspecified: Secondary | ICD-10-CM | POA: Diagnosis not present

## 2017-03-28 DIAGNOSIS — R600 Localized edema: Secondary | ICD-10-CM | POA: Diagnosis not present

## 2017-03-28 DIAGNOSIS — E119 Type 2 diabetes mellitus without complications: Secondary | ICD-10-CM | POA: Diagnosis not present

## 2017-03-28 DIAGNOSIS — M159 Polyosteoarthritis, unspecified: Secondary | ICD-10-CM | POA: Diagnosis not present

## 2017-03-28 DIAGNOSIS — M25569 Pain in unspecified knee: Secondary | ICD-10-CM | POA: Diagnosis not present

## 2017-04-01 DIAGNOSIS — G8929 Other chronic pain: Secondary | ICD-10-CM | POA: Diagnosis not present

## 2017-04-01 DIAGNOSIS — M25561 Pain in right knee: Secondary | ICD-10-CM | POA: Diagnosis not present

## 2017-04-01 DIAGNOSIS — M170 Bilateral primary osteoarthritis of knee: Secondary | ICD-10-CM | POA: Diagnosis not present

## 2017-04-09 DIAGNOSIS — Z6826 Body mass index (BMI) 26.0-26.9, adult: Secondary | ICD-10-CM | POA: Diagnosis not present

## 2017-04-09 DIAGNOSIS — M542 Cervicalgia: Secondary | ICD-10-CM | POA: Diagnosis not present

## 2017-04-09 DIAGNOSIS — R030 Elevated blood-pressure reading, without diagnosis of hypertension: Secondary | ICD-10-CM | POA: Diagnosis not present

## 2017-04-09 DIAGNOSIS — M47812 Spondylosis without myelopathy or radiculopathy, cervical region: Secondary | ICD-10-CM | POA: Diagnosis not present

## 2017-05-02 DIAGNOSIS — M47812 Spondylosis without myelopathy or radiculopathy, cervical region: Secondary | ICD-10-CM | POA: Diagnosis not present

## 2017-05-09 DIAGNOSIS — M47812 Spondylosis without myelopathy or radiculopathy, cervical region: Secondary | ICD-10-CM | POA: Diagnosis not present

## 2017-05-28 DIAGNOSIS — M170 Bilateral primary osteoarthritis of knee: Secondary | ICD-10-CM | POA: Diagnosis not present

## 2017-06-18 DIAGNOSIS — M1711 Unilateral primary osteoarthritis, right knee: Secondary | ICD-10-CM | POA: Diagnosis not present

## 2017-06-26 ENCOUNTER — Ambulatory Visit: Payer: Self-pay | Admitting: Orthopedic Surgery

## 2017-06-27 DIAGNOSIS — F3110 Bipolar disorder, current episode manic without psychotic features, unspecified: Secondary | ICD-10-CM | POA: Diagnosis not present

## 2017-07-01 NOTE — Patient Instructions (Addendum)
Alexandra Morrison  07/01/2017   Your procedure is scheduled on: 07-11-17  Report to [facility] Main  Entrance Take [place] Elevators to Short Stay on 3rd Floor at 5:30 AM   Call this number if you have problems the morning of surgery [phone]    Remember: ONLY 1 PERSON MAY GO WITH YOU TO SHORT STAY TO GET  READY MORNING OF YOUR SURGERY.  Do not eat food or drink liquids :After Midnight.     Take these medicines the morning of surgery with A SIP OF WATER: Depakote ER, Levothyroxine (Synthroid), Paroxetine (Paxil). You may also use your Xanax as needed.                                You may not have any metal on your body including hair pins and              piercings  Do not wear jewelry, make-up, lotions, powders or perfumes, deodorant             Do not wear nail polish.  Do not shave  48 hours prior to surgery.                Do not bring valuables to the hospital. [place].  Contacts, dentures or bridgework may not be worn into surgery.  Leave suitcase in the car. After surgery it may be brought to your room.                 Please read over the following fact sheets you were given: _____________________________________________________________________             [facility] - Preparing for Surgery Before surgery, you can play an important role.  Because skin is not sterile, your skin needs to be as free of germs as possible.  You can reduce the number of germs on your skin by washing with CHG (chlorahexidine gluconate) soap before surgery.  CHG is an antiseptic cleaner which kills germs and bonds with the skin to continue killing germs even after washing. Please DO NOT use if you have an allergy to CHG or antibacterial soaps.  If your skin becomes reddened/irritated stop using the CHG and inform your nurse when you arrive at Short Stay. Do not shave (including legs and underarms) for at least 48 hours  prior to the first CHG shower.  You may shave your face/neck. Please follow these instructions carefully:  1.  Shower with CHG Soap the night before surgery and the  morning of Surgery.  2.  If you choose to wash your hair, wash your hair first as usual with your  normal  shampoo.  3.  After you shampoo, rinse your hair and body thoroughly to remove the  shampoo.                           4.  Use CHG as you would any other liquid soap.  You can apply chg directly  to the skin and wash                       Gently with a scrungie or clean washcloth.  5.  Apply the CHG Soap to your body ONLY FROM THE NECK DOWN.   Do not use on face/ open                           Wound or open sores. Avoid contact with eyes, ears mouth and genitals (private parts).                       Wash face,  Genitals (private parts) with your normal soap.             6.  Wash thoroughly, paying special attention to the area where your surgery  will be performed.  7.  Thoroughly rinse your body with warm water from the neck down.  8.  DO NOT shower/wash with your normal soap after using and rinsing off  the CHG Soap.                9.  Pat yourself dry with a clean towel.            10.  Wear clean pajamas.            11.  Place clean sheets on your bed the night of your first shower and do not  sleep with pets. Day of Surgery : Do not apply any lotions/deodorants the morning of surgery.  Please wear clean clothes to the hospital/surgery center.  FAILURE TO FOLLOW THESE INSTRUCTIONS MAY RESULT IN THE CANCELLATION OF YOUR SURGERY PATIENT SIGNATURE_________________________________  NURSE SIGNATURE__________________________________  ________________________________________________________________________   Adam Phenix  An incentive spirometer is a tool that can help keep your lungs clear and active. This tool measures how well you are filling your lungs with each breath. Taking long deep breaths may help reverse  or decrease the chance of developing breathing (pulmonary) problems (especially infection) following:  A long period of time when you are unable to move or be active. BEFORE THE PROCEDURE   If the spirometer includes an indicator to show your best effort, your nurse or respiratory therapist will set it to a desired goal.  If possible, sit up straight or lean slightly forward. Try not to slouch.  Hold the incentive spirometer in an upright position. INSTRUCTIONS FOR USE  1. Sit on the edge of your bed if possible, or sit up as far as you can in bed or on a chair. 2. Hold the incentive spirometer in an upright position. 3. Breathe out normally. 4. Place the mouthpiece in your mouth and seal your lips tightly around it. 5. Breathe in slowly and as deeply as possible, raising the piston or the ball toward the top of the column. 6. Hold your breath for 3-5 seconds or for as long as possible. Allow the piston or ball to fall to the bottom of the column. 7. Remove the mouthpiece from your mouth and breathe out normally. 8. Rest for a few seconds and repeat Steps 1 through 7 at least 10 times every 1-2 hours when you are awake. Take your time and take a few normal breaths between deep breaths. 9. The spirometer may include an indicator to show your best effort. Use the indicator as a goal to work toward during each repetition. 10. After each set of 10 deep breaths, practice coughing to be sure your lungs are clear. If you have an incision (the cut made at the time of surgery), support your incision when coughing by placing a pillow or  rolled up towels firmly against it. Once you are able to get out of bed, walk around indoors and cough well. You may stop using the incentive spirometer when instructed by your caregiver.  RISKS AND COMPLICATIONS  Take your time so you do not get dizzy or light-headed.  If you are in pain, you may need to take or ask for pain medication before doing incentive  spirometry. It is harder to take a deep breath if you are having pain. AFTER USE  Rest and breathe slowly and easily.  It can be helpful to keep track of a log of your progress. Your caregiver can provide you with a simple table to help with this. If you are using the spirometer at home, follow these instructions: [place] IF:   You are having difficultly using the spirometer.  You have trouble using the spirometer as often as instructed.  Your pain medication is not giving enough relief while using the spirometer.  You develop fever of 100.5 F (38.1 C) or higher. SEEK IMMEDIATE MEDICAL CARE IF:   You cough up bloody sputum that had not been present before.  You develop fever of 102 F (38.9 C) or greater.  You develop worsening pain at or near the incision site. MAKE SURE YOU:   Understand these instructions.  Will watch your condition.  Will get help right away if you are not doing well or get worse. Document Released: 02/18/2007 Document Revised: 12/31/2011 Document Reviewed: 04/21/2007 ExitCare Patient Information 2014 ExitCare, [place].   ________________________________________________________________________  WHAT IS A BLOOD TRANSFUSION? Blood Transfusion Information  A transfusion is the replacement of blood or some of its parts. Blood is made up of multiple cells which provide different functions.  Red blood cells carry oxygen and are used for blood loss replacement.  White blood cells fight against infection.  Platelets control bleeding.  Plasma helps clot blood.  Other blood products are available for specialized needs, such as hemophilia or other clotting disorders. BEFORE THE TRANSFUSION  Who gives blood for transfusions?   Healthy volunteers who are fully evaluated to make sure their blood is safe. This is blood bank blood. Transfusion therapy is the safest it has ever been in the practice of medicine. Before blood is taken from a donor, a  complete history is taken to make sure that person has no history of diseases nor engages in risky social behavior (examples are intravenous drug use or sexual activity with multiple partners). The donor's travel history is screened to minimize risk of transmitting infections, such as malaria. The donated blood is tested for signs of infectious diseases, such as HIV and hepatitis. The blood is then tested to be sure it is compatible with you in order to minimize the chance of a transfusion reaction. If you or a relative donates blood, this is often done in anticipation of surgery and is not appropriate for emergency situations. It takes many days to process the donated blood. RISKS AND COMPLICATIONS Although transfusion therapy is very safe and saves many lives, the main dangers of transfusion include:   Getting an infectious disease.  Developing a transfusion reaction. This is an allergic reaction to something in the blood you were given. Every precaution is taken to prevent this. The decision to have a blood transfusion has been considered carefully by your caregiver before blood is given. Blood is not given unless the benefits outweigh the risks. AFTER THE TRANSFUSION  Right after receiving a blood transfusion, you will usually  feel much better and more energetic. This is especially true if your red blood cells have gotten low (anemic). The transfusion raises the level of the red blood cells which carry oxygen, and this usually causes an energy increase.  The nurse administering the transfusion will monitor you carefully for complications. HOME CARE INSTRUCTIONS  No special instructions are needed after a transfusion. You may find your energy is better. Speak with your caregiver about any limitations on activity for underlying diseases you may have. SEEK MEDICAL CARE IF:   Your condition is not improving after your transfusion.  You develop redness or irritation at the intravenous (IV)  site. SEEK IMMEDIATE MEDICAL CARE IF:  Any of the following symptoms occur over the next 12 hours:  Shaking chills.  You have a temperature by mouth above 102 F (38.9 C), not controlled by medicine.  Chest, back, or muscle pain.  People around you feel you are not acting correctly or are confused.  Shortness of breath or difficulty breathing.  Dizziness and fainting.  You get a rash or develop hives.  You have a decrease in urine output.  Your urine turns a dark color or changes to pink, red, or brown. Any of the following symptoms occur over the next 10 days:  You have a temperature by mouth above 102 F (38.9 C), not controlled by medicine.  Shortness of breath.  Weakness after normal activity.  The white part of the eye turns yellow (jaundice).  You have a decrease in the amount of urine or are urinating less often.  Your urine turns a dark color or changes to pink, red, or brown. Document Released: 10/05/2000 Document Revised: 12/31/2011 Document Reviewed: 05/24/2008 [facility] Patient Information 2014 [place], [place].  _______________________________________________________________________

## 2017-07-02 ENCOUNTER — Encounter (HOSPITAL_COMMUNITY)
Admission: RE | Admit: 2017-07-02 | Discharge: 2017-07-02 | Disposition: A | Discharge status: Home / Self Care | Payer: Medicare Other | Source: Ambulatory Visit | Attending: Specialist | Admitting: Specialist

## 2017-07-02 ENCOUNTER — Encounter (HOSPITAL_COMMUNITY): Payer: Self-pay

## 2017-07-02 ENCOUNTER — Ambulatory Visit: Payer: Self-pay | Admitting: Orthopedic Surgery

## 2017-07-02 DIAGNOSIS — Z01812 Encounter for preprocedural laboratory examination: Secondary | ICD-10-CM | POA: Diagnosis not present

## 2017-07-02 DIAGNOSIS — M1711 Unilateral primary osteoarthritis, right knee: Secondary | ICD-10-CM | POA: Diagnosis not present

## 2017-07-02 LAB — URINALYSIS, ROUTINE W REFLEX MICROSCOPIC
BILIRUBIN URINE: NEGATIVE
Glucose, UA: NEGATIVE mg/dL
HGB URINE DIPSTICK: NEGATIVE
KETONES UR: 5 mg/dL — AB
Leukocytes, UA: NEGATIVE
Nitrite: NEGATIVE
PROTEIN: 30 mg/dL — AB
SPECIFIC GRAVITY, URINE: 1.024 (ref 1.005–1.030)
pH: 5 (ref 5.0–8.0)

## 2017-07-02 LAB — BASIC METABOLIC PANEL
Anion gap: 8 (ref 5–15)
BUN: 14 mg/dL (ref 6–20)
CHLORIDE: 102 mmol/L (ref 101–111)
CO2: 28 mmol/L (ref 22–32)
Calcium: 8.6 mg/dL — ABNORMAL LOW (ref 8.9–10.3)
Creatinine, Ser: 0.89 mg/dL (ref 0.44–1.00)
GFR calc Af Amer: >60 mL/min (ref >60)
GLUCOSE: 101 mg/dL — AB (ref 65–99)
POTASSIUM: 3.1 mmol/L — AB (ref 3.5–5.1)
SODIUM: 138 mmol/L (ref 135–145)

## 2017-07-02 LAB — CBC
HEMATOCRIT: 36.3 % (ref 36.0–46.0)
Hemoglobin: 11.9 g/dL — ABNORMAL LOW (ref 12.0–15.0)
MCH: 31.5 pg (ref 26.0–34.0)
MCHC: 32.8 g/dL (ref 30.0–36.0)
MCV: 96 fL (ref 78.0–100.0)
Platelets: 213 10*3/uL (ref 150–400)
RBC: 3.78 MIL/uL — ABNORMAL LOW (ref 3.87–5.11)
RDW: 12.9 % (ref 11.5–15.5)
WBC: 7.1 10*3/uL (ref 4.0–10.5)

## 2017-07-02 LAB — APTT: aPTT: 25 seconds (ref 24–36)

## 2017-07-02 LAB — PROTIME-INR
INR: 0.87
Prothrombin Time: 11.7 seconds (ref 11.4–15.2)

## 2017-07-02 LAB — SURGICAL PCR SCREEN
MRSA, PCR: NEGATIVE
STAPHYLOCOCCUS AUREUS: NEGATIVE

## 2017-07-02 NOTE — H&P (Signed)
Alexandra Morrison DOB: [birth year] Married / Language: English / Race: White Female  H&P Date: 07/02/17  Chief Complaint: R knee pain  History of Present Illness The patient is a 71 year old female who comes in today for a preoperative History and Physical. The patient is scheduled for a right total knee arthroplasty to be performed by Dr. Johnn Hai, MD at [facility] on 07/11/2017. Alexandra Morrison reports chronic R knee pain progressively worsening interfering with ADLs and quality of life. Pain is refractory to conservative tx including medication, activity modification, relative rest, bracing, quad strengthening.  Dr. Tonita Cong and the patient mutually agreed to proceed with a total knee replacement. Risks and benefits of the procedure were discussed including stiffness, suboptimal range of motion, persistent pain, infection requiring removal of prosthesis and reinsertion, need for prophylactic antibiotics in the future, for example, dental procedures, possible need for manipulation, revision in the future and also anesthetic complications including DVT, PE, etc. We discussed the perioperative course, time in the hospital, postoperative recovery and the need for elevation to control swelling. We also discussed the predicted range of motion and the probability that squatting and kneeling would be unobtainable in the future. In addition, postoperative anticoagulation was discussed. We have obtained preoperative medical clearance as necessary. Provided illustrated handout and discussed it in detail. They will enroll in the total joint replacement educational forum at the hospital.  Problem List/Past Medical Hx Primary osteoarthritis of first carpometacarpal joint of right hand (M18.11)  Facet arthritis of cervical region (M46.92)  Cervical disc disorder with radiculopathy, mid-cervical region (M50.120)  Cervical spinal stenosis (M48.02)  Primary osteoarthritis of right knee (M17.11)   Chronic pain of right knee (M25.561)  Sleep Apnea  resolved s/p gastric bypass Osteoarthritis  Depression  Osteoporosis  Peripheral Neuropathy  Anxiety Disorder  Hypothyroidism  Diabetes Mellitus, Type II  resolved s/p gastric bypass Bipolar Disorder  Varicose veins  Degenerative Disc Disease  Mumps  childhood Measles  childhood Menopause   Allergies No Known Drug Allergies [04/08/2015]:  Family History Cancer  mother and father Depression  mother Osteoarthritis  mother and sister Osteoporosis  sister Heart disease in female family member before age 38  Hypertension  father and grandmother fathers side Heart Disease  father, sister, grandfather mothers side and grandmother fathers side First Degree Relatives   Social History Tobacco use  former smoker; smoke(d) 1 pack(s) per day Alcohol use  current drinker; drinks hard liquor; only occasionally per week Marital status  married Tobacco / smoke exposure  no Number of flights of stairs before winded  2-3 Living situation  live with spouse, one story home with one step to enter Drug/Alcohol Rehab (Previously)  no Exercise  Exercises weekly; does running / walking and other Drug/Alcohol Rehab (Currently)  no Current work status  retired Automotive engineer Illicit drug use  no Children  2 Pain Contract  no Psychologist, prison and probation services Will, Press photographer. Post-Surgical Plans  home with husband as caregiver. [facility]pt [facility] 9/25 [facility]  Medication History TraMADol HCl (50MG Tablet, Oral) Active. Alendronate Sodium (70MG Tablet, Oral) Active. Divalproex Sodium ER (500MG Tablet ER 24HR, Oral) Active. DULoxetine HCl (30MG Capsule DR Part, Oral) Active. Methocarbamol (500MG Tablet, Oral) Active. PARoxetine HCl (30MG Tablet, Oral) Active. Adderall (30MG Tablet, Oral) Active. (2 qd) Escitalopram Oxalate (20MG Tablet, Oral) Active. Furosemide  (20MG Tablet, Oral) Active. (qd) TraZODone HCl (50MG Tablet, Oral) Active. (qhs) Levothyroxine Sodium (50MCG Tablet, Oral) Active. (qd)  Meloxicam (7.5MG Tablet, Oral) Active. (prn) Lexapro (20MG Tablet, Oral) Active. (qd) LaMICtal XR (25 & 50 & 100MG Kit, Oral) Active. (19m qd) Biotin 5000 (5MG Capsule, Oral) Active. (qd) Cinnamon (500MG Capsule, Oral) Active. (qd) Calcium Carbonate (648MG Tablet, Oral) Active. (2-3 qd) Multi Vitamin Daily (Oral) Active. (qd) Fish Oil Burp-Less (1200MG Capsule, Oral) Active. (1750 qd) Co Q 10 (100MG Capsule, Oral) Active. (3069mqd) Probiotic-Prebiotic (1-250BILLION-MG Capsule, Oral) Active. (4 billion qd) Niacin Flush Free (500MG Capsule, Oral) Active. (qd) Aspirin EC (81MG Tablet DR, Oral) Active. (qd) ALPRAZolam (0.5MG Tablet, Oral) Active. (prn anxiety) Medications Reconciled  Past Surgical History Cataract Surgery  bilateral Cesarean Delivery  2 times Other Orthopaedic Surgery  fx R shoulder 2008 Tubal Ligation  Carpal Tunnel Repair  bilateral Gastric Bypass  2006 Fracture  right arm x2 childhood Total Shoulder Replacement - Both  2009 and 2010 Spinal Fusion - Neck  2017 Other Surgery  cervical ablation 2018  Review of Systems General Present- Fatigue and Night Sweats. Not Present- Chills, Fever, Memory Loss, Weight Gain and Weight Loss. Skin Present- Rash. Not Present- Eczema, Hives, Lesions, Pruritus and Ulcer. HEENT Not Present- Blurred Vision, Dentures, Double Vision, Headache, Hearing Loss and Tinnitus. Respiratory Present- Shortness of breath with exertion. Not Present- Allergies, Cough, Hemoptysis, Shortness of breath at rest and Wheezing. Cardiovascular Not Present- Chest Pain, Murmur, Orthopnea, Palpitations and Swelling. Gastrointestinal Not Present- Abdominal Pain, Constipation, Diarrhea, Difficulty Swallowing, Heartburn, Jaundice, Loss of appetitie, Melena, Nausea and Vomiting. Female Genitourinary Not  Present- Blood in Urine, Discharge, Flank Pain, Incontinence, Urgency, Urinary frequency, Urinary Retention, Urinating at Night and Weak urinary stream. Musculoskeletal Present- Joint Stiffness, Joint Swelling, Morning Stiffness, Muscle Pain and Spasms. Neurological Present- Difficulty with balance, Dizziness and Tremor. Not Present- Blackout spells, Paralysis and Weakness. Psychiatric Present- Insomnia.  Physical Exam General Mental Status -Alert, cooperative and good historian. General Appearance-pleasant, Not in acute distress. Orientation-Oriented X3. Build & Nutrition-Well nourished and Well developed.  Head and Neck Head-normocephalic, atraumatic . Neck Global Assessment - supple, no bruit auscultated on the right, no bruit auscultated on the left.  Eye Pupil - Bilateral-Regular and Round. Motion - Bilateral-EOMI.  Chest and Lung Exam Auscultation Breath sounds - clear at anterior chest wall and clear at posterior chest wall. Adventitious sounds - No Adventitious sounds.  Cardiovascular Auscultation Rhythm - Regular rate and rhythm. Heart Sounds - S1 WNL and S2 WNL. Murmurs & Other Heart Sounds - Auscultation of the heart reveals - No Murmurs.  Abdomen Palpation/Percussion Tenderness - Abdomen is non-tender to palpation. Rigidity (guarding) - Abdomen is soft. Auscultation Auscultation of the abdomen reveals - Bowel sounds normal.  Female Genitourinary Not done, not pertinent to present illness  Musculoskeletal On exam, standing, she walks with a varus thrust that is fairly significant. Seated position varus deformity. Exquisitely tender in the medial joint line. Patellofemoral pain with compression. Trace effusion. Bilateral hip and ankle exam is unremarkable.  Imaging X-rays of her knee demonstrate varus deformity, bone on bone arthrosis.  Assessment & Plan Primary osteoarthritis of right knee (M17.11)  Pt with end-stage right knee DJD,  bone-on-bone, refractory to conservative tx, scheduled for right total knee replacement by Dr. BeTonita Congn 07/11/17. We again discussed the procedure itself as well as risks, complications and alternatives, including but not limited to DVT, PE, infx, bleeding, failure of procedure, need for secondary procedure including manipulation, nerve injury, ongoing pain/symptoms, anesthesia risk, even stroke or death. Also discussed typical post-op protocols, activity restrictions, need for PT, flexion/extension exercises,  time out of work. Discussed need for DVT ppx post-op per protocol. Discussed dental ppx and infx prevention. Also discussed limitations post-operatively such as kneeling and squatting. All questions were answered. Patient desires to proceed with surgery as scheduled. Will hold supplements, ASA and NSAIDs accordingly. Will remain NPO after MN night before surgery. Will present to [facility] for pre-op testing. Anticipate hospital stay to include at least 2 midnights given medical history and to ensure proper pain control. Plan ASA 358m BID for DVT ppx post-op. Plan pain medication, Colace, Miralax. Plan home post-op with family members at home for assistance, [facility] Will follow up 10-14 days post-op for suture removal and xrays.  Plan right total knee replacement  Signed electronically by JCecilie Kicks PA-C for Dr. BTonita Cong

## 2017-07-02 NOTE — Progress Notes (Signed)
History of diabetes that resolved with gastric bypass surgery.No current history of DM or HTN. Questioning if pt requires an EKG. Per Dr. Jillyn Hidden, Anestesiologist, no EKG is required.

## 2017-07-03 NOTE — Progress Notes (Signed)
07-02-17 BMP and UA results routed to Dr. Tonita Cong for review.

## 2017-07-05 ENCOUNTER — Inpatient Hospital Stay (HOSPITAL_COMMUNITY): Admission: RE | Admit: 2017-07-05 | Payer: Medicare Other | Source: Ambulatory Visit

## 2017-07-10 NOTE — Anesthesia Preprocedure Evaluation (Addendum)
Anesthesia Evaluation  Patient identified by MRN, date of birth, ID band Patient awake    Reviewed: Allergy & Precautions, NPO status , Patient's Chart, lab work & pertinent test results  History of Anesthesia Complications Negative for: history of anesthetic complications  Airway Mallampati: III  TM Distance: >3 FB Neck ROM: Full  Mouth opening: Limited Mouth Opening  Dental  (+) Teeth Intact, Dental Advisory Given, Chipped   Pulmonary former smoker,    breath sounds clear to auscultation       Cardiovascular negative cardio ROS   Rhythm:Regular Rate:Normal     Neuro/Psych PSYCHIATRIC DISORDERS Bipolar Disorder negative neurological ROS     GI/Hepatic negative GI ROS, Neg liver ROS,   Endo/Other  Hypothyroidism   Renal/GU negative Renal ROS  negative genitourinary   Musculoskeletal  (+) Arthritis ,   Abdominal   Peds  Hematology negative hematology ROS (+)   Anesthesia Other Findings   Reproductive/Obstetrics                            Anesthesia Physical  Anesthesia Plan  ASA: II  Anesthesia Plan: Spinal   Post-op Pain Management:  Regional for Post-op pain   Induction:   PONV Risk Score and Plan: 2 and Propofol infusion and Treatment may vary due to age or medical condition  Airway Management Planned: Natural Airway and Nasal Cannula  Additional Equipment: None  Intra-op Plan:   Post-operative Plan:   Informed Consent: I have reviewed the patients History and Physical, chart, labs and discussed the procedure including the risks, benefits and alternatives for the proposed anesthesia with the patient or authorized representative who has indicated his/her understanding and acceptance.   Dental advisory given  Plan Discussed with: CRNA  Anesthesia Plan Comments:         Anesthesia Quick Evaluation

## 2017-07-11 ENCOUNTER — Inpatient Hospital Stay (HOSPITAL_COMMUNITY): Payer: Medicare Other

## 2017-07-11 ENCOUNTER — Inpatient Hospital Stay (HOSPITAL_COMMUNITY): Payer: Medicare Other | Admitting: Anesthesiology

## 2017-07-11 ENCOUNTER — Encounter (HOSPITAL_COMMUNITY)
Admission: RE | Disposition: A | Discharge status: Home / Self Care | Payer: Self-pay | Source: Ambulatory Visit | Attending: Specialist

## 2017-07-11 ENCOUNTER — Encounter (HOSPITAL_COMMUNITY): Payer: Self-pay | Admitting: Emergency Medicine

## 2017-07-11 ENCOUNTER — Inpatient Hospital Stay (HOSPITAL_COMMUNITY)
Admission: RE | Admit: 2017-07-11 | Discharge: 2017-07-13 | DRG: 470 | Disposition: A | Discharge status: Home / Self Care | Payer: Medicare Other | Source: Ambulatory Visit | Attending: Specialist | Admitting: Specialist

## 2017-07-11 DIAGNOSIS — Z79899 Other long term (current) drug therapy: Secondary | ICD-10-CM

## 2017-07-11 DIAGNOSIS — Z87891 Personal history of nicotine dependence: Secondary | ICD-10-CM

## 2017-07-11 DIAGNOSIS — G8918 Other acute postprocedural pain: Secondary | ICD-10-CM | POA: Diagnosis not present

## 2017-07-11 DIAGNOSIS — Z981 Arthrodesis status: Secondary | ICD-10-CM

## 2017-07-11 DIAGNOSIS — Z96659 Presence of unspecified artificial knee joint: Secondary | ICD-10-CM

## 2017-07-11 DIAGNOSIS — F319 Bipolar disorder, unspecified: Secondary | ICD-10-CM | POA: Diagnosis not present

## 2017-07-11 DIAGNOSIS — Z96612 Presence of left artificial shoulder joint: Secondary | ICD-10-CM | POA: Diagnosis not present

## 2017-07-11 DIAGNOSIS — F410 Panic disorder [episodic paroxysmal anxiety] without agoraphobia: Secondary | ICD-10-CM | POA: Diagnosis present

## 2017-07-11 DIAGNOSIS — M810 Age-related osteoporosis without current pathological fracture: Secondary | ICD-10-CM | POA: Diagnosis not present

## 2017-07-11 DIAGNOSIS — E039 Hypothyroidism, unspecified: Secondary | ICD-10-CM | POA: Diagnosis not present

## 2017-07-11 DIAGNOSIS — Z96611 Presence of right artificial shoulder joint: Secondary | ICD-10-CM | POA: Diagnosis present

## 2017-07-11 DIAGNOSIS — Z7982 Long term (current) use of aspirin: Secondary | ICD-10-CM

## 2017-07-11 DIAGNOSIS — Z9884 Bariatric surgery status: Secondary | ICD-10-CM | POA: Diagnosis not present

## 2017-07-11 DIAGNOSIS — G629 Polyneuropathy, unspecified: Secondary | ICD-10-CM | POA: Diagnosis not present

## 2017-07-11 DIAGNOSIS — M1711 Unilateral primary osteoarthritis, right knee: Secondary | ICD-10-CM | POA: Diagnosis not present

## 2017-07-11 DIAGNOSIS — M25561 Pain in right knee: Secondary | ICD-10-CM | POA: Diagnosis not present

## 2017-07-11 DIAGNOSIS — Z471 Aftercare following joint replacement surgery: Secondary | ICD-10-CM | POA: Diagnosis not present

## 2017-07-11 DIAGNOSIS — Z96651 Presence of right artificial knee joint: Secondary | ICD-10-CM | POA: Diagnosis not present

## 2017-07-11 HISTORY — PX: TOTAL KNEE ARTHROPLASTY: SHX125

## 2017-07-11 SURGERY — ARTHROPLASTY, KNEE, TOTAL
Anesthesia: Spinal | Site: Knee | Laterality: Right

## 2017-07-11 MED ORDER — TRANEXAMIC ACID 1000 MG/10ML IV SOLN
1000.0000 mg | INTRAVENOUS | Status: AC
  Administered 2017-07-11: 1000 mg via INTRAVENOUS
  Filled 2017-07-11: qty 1100

## 2017-07-11 MED ORDER — FENTANYL CITRATE (PF) 100 MCG/2ML IJ SOLN
INTRAMUSCULAR | Status: AC
  Filled 2017-07-11: qty 2

## 2017-07-11 MED ORDER — MAGNESIUM CITRATE PO SOLN: 1.0000 | Freq: Once | ORAL | Status: DC | PRN

## 2017-07-11 MED ORDER — ACETAMINOPHEN 325 MG PO TABS: 650.0000 mg | ORAL_TABLET | Freq: Four times a day (QID) | ORAL | Status: DC | PRN

## 2017-07-11 MED ORDER — BUPIVACAINE-EPINEPHRINE 0.25% -1:200000 IJ SOLN
INTRAMUSCULAR | Status: DC | PRN
  Administered 2017-07-11: 30 mL

## 2017-07-11 MED ORDER — DULOXETINE HCL 30 MG PO CPEP: 30.0000 mg | ORAL_CAPSULE | Freq: Every evening | ORAL | Status: DC

## 2017-07-11 MED ORDER — ACETAMINOPHEN 10 MG/ML IV SOLN
1000.0000 mg | INTRAVENOUS | Status: AC
  Administered 2017-07-11: 1000 mg via INTRAVENOUS

## 2017-07-11 MED ORDER — ACETAMINOPHEN 650 MG RE SUPP: 650.0000 mg | Freq: Four times a day (QID) | RECTAL | Status: DC | PRN

## 2017-07-11 MED ORDER — CEFAZOLIN SODIUM-DEXTROSE 2-4 GM/100ML-% IV SOLN
2.0000 g | INTRAVENOUS | Status: AC
  Administered 2017-07-11: 2 g via INTRAVENOUS

## 2017-07-11 MED ORDER — ACETAMINOPHEN 500 MG PO TABS: 500.0000 mg | ORAL_TABLET | Freq: Four times a day (QID) | ORAL | Status: DC | PRN

## 2017-07-11 MED ORDER — MENTHOL 3 MG MT LOZG: 1.0000 | LOZENGE | OROMUCOSAL | Status: DC | PRN

## 2017-07-11 MED ORDER — PAROXETINE HCL 20 MG PO TABS
60.0000 mg | ORAL_TABLET | Freq: Every day | ORAL | Status: DC
  Administered 2017-07-12 – 2017-07-13 (×2): 60 mg via ORAL
  Filled 2017-07-11 (×2): qty 3

## 2017-07-11 MED ORDER — FENTANYL CITRATE (PF) 100 MCG/2ML IJ SOLN
25.0000 ug | INTRAMUSCULAR | Status: DC | PRN
  Administered 2017-07-11: 50 ug via INTRAVENOUS

## 2017-07-11 MED ORDER — ONDANSETRON HCL 4 MG/2ML IJ SOLN: 4.0000 mg | Freq: Once | INTRAMUSCULAR | Status: DC | PRN

## 2017-07-11 MED ORDER — BISACODYL 5 MG PO TBEC: 5.0000 mg | DELAYED_RELEASE_TABLET | Freq: Every day | ORAL | Status: DC | PRN

## 2017-07-11 MED ORDER — METHOCARBAMOL 500 MG PO TABS
500.0000 mg | ORAL_TABLET | Freq: Four times a day (QID) | ORAL | Status: DC | PRN
  Administered 2017-07-11 – 2017-07-13 (×6): 500 mg via ORAL
  Filled 2017-07-11 (×6): qty 1

## 2017-07-11 MED ORDER — ALPRAZOLAM 0.5 MG PO TABS
0.5000 mg | ORAL_TABLET | Freq: Two times a day (BID) | ORAL | Status: DC | PRN
  Administered 2017-07-13: 0.5 mg via ORAL
  Filled 2017-07-11: qty 1

## 2017-07-11 MED ORDER — DOCUSATE SODIUM 100 MG PO CAPS: 100.0000 mg | ORAL_CAPSULE | Freq: Two times a day (BID) | ORAL | 1 refills | Status: DC | PRN

## 2017-07-11 MED ORDER — SUGAMMADEX SODIUM 200 MG/2ML IV SOLN
INTRAVENOUS | Status: DC | PRN
  Administered 2017-07-11: 125 mg via INTRAVENOUS

## 2017-07-11 MED ORDER — DOCUSATE SODIUM 100 MG PO CAPS
100.0000 mg | ORAL_CAPSULE | Freq: Two times a day (BID) | ORAL | Status: DC
  Administered 2017-07-11 – 2017-07-13 (×5): 100 mg via ORAL
  Filled 2017-07-11 (×5): qty 1

## 2017-07-11 MED ORDER — BUPIVACAINE-EPINEPHRINE (PF) 0.25% -1:200000 IJ SOLN
INTRAMUSCULAR | Status: AC
  Filled 2017-07-11: qty 30

## 2017-07-11 MED ORDER — PROPOFOL 10 MG/ML IV BOLUS
INTRAVENOUS | Status: AC
  Filled 2017-07-11: qty 40

## 2017-07-11 MED ORDER — CHLORHEXIDINE GLUCONATE 4 % EX LIQD: 60.0000 mL | Freq: Once | CUTANEOUS | Status: DC

## 2017-07-11 MED ORDER — OXYCODONE HCL 5 MG PO TABS
5.0000 mg | ORAL_TABLET | ORAL | Status: DC | PRN
  Administered 2017-07-11 (×4): 5 mg via ORAL
  Administered 2017-07-12 (×2): 10 mg via ORAL
  Administered 2017-07-12: 09:00:00 5 mg via ORAL
  Administered 2017-07-12: 10 mg via ORAL
  Administered 2017-07-12: 05:00:00 5 mg via ORAL
  Administered 2017-07-13 (×3): 10 mg via ORAL
  Filled 2017-07-11: qty 1
  Filled 2017-07-11 (×6): qty 2
  Filled 2017-07-11 (×5): qty 1

## 2017-07-11 MED ORDER — SUGAMMADEX SODIUM 200 MG/2ML IV SOLN
INTRAVENOUS | Status: AC
  Filled 2017-07-11: qty 2

## 2017-07-11 MED ORDER — HYDROCODONE-ACETAMINOPHEN 5-325 MG PO TABS: 1.0000 | ORAL_TABLET | ORAL | 0 refills | Status: DC | PRN

## 2017-07-11 MED ORDER — DIVALPROEX SODIUM ER 500 MG PO TB24
500.0000 mg | ORAL_TABLET | Freq: Every day | ORAL | Status: DC
  Administered 2017-07-12 – 2017-07-13 (×2): 500 mg via ORAL
  Filled 2017-07-11 (×2): qty 1

## 2017-07-11 MED ORDER — DEXAMETHASONE SODIUM PHOSPHATE 10 MG/ML IJ SOLN
INTRAMUSCULAR | Status: AC
  Filled 2017-07-11: qty 1

## 2017-07-11 MED ORDER — MIDAZOLAM HCL 2 MG/2ML IJ SOLN
INTRAMUSCULAR | Status: AC
  Administered 2017-07-11: 0.5 mg via INTRAVENOUS
  Filled 2017-07-11: qty 2

## 2017-07-11 MED ORDER — POLYETHYLENE GLYCOL 3350 17 G PO PACK: 17.0000 g | PACK | Freq: Every day | ORAL | Status: DC | PRN

## 2017-07-11 MED ORDER — BUPIVACAINE HCL (PF) 0.5 % IJ SOLN
INTRAMUSCULAR | Status: AC
  Filled 2017-07-11: qty 30

## 2017-07-11 MED ORDER — SODIUM CHLORIDE 0.9 % IR SOLN
Status: DC | PRN
  Administered 2017-07-11: 1000 mL

## 2017-07-11 MED ORDER — ONDANSETRON HCL 4 MG/2ML IJ SOLN
INTRAMUSCULAR | Status: AC
  Filled 2017-07-11: qty 2

## 2017-07-11 MED ORDER — MIDAZOLAM HCL 2 MG/2ML IJ SOLN
INTRAMUSCULAR | Status: AC
  Filled 2017-07-11: qty 2

## 2017-07-11 MED ORDER — ASPIRIN EC 325 MG PO TBEC
325.0000 mg | DELAYED_RELEASE_TABLET | Freq: Two times a day (BID) | ORAL | Status: DC
  Administered 2017-07-11 – 2017-07-13 (×4): 325 mg via ORAL
  Filled 2017-07-11 (×4): qty 1

## 2017-07-11 MED ORDER — POLYETHYLENE GLYCOL 3350 17 G PO PACK: 17.0000 g | PACK | Freq: Every day | ORAL | 0 refills | Status: DC

## 2017-07-11 MED ORDER — FENTANYL CITRATE (PF) 100 MCG/2ML IJ SOLN
INTRAMUSCULAR | Status: DC | PRN
  Administered 2017-07-11 (×4): 50 ug via INTRAVENOUS

## 2017-07-11 MED ORDER — SUCCINYLCHOLINE CHLORIDE 200 MG/10ML IV SOSY
PREFILLED_SYRINGE | INTRAVENOUS | Status: AC
  Filled 2017-07-11: qty 10

## 2017-07-11 MED ORDER — FERROUS SULFATE 325 (65 FE) MG PO TABS
325.0000 mg | ORAL_TABLET | Freq: Every day | ORAL | Status: DC
  Administered 2017-07-11 – 2017-07-12 (×2): 325 mg via ORAL
  Filled 2017-07-11 (×2): qty 1

## 2017-07-11 MED ORDER — TRAZODONE HCL 50 MG PO TABS: 50.0000 mg | ORAL_TABLET | Freq: Every evening | ORAL | Status: DC | PRN

## 2017-07-11 MED ORDER — PROPOFOL 10 MG/ML IV BOLUS
INTRAVENOUS | Status: AC
  Filled 2017-07-11: qty 20

## 2017-07-11 MED ORDER — SODIUM CHLORIDE 0.9 % IV SOLN
INTRAVENOUS | Status: AC
  Filled 2017-07-11: qty 500000

## 2017-07-11 MED ORDER — HYDROMORPHONE HCL-NACL 0.5-0.9 MG/ML-% IV SOSY: 0.5000 mg | PREFILLED_SYRINGE | INTRAVENOUS | Status: DC | PRN

## 2017-07-11 MED ORDER — ASPIRIN EC 325 MG PO TBEC: 325.0000 mg | DELAYED_RELEASE_TABLET | Freq: Two times a day (BID) | ORAL | 1 refills | Status: DC

## 2017-07-11 MED ORDER — MIDAZOLAM HCL 2 MG/2ML IJ SOLN
0.5000 mg | Freq: Once | INTRAMUSCULAR | Status: AC
  Administered 2017-07-11: 0.5 mg via INTRAVENOUS

## 2017-07-11 MED ORDER — KCL IN DEXTROSE-NACL 20-5-0.45 MEQ/L-%-% IV SOLN
INTRAVENOUS | Status: AC
  Filled 2017-07-11: qty 1000

## 2017-07-11 MED ORDER — ONDANSETRON HCL 4 MG/2ML IJ SOLN: 4.0000 mg | Freq: Four times a day (QID) | INTRAMUSCULAR | Status: DC | PRN

## 2017-07-11 MED ORDER — RISAQUAD PO CAPS
1.0000 | ORAL_CAPSULE | Freq: Every day | ORAL | Status: DC
  Administered 2017-07-11 – 2017-07-13 (×3): 1 via ORAL
  Filled 2017-07-11 (×3): qty 1

## 2017-07-11 MED ORDER — ALUM & MAG HYDROXIDE-SIMETH 200-200-20 MG/5ML PO SUSP: 30.0000 mL | ORAL | Status: DC | PRN

## 2017-07-11 MED ORDER — PHENOL 1.4 % MT LIQD: 1.0000 | OROMUCOSAL | Status: DC | PRN

## 2017-07-11 MED ORDER — METOCLOPRAMIDE HCL 5 MG PO TABS: 5.0000 mg | ORAL_TABLET | Freq: Three times a day (TID) | ORAL | Status: DC | PRN

## 2017-07-11 MED ORDER — ACETAMINOPHEN 10 MG/ML IV SOLN
INTRAVENOUS | Status: AC
  Filled 2017-07-11: qty 100

## 2017-07-11 MED ORDER — DIPHENHYDRAMINE HCL 12.5 MG/5ML PO ELIX: 12.5000 mg | ORAL_SOLUTION | ORAL | Status: DC | PRN

## 2017-07-11 MED ORDER — LACTATED RINGERS IV SOLN
INTRAVENOUS | Status: DC
  Administered 2017-07-11 (×2): via INTRAVENOUS

## 2017-07-11 MED ORDER — LEVOTHYROXINE SODIUM 50 MCG PO TABS
50.0000 ug | ORAL_TABLET | Freq: Every day | ORAL | Status: DC
  Administered 2017-07-12 – 2017-07-13 (×2): 50 ug via ORAL
  Filled 2017-07-11 (×2): qty 1

## 2017-07-11 MED ORDER — METHOCARBAMOL 1000 MG/10ML IJ SOLN
500.0000 mg | Freq: Four times a day (QID) | INTRAVENOUS | Status: DC | PRN
  Administered 2017-07-11: 500 mg via INTRAVENOUS
  Filled 2017-07-11: qty 550

## 2017-07-11 MED ORDER — ACETAMINOPHEN 10 MG/ML IV SOLN
1000.0000 mg | Freq: Four times a day (QID) | INTRAVENOUS | Status: DC
  Administered 2017-07-11: 1000 mg via INTRAVENOUS
  Filled 2017-07-11 (×2): qty 100

## 2017-07-11 MED ORDER — BUPIVACAINE-EPINEPHRINE (PF) 0.5% -1:200000 IJ SOLN
INTRAMUSCULAR | Status: DC | PRN
  Administered 2017-07-11: 25 mL via PERINEURAL

## 2017-07-11 MED ORDER — ROCURONIUM BROMIDE 50 MG/5ML IV SOSY
PREFILLED_SYRINGE | INTRAVENOUS | Status: AC
  Filled 2017-07-11: qty 5

## 2017-07-11 MED ORDER — CEFAZOLIN SODIUM-DEXTROSE 2-4 GM/100ML-% IV SOLN
2.0000 g | Freq: Four times a day (QID) | INTRAVENOUS | Status: AC
  Administered 2017-07-11 (×2): 2 g via INTRAVENOUS
  Filled 2017-07-11 (×3): qty 100

## 2017-07-11 MED ORDER — CEFAZOLIN SODIUM-DEXTROSE 2-4 GM/100ML-% IV SOLN
INTRAVENOUS | Status: AC
  Filled 2017-07-11: qty 100

## 2017-07-11 MED ORDER — FUROSEMIDE 20 MG PO TABS: 20.0000 mg | ORAL_TABLET | Freq: Two times a day (BID) | ORAL | Status: DC | PRN

## 2017-07-11 MED ORDER — ONDANSETRON HCL 4 MG/2ML IJ SOLN
INTRAMUSCULAR | Status: DC | PRN
  Administered 2017-07-11: 4 mg via INTRAVENOUS

## 2017-07-11 MED ORDER — PROPOFOL 10 MG/ML IV BOLUS
INTRAVENOUS | Status: DC | PRN
  Administered 2017-07-11: 100 mg via INTRAVENOUS

## 2017-07-11 MED ORDER — SODIUM CHLORIDE 0.9 % IV SOLN
INTRAVENOUS | Status: DC | PRN
  Administered 2017-07-11: 500 mL

## 2017-07-11 MED ORDER — AMPHETAMINE-DEXTROAMPHETAMINE 10 MG PO TABS: 30.0000 mg | ORAL_TABLET | Freq: Two times a day (BID) | ORAL | Status: DC

## 2017-07-11 MED ORDER — METOCLOPRAMIDE HCL 5 MG/ML IJ SOLN: 5.0000 mg | Freq: Three times a day (TID) | INTRAMUSCULAR | Status: DC | PRN

## 2017-07-11 MED ORDER — LIDOCAINE 2% (20 MG/ML) 5 ML SYRINGE
INTRAMUSCULAR | Status: AC
  Filled 2017-07-11: qty 5

## 2017-07-11 MED ORDER — ACETAMINOPHEN 500 MG PO TABS
1000.0000 mg | ORAL_TABLET | Freq: Four times a day (QID) | ORAL | Status: AC
  Administered 2017-07-11 – 2017-07-12 (×3): 1000 mg via ORAL
  Filled 2017-07-11 (×3): qty 2

## 2017-07-11 MED ORDER — LIDOCAINE 2% (20 MG/ML) 5 ML SYRINGE
INTRAMUSCULAR | Status: DC | PRN
  Administered 2017-07-11: 40 mg via INTRAVENOUS

## 2017-07-11 MED ORDER — MIDAZOLAM HCL 5 MG/5ML IJ SOLN
INTRAMUSCULAR | Status: DC | PRN
  Administered 2017-07-11 (×2): 1 mg via INTRAVENOUS

## 2017-07-11 MED ORDER — ONDANSETRON HCL 4 MG PO TABS: 4.0000 mg | ORAL_TABLET | Freq: Four times a day (QID) | ORAL | Status: DC | PRN

## 2017-07-11 MED ORDER — ROCURONIUM BROMIDE 10 MG/ML (PF) SYRINGE
PREFILLED_SYRINGE | INTRAVENOUS | Status: DC | PRN
  Administered 2017-07-11: 40 mg via INTRAVENOUS

## 2017-07-11 MED ORDER — DEXAMETHASONE SODIUM PHOSPHATE 10 MG/ML IJ SOLN
INTRAMUSCULAR | Status: DC | PRN
  Administered 2017-07-11: 10 mg via INTRAVENOUS

## 2017-07-11 SURGICAL SUPPLY — 59 items
BAG ZIPLOCK 12X15 (MISCELLANEOUS) IMPLANT
BANDAGE ACE 4X5 VEL STRL LF (GAUZE/BANDAGES/DRESSINGS) ×3 IMPLANT
BANDAGE ACE 6X5 VEL STRL LF (GAUZE/BANDAGES/DRESSINGS) ×3 IMPLANT
BLADE SAG 18X100X1.27 (BLADE) ×3 IMPLANT
BLADE SAW SGTL 11.0X1.19X90.0M (BLADE) ×3 IMPLANT
BLADE SAW SGTL 13.0X1.19X90.0M (BLADE) ×3 IMPLANT
CAPT KNEE TOTAL 3 ATTUNE ×3 IMPLANT
CEMENT HV SMART SET (Cement) ×6 IMPLANT
CLOSURE WOUND 1/2 X4 (GAUZE/BANDAGES/DRESSINGS) ×1
CLOTH 2% CHLOROHEXIDINE 3PK (PERSONAL CARE ITEMS) ×3 IMPLANT
COVER SURGICAL LIGHT HANDLE (MISCELLANEOUS) ×3 IMPLANT
CUFF TOURN SGL QUICK 34 (TOURNIQUET CUFF) ×2
CUFF TRNQT CYL 34X4X40X1 (TOURNIQUET CUFF) ×1 IMPLANT
DECANTER SPIKE VIAL GLASS SM (MISCELLANEOUS) ×3 IMPLANT
DRAPE INCISE IOBAN 66X45 STRL (DRAPES) IMPLANT
DRAPE ORTHO SPLIT 77X108 STRL (DRAPES) ×4
DRAPE SHEET LG 3/4 BI-LAMINATE (DRAPES) ×3 IMPLANT
DRAPE SURG ORHT 6 SPLT 77X108 (DRAPES) ×2 IMPLANT
DRAPE U-SHAPE 47X51 STRL (DRAPES) ×3 IMPLANT
DRSG AQUACEL AG ADV 3.5X10 (GAUZE/BANDAGES/DRESSINGS) ×3 IMPLANT
DRSG TEGADERM 4X4.75 (GAUZE/BANDAGES/DRESSINGS) IMPLANT
DURAPREP 26ML APPLICATOR (WOUND CARE) ×3 IMPLANT
ELECT REM PT RETURN 15FT ADLT (MISCELLANEOUS) ×3 IMPLANT
EVACUATOR 1/8 PVC DRAIN (DRAIN) IMPLANT
GAUZE SPONGE 2X2 8PLY STRL LF (GAUZE/BANDAGES/DRESSINGS) IMPLANT
GLOVE BIOGEL PI IND STRL 7.0 (GLOVE) ×6 IMPLANT
GLOVE BIOGEL PI IND STRL 8 (GLOVE) ×1 IMPLANT
GLOVE BIOGEL PI INDICATOR 7.0 (GLOVE) ×12
GLOVE BIOGEL PI INDICATOR 8 (GLOVE) ×2
GLOVE SURG SS PI 7.0 STRL IVOR (GLOVE) ×3 IMPLANT
GLOVE SURG SS PI 7.5 STRL IVOR (GLOVE) ×3 IMPLANT
GLOVE SURG SS PI 8.0 STRL IVOR (GLOVE) ×6 IMPLANT
GOWN STRL REUS W/TWL XL LVL3 (GOWN DISPOSABLE) ×6 IMPLANT
HANDPIECE INTERPULSE COAX TIP (DISPOSABLE) ×2
HEMOSTAT SPONGE AVITENE ULTRA (HEMOSTASIS) ×3 IMPLANT
IMMOBILIZER KNEE 20 (SOFTGOODS) ×3
IMMOBILIZER KNEE 20 THIGH 36 (SOFTGOODS) ×1 IMPLANT
MANIFOLD NEPTUNE II (INSTRUMENTS) ×3 IMPLANT
NS IRRIG 1000ML POUR BTL (IV SOLUTION) IMPLANT
PACK TOTAL KNEE CUSTOM (KITS) ×3 IMPLANT
POSITIONER SURGICAL ARM (MISCELLANEOUS) ×3 IMPLANT
SET HNDPC FAN SPRY TIP SCT (DISPOSABLE) ×1 IMPLANT
SPONGE GAUZE 2X2 STER 10/PKG (GAUZE/BANDAGES/DRESSINGS)
SPONGE SURGIFOAM ABS GEL 100 (HEMOSTASIS) ×3 IMPLANT
STAPLER VISISTAT (STAPLE) IMPLANT
STRIP CLOSURE SKIN 1/2X4 (GAUZE/BANDAGES/DRESSINGS) ×2 IMPLANT
SUT BONE WAX W31G (SUTURE) IMPLANT
SUT MNCRL AB 4-0 PS2 18 (SUTURE) ×3 IMPLANT
SUT STRATAFIX 0 PDS 27 VIOLET (SUTURE) ×3
SUT VIC AB 1 CT1 27 (SUTURE) ×4
SUT VIC AB 1 CT1 27XBRD ANTBC (SUTURE) ×2 IMPLANT
SUT VIC AB 2-0 CT1 27 (SUTURE) ×6
SUT VIC AB 2-0 CT1 TAPERPNT 27 (SUTURE) ×3 IMPLANT
SUTURE STRATFX 0 PDS 27 VIOLET (SUTURE) ×1 IMPLANT
SYR 50ML LL SCALE MARK (SYRINGE) IMPLANT
TOWER CARTRIDGE SMART MIX (DISPOSABLE) ×3 IMPLANT
WATER STERILE IRR 1500ML POUR (IV SOLUTION) ×6 IMPLANT
WRAP KNEE MAXI GEL POST OP (GAUZE/BANDAGES/DRESSINGS) ×3 IMPLANT
YANKAUER SUCT BULB TIP 10FT TU (MISCELLANEOUS) ×3 IMPLANT

## 2017-07-11 NOTE — H&P (View-Only) (Signed)
Alexandra Morrison DOB: [birth year] Married / Language: English / Race: White Female  H&P Date: 07/02/17  Chief Complaint: R knee pain  History of Present Illness The patient is a 71 year old female who comes in today for a preoperative History and Physical. The patient is scheduled for a right total knee arthroplasty to be performed by Dr. Johnn Hai, MD at [facility] on 07/11/2017. [name] reports chronic R knee pain progressively worsening interfering with ADLs and quality of life. Pain is refractory to conservative tx including medication, activity modification, relative rest, bracing, quad strengthening.  Dr. Tonita Cong and the patient mutually agreed to proceed with a total knee replacement. Risks and benefits of the procedure were discussed including stiffness, suboptimal range of motion, persistent pain, infection requiring removal of prosthesis and reinsertion, need for prophylactic antibiotics in the future, for example, dental procedures, possible need for manipulation, revision in the future and also anesthetic complications including DVT, PE, etc. We discussed the perioperative course, time in the hospital, postoperative recovery and the need for elevation to control swelling. We also discussed the predicted range of motion and the probability that squatting and kneeling would be unobtainable in the future. In addition, postoperative anticoagulation was discussed. We have obtained preoperative medical clearance as necessary. Provided illustrated handout and discussed it in detail. They will enroll in the total joint replacement educational forum at the hospital.  Problem List/Past Medical Hx Primary osteoarthritis of first carpometacarpal joint of right hand (M18.11)  Facet arthritis of cervical region (M46.92)  Cervical disc disorder with radiculopathy, mid-cervical region (M50.120)  Cervical spinal stenosis (M48.02)  Primary osteoarthritis of right knee (M17.11)   Chronic pain of right knee (M25.561)  Sleep Apnea  resolved s/p gastric bypass Osteoarthritis  Depression  Osteoporosis  Peripheral Neuropathy  Anxiety Disorder  Hypothyroidism  Diabetes Mellitus, Type II  resolved s/p gastric bypass Bipolar Disorder  Varicose veins  Degenerative Disc Disease  Mumps  childhood Measles  childhood Menopause   Allergies No Known Drug Allergies [04/08/2015]:  Family History Cancer  mother and father Depression  mother Osteoarthritis  mother and sister Osteoporosis  sister Heart disease in female family member before age 76  Hypertension  father and grandmother fathers side Heart Disease  father, sister, grandfather mothers side and grandmother fathers side First Degree Relatives   Social History Tobacco use  former smoker; smoke(d) 1 pack(s) per day Alcohol use  current drinker; drinks hard liquor; only occasionally per week Marital status  married Tobacco / smoke exposure  no Number of flights of stairs before winded  2-3 Living situation  live with spouse, one story home with one step to enter Drug/Alcohol Rehab (Previously)  no Exercise  Exercises weekly; does running / walking and other Drug/Alcohol Rehab (Currently)  no Current work status  retired Automotive engineer Illicit drug use  no Children  2 Pain Contract  no Psychologist, prison and probation services Will, Press photographer. Post-Surgical Plans  home with husband as caregiver. [facility]/25 [facility]  Medication History TraMADol HCl (50MG Tablet, Oral) Active. Alendronate Sodium (70MG Tablet, Oral) Active. Divalproex Sodium ER (500MG Tablet ER 24HR, Oral) Active. DULoxetine HCl (30MG Capsule DR Part, Oral) Active. Methocarbamol (500MG Tablet, Oral) Active. PARoxetine HCl (30MG Tablet, Oral) Active. Adderall (30MG Tablet, Oral) Active. (2 qd) Escitalopram Oxalate (20MG Tablet, Oral) Active. Furosemide  (20MG Tablet, Oral) Active. (qd) TraZODone HCl (50MG Tablet, Oral) Active. (qhs) Levothyroxine Sodium (50MCG Tablet, Oral) Active. (qd)  Meloxicam (7.5MG Tablet, Oral) Active. (prn) Lexapro (20MG Tablet, Oral) Active. (qd) LaMICtal XR (25 & 50 & 100MG Kit, Oral) Active. (26m qd) Biotin 5000 (5MG Capsule, Oral) Active. (qd) Cinnamon (500MG Capsule, Oral) Active. (qd) Calcium Carbonate (648MG Tablet, Oral) Active. (2-3 qd) Multi Vitamin Daily (Oral) Active. (qd) Fish Oil Burp-Less (1200MG Capsule, Oral) Active. (1750 qd) Co Q 10 (100MG Capsule, Oral) Active. (3054mqd) Probiotic-Prebiotic (1-250BILLION-MG Capsule, Oral) Active. (4 billion qd) Niacin Flush Free (500MG Capsule, Oral) Active. (qd) Aspirin EC (81MG Tablet DR, Oral) Active. (qd) ALPRAZolam (0.5MG Tablet, Oral) Active. (prn anxiety) Medications Reconciled  Past Surgical History Cataract Surgery  bilateral Cesarean Delivery  2 times Other Orthopaedic Surgery  fx R shoulder 2008 Tubal Ligation  Carpal Tunnel Repair  bilateral Gastric Bypass  2006 Fracture  right arm x2 childhood Total Shoulder Replacement - Both  2009 and 2010 Spinal Fusion - Neck  2017 Other Surgery  cervical ablation 2018  Review of Systems General Present- Fatigue and Night Sweats. Not Present- Chills, Fever, Memory Loss, Weight Gain and Weight Loss. Skin Present- Rash. Not Present- Eczema, Hives, Lesions, Pruritus and Ulcer. HEENT Not Present- Blurred Vision, Dentures, Double Vision, Headache, Hearing Loss and Tinnitus. Respiratory Present- Shortness of breath with exertion. Not Present- Allergies, Cough, Hemoptysis, Shortness of breath at rest and Wheezing. Cardiovascular Not Present- Chest Pain, Murmur, Orthopnea, Palpitations and Swelling. Gastrointestinal Not Present- Abdominal Pain, Constipation, Diarrhea, Difficulty Swallowing, Heartburn, Jaundice, Loss of appetitie, Melena, Nausea and Vomiting. Female Genitourinary Not  Present- Blood in Urine, Discharge, Flank Pain, Incontinence, Urgency, Urinary frequency, Urinary Retention, Urinating at Night and Weak urinary stream. Musculoskeletal Present- Joint Stiffness, Joint Swelling, Morning Stiffness, Muscle Pain and Spasms. Neurological Present- Difficulty with balance, Dizziness and Tremor. Not Present- Blackout spells, Paralysis and Weakness. Psychiatric Present- Insomnia.  Physical Exam General Mental Status -Alert, cooperative and good historian. General Appearance-pleasant, Not in acute distress. Orientation-Oriented X3. Build & Nutrition-Well nourished and Well developed.  Head and Neck Head-normocephalic, atraumatic . Neck Global Assessment - supple, no bruit auscultated on the right, no bruit auscultated on the left.  Eye Pupil - Bilateral-Regular and Round. Motion - Bilateral-EOMI.  Chest and Lung Exam Auscultation Breath sounds - clear at anterior chest wall and clear at posterior chest wall. Adventitious sounds - No Adventitious sounds.  Cardiovascular Auscultation Rhythm - Regular rate and rhythm. Heart Sounds - S1 WNL and S2 WNL. Murmurs & Other Heart Sounds - Auscultation of the heart reveals - No Murmurs.  Abdomen Palpation/Percussion Tenderness - Abdomen is non-tender to palpation. Rigidity (guarding) - Abdomen is soft. Auscultation Auscultation of the abdomen reveals - Bowel sounds normal.  Female Genitourinary Not done, not pertinent to present illness  Musculoskeletal On exam, standing, she walks with a varus thrust that is fairly significant. Seated position varus deformity. Exquisitely tender in the medial joint line. Patellofemoral pain with compression. Trace effusion. Bilateral hip and ankle exam is unremarkable.  Imaging X-rays of her knee demonstrate varus deformity, bone on bone arthrosis.  Assessment & Plan Primary osteoarthritis of right knee (M17.11)  Pt with end-stage right knee DJD,  bone-on-bone, refractory to conservative tx, scheduled for right total knee replacement by Dr. BeTonita Congn 07/11/17. We again discussed the procedure itself as well as risks, complications and alternatives, including but not limited to DVT, PE, infx, bleeding, failure of procedure, need for secondary procedure including manipulation, nerve injury, ongoing pain/symptoms, anesthesia risk, even stroke or death. Also discussed typical post-op protocols, activity restrictions, need for PT, flexion/extension exercises,  time out of work. Discussed need for DVT ppx post-op per protocol. Discussed dental ppx and infx prevention. Also discussed limitations post-operatively such as kneeling and squatting. All questions were answered. Patient desires to proceed with surgery as scheduled. Will hold supplements, ASA and NSAIDs accordingly. Will remain NPO after MN night before surgery. Will present to [facility] for pre-op testing. Anticipate hospital stay to include at least 2 midnights given medical history and to ensure proper pain control. Plan ASA 360m BID for DVT ppx post-op. Plan pain medication, Colace, Miralax. Plan home post-op with family members at home for assistance, [facility] Will follow up 10-14 days post-op for suture removal and xrays.  Plan right total knee replacement  Signed electronically by JCecilie Kicks PA-C for Dr. BTonita Cong

## 2017-07-11 NOTE — Progress Notes (Signed)
Pharmacy IV to PO conversion  The patient is receiving Acetaminophen by the intravenous route.  Based on criteria approved by the Pharmacy and [place], the medication is being converted to the equivalent oral dose form.   IV acetaminophen and IV diphenhydramine will be converted to the same dosage PO as soon as the patient has taken an oral or enteral medication and will be continued through the duration of the original order  If you have any questions about this conversion, please contact the Pharmacy Department (ext [phone]).  Thank you.  Reuel Boom, PharmD Pager: [phone] 07/11/2017, 3:50 PM

## 2017-07-11 NOTE — Discharge Instructions (Signed)

## 2017-07-11 NOTE — Anesthesia Procedure Notes (Addendum)
Anesthesia Regional Block: Adductor canal block   Pre-Anesthetic Checklist: ,, timeout performed, Correct Patient, Correct Site, Correct Laterality, Correct Procedure, Correct Position, site marked, Risks and benefits discussed,  Surgical consent,  Pre-op evaluation,  At surgeon's request and post-op pain management  Laterality: Right  Prep: chloraprep       Needles:  Injection technique: Single-shot  Needle Type: Echogenic Needle     Needle Length: 9cm  Needle Gauge: 21     Additional Needles:   Narrative:  Start time: 07/11/2017 6:58 AM End time: 07/11/2017 7:01 AM Injection made incrementally with aspirations every 5 mL.  Performed by: Personally  Anesthesiologist: Renold Don E  Additional Notes: No pain on injection. No increased resistance to injection. Injection made in 5cc increments. Good needle visualization. Patient tolerated the procedure well.

## 2017-07-11 NOTE — Anesthesia Postprocedure Evaluation (Signed)
Anesthesia Post Note  Patient: Alexandra Morrison  Procedure(s) Performed: Procedure(s) (LRB): RIGHT TOTAL KNEE ARTHROPLASTY (Right)     Patient location during evaluation: PACU Anesthesia Type: General Level of consciousness: awake and alert Pain management: pain level controlled Vital Signs Assessment: post-procedure vital signs reviewed and stable Respiratory status: spontaneous breathing, nonlabored ventilation, respiratory function stable and patient connected to nasal cannula oxygen Cardiovascular status: blood pressure returned to baseline and stable Postop Assessment: no apparent nausea or vomiting Anesthetic complications: no Comments: Initially with some emergence delirium in PACU, calmed with redirection and small doses of midazolam, now back to baseline mental status.    Last Vitals:  Vitals:   07/11/17 1100 07/11/17 1115  BP: 126/62 133/61  Pulse: 80 79  Resp: 14 19  Temp:  36.7 C  SpO2: 97% 95%    Last Pain:  Vitals:   07/11/17 1115  TempSrc:   PainSc: Asleep    LLE Motor Response: Purposeful movement (07/11/17 1115) LLE Sensation: Full sensation (07/11/17 1115) RLE Motor Response: Purposeful movement (07/11/17 1115) RLE Sensation: Full sensation (07/11/17 1115)      Audry Pili

## 2017-07-11 NOTE — Evaluation (Signed)
Physical Therapy Evaluation Patient Details Name: Alexandra Morrison MRN: [id number] DOB: 1946-09-05 Today's Date: 07/11/2017   History of Present Illness  Pt s/p R TKR and with hx of bipolar, Bil TSR and cervical fusion  Clinical Impression  Pt s/p R TKR and presents with decreased R LE strength/ROM and post op pain limiting functional mobility.  Pt should progress to dc home with family assist.    Follow Up Recommendations DC plan and follow up therapy as arranged by surgeon    Equipment Recommendations  None recommended by PT    Recommendations for Other Services OT consult     Precautions / Restrictions Precautions Precautions: Fall;Knee Required Braces or Orthoses: Knee Immobilizer - Right Knee Immobilizer - Right: Discontinue once straight leg raise with < 10 degree lag Restrictions Weight Bearing Restrictions: No Other Position/Activity Restrictions: WBAT      Mobility  Bed Mobility Overal bed mobility: Needs Assistance Bed Mobility: Supine to Sit     Supine to sit: Min assist;Mod assist     General bed mobility comments: cues for sequence and use of L LE to self assist  Transfers Overall transfer level: Needs assistance Equipment used: Rolling walker (2 wheeled) Transfers: Sit to/from Stand Sit to Stand: Min assist;Mod assist         General transfer comment: cues for LE management and use of UEs to self assist  Ambulation/Gait Ambulation/Gait assistance: Min assist;Mod assist Ambulation Distance (Feet): 14 Feet Assistive device: Rolling walker (2 wheeled) Gait Pattern/deviations: Step-to pattern;Decreased step length - right;Decreased step length - left;Shuffle;Trunk flexed Gait velocity: decr Gait velocity interpretation: Below normal speed for age/gender General Gait Details: cues for sequence, posture and position from [employer]            Wheelchair Mobility    Modified Rankin (Stroke Patients Only)       Balance                                              Pertinent Vitals/Pain Pain Assessment: 0-10 Pain Score: 4  Pain Location: R knee Pain Descriptors / Indicators: Aching;Sore Pain Intervention(s): Limited activity within patient's tolerance;Monitored during session;Premedicated before session;Ice applied    Home Living Family/patient expects to be discharged to:: Private residence Living Arrangements: Spouse/significant other Available Help at Discharge: Family Type of Home: House Home Access: Stairs to enter   Technical brewer of Steps: 1+1 Home Layout: One level Home Equipment: Environmental consultant - 2 wheels;Bedside commode      Prior Function Level of Independence: Independent               Hand Dominance        Extremity/Trunk Assessment   Upper Extremity Assessment Upper Extremity Assessment: Overall WFL for tasks assessed    Lower Extremity Assessment Lower Extremity Assessment: RLE deficits/detail       Communication   Communication: No difficulties  Cognition Arousal/Alertness: Awake/alert Behavior During Therapy: WFL for tasks assessed/performed Overall Cognitive Status: Within Functional Limits for tasks assessed                                        General Comments      Exercises Total Joint Exercises Ankle Circles/Pumps: AROM;Both;15 reps;Supine   Assessment/Plan    PT Assessment Patient  needs continued PT services  PT Problem List Decreased strength;Decreased range of motion;Decreased activity tolerance;Decreased mobility;Decreased knowledge of use of DME;Pain       PT Treatment Interventions DME instruction;Gait training;Stair training;Functional mobility training;Therapeutic activities;Therapeutic exercise;Patient/family education    PT Goals (Current goals can be found in the Care Plan section)  Acute Rehab PT Goals Patient Stated Goal: Regain IND and walk without pain PT Goal Formulation: With patient Time For Goal  Achievement: 07/15/17 Potential to Achieve Goals: Good    Frequency 7X/week   Barriers to discharge        Co-evaluation               AM-PAC PT "6 Clicks" Daily Activity  Outcome Measure Difficulty turning over in bed (including adjusting bedclothes, sheets and blankets)?: Unable Difficulty moving from lying on back to sitting on the side of the bed? : Unable Difficulty sitting down on and standing up from a chair with arms (e.g., wheelchair, bedside commode, etc,.)?: Unable Help needed moving to and from a bed to chair (including a wheelchair)?: A Little Help needed walking in hospital room?: A Little Help needed climbing 3-5 steps with a railing? : A Lot 6 Click Score: 11    End of Session Equipment Utilized During Treatment: Gait belt;Right knee immobilizer Activity Tolerance: Patient tolerated treatment well;Patient limited by fatigue Patient left: in chair;with call bell/phone within reach;with chair alarm set Nurse Communication: Mobility status PT Visit Diagnosis: Difficulty in walking, not elsewhere classified (R26.2)    Time: 5035-4656 PT Time Calculation (min) (ACUTE ONLY): 23 min   Charges:   PT Evaluation $PT Eval Low Complexity: 1 Low PT Treatments $Gait Training: 8-22 mins   PT G Codes:        Pg [phone]   Wake Conlee 07/11/2017, 5:12 PM

## 2017-07-11 NOTE — Progress Notes (Signed)
Assisted Dr. Brock with right, ultrasound guided, adductor canal block. Side rails up, monitors on throughout procedure. See vital signs in flow sheet. Tolerated Procedure well.  

## 2017-07-11 NOTE — Transfer of Care (Signed)
Immediate Anesthesia Transfer of Care Note  Patient: Alexandra Morrison  Procedure(s) Performed: Procedure(s) with comments: RIGHT TOTAL KNEE ARTHROPLASTY (Right) - 150 mins  Patient Location: PACU  Anesthesia Type:General  Level of Consciousness:  sedated, patient cooperative and responds to stimulation  Airway & Oxygen Therapy:Patient Spontanous Breathing and Patient connected to face mask oxgen  Post-op Assessment:  Report given to PACU RN and Post -op Vital signs reviewed and stable  Post vital signs:  Reviewed and stable  Last Vitals:  Vitals:   07/11/17 0529  BP: 132/63  Pulse: 94  Resp: 16  Temp: 37.3 C  SpO2: 03%    Complications: No apparent anesthesia complications

## 2017-07-11 NOTE — Anesthesia Procedure Notes (Signed)
Spinal  Patient location during procedure: OR Start time: 07/11/2017 7:38 AM End time: 07/11/2017 7:58 AM Staffing Anesthesiologist: Renold Don E Performed: anesthesiologist  Preanesthetic Checklist Completed: patient identified, surgical consent, pre-op evaluation, timeout performed, IV checked, risks and benefits discussed and monitors and equipment checked Spinal Block Patient position: sitting Prep: DuraPrep Patient monitoring: heart rate, cardiac monitor, continuous pulse ox and blood pressure Approach: midline Location: L3-4 Injection technique: single-shot Needle Needle type: Pencan and Tuohy  Needle gauge: 25 G Additional Notes Functioning IV was confirmed and monitors were applied. Consent was obtained prior to the procedure with all questions answered and concerns addressed. Risks including, but not limited to, bleeding, infection, nerve damage, paralysis, failed block, inadequate analgesia, allergic reaction, high spinal, itching, and headache were discussed and the patient wished to proceed. Sterile prep and drape, including hand hygiene, mask, and sterile gloves were used. The patient was positioned and the spine was prepped. The skin was anesthetized with lidocaine. Large adipose pad on patient's lower back made identification of landmarks impossible by feel. First attempt with normal spinal kit, introducer and 24ga sprotte needle, unsuccessful despite multiple redirections. Second attempt with tuohy to identify epidural space, also unsuccessful. After approx 20 minutes, decision made to abort procedure and move forward with general anesthetic. The patient tolerated the procedure well.   Renold Don, MD

## 2017-07-11 NOTE — Interval H&P Note (Signed)
History and Physical Interval Note:  07/11/2017 7:26 AM  Alexandra Morrison  has presented today for surgery, with the diagnosis of Degenerative joint disease right knee  The various methods of treatment have been discussed with the patient and family. After consideration of risks, benefits and other options for treatment, the patient has consented to  Procedure(s) with comments: RIGHT TOTAL KNEE ARTHROPLASTY (Right) - 150 mins as a surgical intervention .  The patient's history has been reviewed, patient examined, no change in status, stable for surgery.  I have reviewed the patient's chart and labs.  Questions were answered to the patient's satisfaction.     [name] C

## 2017-07-11 NOTE — Brief Op Note (Signed)
07/11/2017  9:36 AM  PATIENT:  Alexandra Morrison  71 y.o. female  PRE-OPERATIVE DIAGNOSIS:  degenerative joint disease right knee  POST-OPERATIVE DIAGNOSIS:  degenerative joint disease right knee  PROCEDURE:  Procedure(s) with comments: RIGHT TOTAL KNEE ARTHROPLASTY (Right) - 150 mins  SURGEON:  Surgeon(s) and Role:    Susa Day, MD - Primary  PHYSICIAN ASSISTANT:   ASSISTANTS: Bissell   ANESTHESIA:   general  EBL:  Total I/O In: 1000 [I.V.:1000] Out: 125 [Urine:50; Blood:75]  BLOOD ADMINISTERED:none  DRAINS: none   LOCAL MEDICATIONS USED:  MARCAINE     SPECIMEN:  No Specimen  DISPOSITION OF SPECIMEN:  N/A  COUNTS:  YES  TOURNIQUET:   Total Tourniquet Time Documented: Thigh (Right) - 55 minutes Total: Thigh (Right) - 55 minutes   DICTATION: .Other Dictation: Dictation Number [id number]  PLAN OF CARE: Admit to inpatient   PATIENT DISPOSITION:  PACU - hemodynamically stable.   Delay start of Pharmacological VTE agent (>24hrs) due to surgical blood loss or risk of bleeding: no

## 2017-07-11 NOTE — Anesthesia Procedure Notes (Signed)
Procedure Name: Intubation Date/Time: 07/11/2017 8:05 AM Performed by: Talbot Grumbling Pre-anesthesia Checklist: Patient identified, Emergency Drugs available, Suction available and Patient being monitored Patient Re-evaluated:Patient Re-evaluated prior to induction Oxygen Delivery Method: Circle system utilized Preoxygenation: Pre-oxygenation with 100% oxygen Induction Type: IV induction Ventilation: Mask ventilation without difficulty Laryngoscope Size: Miller and 2 Grade View: Grade I Tube type: Oral Tube size: 7.0 mm Airway Equipment and Method: Stylet Placement Confirmation: ETT inserted through vocal cords under direct vision,  positive ETCO2 and breath sounds checked- equal and bilateral Secured at: 21 cm Tube secured with: Tape Dental Injury: Teeth and Oropharynx as per pre-operative assessment

## 2017-07-12 LAB — BASIC METABOLIC PANEL
ANION GAP: 7 (ref 5–15)
BUN: 10 mg/dL (ref 6–20)
CALCIUM: 8.3 mg/dL — AB (ref 8.9–10.3)
CO2: 29 mmol/L (ref 22–32)
Chloride: 106 mmol/L (ref 101–111)
Creatinine, Ser: 0.73 mg/dL (ref 0.44–1.00)
GFR calc Af Amer: >60 mL/min (ref >60)
GLUCOSE: 110 mg/dL — AB (ref 65–99)
Potassium: 3.9 mmol/L (ref 3.5–5.1)
Sodium: 142 mmol/L (ref 135–145)

## 2017-07-12 LAB — CBC
HEMATOCRIT: 31.4 % — AB (ref 36.0–46.0)
Hemoglobin: 10.2 g/dL — ABNORMAL LOW (ref 12.0–15.0)
MCH: 31 pg (ref 26.0–34.0)
MCHC: 32.5 g/dL (ref 30.0–36.0)
MCV: 95.4 fL (ref 78.0–100.0)
PLATELETS: 179 10*3/uL (ref 150–400)
RBC: 3.29 MIL/uL — ABNORMAL LOW (ref 3.87–5.11)
RDW: 13.2 % (ref 11.5–15.5)
WBC: 10.9 10*3/uL — AB (ref 4.0–10.5)

## 2017-07-12 NOTE — Evaluation (Signed)
Occupational Therapy Evaluation Patient Details Name: Alexandra Morrison MRN: [id number] DOB: 12/21/1945 Today's Date: 07/12/2017    History of Present Illness Pt s/p R TKR and with hx of bipolar, Bil TSR and cervical fusion   Clinical Impression   Pt is s/p TKA resulting in the deficits listed below (see OT Problem List).  Pt will benefit from skilled OT to increase their safety and independence with ADL and functional mobility for ADL to facilitate discharge to venue listed below.        Follow Up Recommendations  No OT follow up    Equipment Recommendations  3 in 1 bedside commode    Recommendations for Other Services       Precautions / Restrictions Precautions Precautions: Fall;Knee Required Braces or Orthoses: Knee Immobilizer - Right Knee Immobilizer - Right: Discontinue once straight leg raise with < 10 degree lag Restrictions Weight Bearing Restrictions: No Other Position/Activity Restrictions: WBAT      Mobility Bed Mobility               General bed mobility comments: pt in chair  Transfers Overall transfer level: Needs assistance Equipment used: Rolling walker (2 wheeled) Transfers: Sit to/from [facility] Sit to Stand: Min assist Stand pivot transfers: Min assist       General transfer comment: cues for LE management and use of UEs to self assist        ADL either performed or assessed with clinical judgement   ADL Overall ADL's : Needs assistance/impaired Eating/Feeding: Set up;Sitting   Grooming: Set up;Sitting   Upper Body Bathing: Set up;Sitting   Lower Body Bathing: Moderate assistance;Cueing for safety;Cueing for sequencing;Sit to/from stand   Upper Body Dressing : Set up;Sitting   Lower Body Dressing: Moderate assistance;Sit to/from stand;Cueing for safety;Cueing for sequencing   Toilet Transfer: RW;BSC;Minimal assistance   Toileting- Clothing Manipulation and Hygiene: Minimal assistance;Sit to/from  stand;Cueing for safety;Cueing for sequencing         General ADL Comments: pain limiting pt this OT session     Vision Patient Visual Report: No change from baseline              Pertinent Vitals/Pain Pain Score: 8  Pain Location: R knee Pain Descriptors / Indicators: Aching;Sore Pain Intervention(s): Limited activity within patient's tolerance;Monitored during session;Repositioned;Ice applied;Patient requesting pain meds-RN notified     Hand Dominance     Extremity/Trunk Assessment Upper Extremity Assessment Upper Extremity Assessment: Overall WFL for tasks assessed           Communication Communication Communication: No difficulties   Cognition Arousal/Alertness: Awake/alert Behavior During Therapy: WFL for tasks assessed/performed Overall Cognitive Status: Within Functional Limits for tasks assessed                                                Home Living Family/patient expects to be discharged to:: Private residence Living Arrangements: Spouse/significant other Available Help at Discharge: Family Type of Home: House Home Access: Stairs to enter Technical brewer of Steps: 1+1   Home Layout: One level     Bathroom Shower/Tub: Walk-in shower;Tub/shower unit         Home Equipment: Walker - 2 wheels;Bedside commode          Prior Functioning/Environment Level of Independence: Independent  OT Problem List: Decreased strength;Pain;Decreased knowledge of use of DME or AE      OT Treatment/Interventions: Self-care/ADL training;Patient/family education;DME and/or AE instruction    OT Goals(Current goals can be found in the care plan section) Acute Rehab OT Goals Patient Stated Goal: Regain IND and walk without pain OT Goal Formulation: With patient Time For Goal Achievement: 07/19/17 Potential to Achieve Goals: Good  OT Frequency: Min 2X/week   Barriers to D/C:               AM-PAC PT "6  Clicks" Daily Activity     Outcome Measure Help from another person eating meals?: None Help from another person taking care of personal grooming?: None Help from another person toileting, which includes using toliet, bedpan, or urinal?: A Little Help from another person bathing (including washing, rinsing, drying)?: A Little Help from another person to put on and taking off regular upper body clothing?: None Help from another person to put on and taking off regular lower body clothing?: A Little 6 Click Score: 21   End of Session Equipment Utilized During Treatment: Rolling walker Nurse Communication: Mobility status  Activity Tolerance: Patient limited by pain Patient left: in chair;with call bell/phone within reach  OT Visit Diagnosis: Unsteadiness on feet (R26.81);Pain;Muscle weakness (generalized) (M62.81) Pain - Right/Left: Left Pain - part of body: Knee                Time: 1561-5379 OT Time Calculation (min): 14 min Charges:  OT General Charges $OT Visit: 1 Visit OT Evaluation $OT Eval Low Complexity: 1 Low G-Codes:     Kari Baars, [place]  Payton Mccallum D 07/12/2017, 11:22 AM

## 2017-07-12 NOTE — Progress Notes (Signed)
Physical Therapy Treatment Patient Details Name: Alexandra Morrison MRN: [id number] DOB: Mar 08, 1946 Today's Date: 07/12/2017    History of Present Illness Pt s/p R TKR and with hx of bipolar, Bil TSR and cervical fusion    PT Comments    POD # 1 am session Applied KI and instructed on use.  Assisted out of recliner to amb a limited distance in hallway.  Assisted to BR to void and transfer on/off toilet.  Returned to recliner and performed some TKR TE's followed by ICE.   Follow Up Recommendations  DC plan and follow up therapy as arranged by surgeon  OP PT     Equipment Recommendations  None recommended by PT    Recommendations for Other Services       Precautions / Restrictions Precautions Precautions: Fall;Knee Precaution Comments: instructed on Ki use for amb Required Braces or Orthoses: Knee Immobilizer - Right Knee Immobilizer - Right: Discontinue once straight leg raise with < 10 degree lag Restrictions Weight Bearing Restrictions: No Other Position/Activity Restrictions: WBAT    Mobility  Bed Mobility               General bed mobility comments: OOB in recliner  Transfers Overall transfer level: Needs assistance Equipment used: Rolling walker (2 wheeled) Transfers: Sit to/from [facility] Sit to Stand: Min assist;Min guard Stand pivot transfers: Min guard;Min assist       General transfer comment: cues for LE management and use of UEs to self assist  Ambulation/Gait Ambulation/Gait assistance: Min assist Ambulation Distance (Feet): 32 Feet Assistive device: Rolling walker (2 wheeled) Gait Pattern/deviations: Step-to pattern;Decreased step length - right;Decreased step length - left;Shuffle;Trunk flexed Gait velocity: decreased   General Gait Details: 25% cues for sequence, posture and position from [employer]            Wheelchair Mobility    Modified Rankin (Stroke Patients Only)       Balance                                             Cognition Arousal/Alertness: Awake/alert Behavior During Therapy: WFL for tasks assessed/performed Overall Cognitive Status: Within Functional Limits for tasks assessed                                        Exercises   Total Knee Replacement TE's 10 reps B LE ankle pumps 10 reps towel squeezes 10 reps knee presses 10 reps heel slides  10 reps SAQ's 10 reps SLR's 10 reps ABD Followed by ICE    General Comments        Pertinent Vitals/Pain Pain Assessment: 0-10 Pain Score: 4  Pain Location: R knee Pain Descriptors / Indicators: Aching;Sore;Operative site guarding Pain Intervention(s): Monitored during session;Repositioned    Home Living                      Prior Function            PT Goals (current goals can now be found in the care plan section) Progress towards PT goals: Progressing toward goals    Frequency    7X/week      PT Plan      Co-evaluation  AM-PAC PT "6 Clicks" Daily Activity  Outcome Measure  Difficulty turning over in bed (including adjusting bedclothes, sheets and blankets)?: Unable Difficulty moving from lying on back to sitting on the side of the bed? : Unable Difficulty sitting down on and standing up from a chair with arms (e.g., wheelchair, bedside commode, etc,.)?: Unable Help needed moving to and from a bed to chair (including a wheelchair)?: A Little Help needed walking in hospital room?: A Little Help needed climbing 3-5 steps with a railing? : A Lot 6 Click Score: 11    End of Session Equipment Utilized During Treatment: Gait belt Activity Tolerance: Patient tolerated treatment well;Patient limited by fatigue Patient left: in chair;with call bell/phone within reach;with chair alarm set Nurse Communication: Mobility status PT Visit Diagnosis: Difficulty in walking, not elsewhere classified (R26.2)     Time: 3664-4034 PT Time  Calculation (min) (ACUTE ONLY): 25 min  Charges:  $Gait Training: 8-22 mins $Therapeutic Exercise: 8-22 mins                    G Codes:       Rica Koyanagi  PTA [facility]  Acute  Rehab Pager      [phone]

## 2017-07-12 NOTE — Op Note (Signed)
NAMESHALIA, BARTKO NO.:  [id number]  MEDICAL RECORD NO.:  [id number]  LOCATION:                                 FACILITY:  PHYSICIAN:  Susa Day, M.D.         DATE OF BIRTH:  DATE OF PROCEDURE:  07/11/2017 DATE OF DISCHARGE:                              OPERATIVE REPORT   PREOPERATIVE DIAGNOSIS:  Degenerative joint disease, right knee.  POSTOPERATIVE DIAGNOSIS:  Degenerative joint disease, right knee.  PROCEDURE PERFORMED:  Right total knee arthroplasty utilizing DePuy Attune 4 femur, 4 tibia, 6 insert, 35 patella.  HISTORY:  A 70, bone-on-bone arthrosis, varus deformity, end-stage, refractory to conservative treatment, indicated for replacement of the degenerated joint with negative affect of her activities of daily living.  Risks and benefits were discussed including bleeding, infection, damage to the neurovascular structures, no changes in symptoms, worsening symptoms, DVT, PE, anesthetic complications, etc.  TECHNIQUE:  With the patient in supine position after induction of adequate general anesthesia, 2 g of Kefzol, the right lower extremity was prepped and draped, and exsanguinated in usual sterile fashion. Thigh tourniquet inflated to 250 mmHg.  Midline incision was then made over the skin over the patella, full-thickness flaps developed.  Median parapatellar arthrotomy performed.  Soft tissues elevated medially preserving the MCL.  Osteophytes removed with a rongeur.  Patella everted and knee flexed.  Tricompartmental bone-on-bone arthrosis was noted, particularly posteromedially.  Leksell was utilized to remove the osteophytes.  Remnants of the medial and lateral menisci were removed. Step drill was utilized to enter the femoral canal, appropriate manner, was irrigated, 5-degree right intramedullary guide with 9 off the distal femur was selected.  Pinned, cut performed.  We then sized her femur off the anterior cortex to a 4, 3 degrees of  external rotation and pinned and placed our block.  Anterior, posterior, and chamfer cuts were performed.  Soft tissue protected all times.  Subluxed the tibia. External alignment guide, 10 off the high side, 4-degree slope to catch posteromedially.  Bisecting the tibiotalar joint parallel to the shaft, pinned, performed our cut with an oscillating saw protecting the soft tissues.  We checked our extension gap, and it was satisfactory at 5.  I then completed the tibia utilizing 4 trial.  This was secured, drilled centrally, used our punch guide and then completed the box cut on the femur bisecting the canal.  Trial femur was placed, drilled our lug holes.  Placed a 5 insert, reduced it and had full extension and full flexion, slight minimal anterior drawer.  Everted the patella, measured to a 23, planed to a 15 utilizing 7.5 in the external alignment guide for the patella, performed our oscillating saw after we checked our template parallel to the joint line.  We drilled our peg holes medializing them, 35 was satisfactory reduced it, had excellent patellofemoral tracking.  We then removed all trials.  Checked posteriorly, popliteus intact.  Any loose bodies removed.  Geniculates cauterized.  Capsule intact.  Copiously irrigated with pulsatile lavage. Flexed the knee.  All surfaces were dried.  Subluxed the tibia.  Mixed cement in the back table in appropriate fashion, injected it into the  tibia, digitally pressurized and impacted, and cemented the tibial tray with cement on the tray.  Cemented and impacted the femur, redundant cement removed.  Placed the 5 insert, reduced it, held an axial load throughout the curing of the cement.  Cemented and clamped the patella as well.  After curing the cement, we let the tourniquet down to 55 minutes.  Cauterized any bleeders.  We then flexed the knee, meticulously removed all redundant cement.  I felt it was slide again, good stability with  varus-valgus stressing at 0-30 degrees.  Slight hyperextension.  I then selected a 6 permanent insert, removed the trial, copiously irrigated with antibiotic irrigation and pulsatile lavage.  Placed a 6 permanent insert, reduced it, had full extension, full flexion, good stability with varus-valgus stressing at 0-30 degrees.  Negative anterior drawer.  0.25% Marcaine with epinephrine was injected in the periosteum of the femur.  Reapproximated the patellar tendon.  #1 Vicryl interrupted figure-of-eight sutures, oversewn with Stratafix, and following that, we had excellent patellofemoral tracking. Subcu with 2 and skin with subcuticular Prolene.  She had flexion to gravity at 90 degrees, negative anterior drawer and excellent patellofemoral tracking.  Next, sterile dressing applied.  Placed an immobilizer and extubated without difficulty and transported to the recovery room in satisfactory condition.  The patient tolerated the procedure well.  No complications.  Assistant, Cleophas Dunker, [place].  Minimal blood loss.     Susa Day, M.D.   ______________________________ Susa Day, M.D.    Geralynn Rile  D:  07/11/2017  T:  07/11/2017  Job:  [id number]

## 2017-07-12 NOTE — Discharge Summary (Signed)
Physician Discharge Summary   Patient ID: Alexandra Morrison MRN: [id number] DOB/AGE: 06/15/1946 71 y.o.  Admit date: 07/11/2017 Discharge date: 07/13/17  Primary Diagnosis: right knee primary osteoarthritis Admission Diagnoses:  Past Medical History:  Diagnosis Date  . Arthritis   . Bipolar disorder ([place])   . History of sleep apnea    most recent study was negative for sleep apnea  . Hypothyroidism   . Numbness and tingling in hands   . Wears glasses    Discharge Diagnoses:   Principal Problem:   Primary osteoarthritis of right knee Active Problems:   Right knee DJD  Estimated body mass index is 25.71 kg/m as calculated from the following:   Height as of this encounter: 4' 10"  (1.473 m).   Weight as of this encounter: 55.8 kg (123 lb).  Procedure:  Procedure(s) (LRB): RIGHT TOTAL KNEE ARTHROPLASTY (Right)   Consults: None  HPI: see H&P Laboratory Data: Admission on 07/11/2017  Component Date Value Ref Range Status  . WBC 07/12/2017 10.9* 4.0 - 10.5 K/uL Final  . RBC 07/12/2017 3.29* 3.87 - 5.11 MIL/uL Final  . Hemoglobin 07/12/2017 10.2* 12.0 - 15.0 g/dL Final  . HCT 07/12/2017 31.4* 36.0 - 46.0 % Final  . MCV 07/12/2017 95.4  78.0 - 100.0 fL Final  . MCH 07/12/2017 31.0  26.0 - 34.0 pg Final  . MCHC 07/12/2017 32.5  30.0 - 36.0 g/dL Final  . RDW 07/12/2017 13.2  11.5 - 15.5 % Final  . Platelets 07/12/2017 179  150 - 400 K/uL Final  . Sodium 07/12/2017 142  135 - 145 mmol/L Final  . Potassium 07/12/2017 3.9  3.5 - 5.1 mmol/L Final  . Chloride 07/12/2017 106  101 - 111 mmol/L Final  . CO2 07/12/2017 29  22 - 32 mmol/L Final  . Glucose, Bld 07/12/2017 110* 65 - 99 mg/dL Final  . BUN 07/12/2017 10  6 - 20 mg/dL Final  . Creatinine, Ser 07/12/2017 0.73  0.44 - 1.00 mg/dL Final  . Calcium 07/12/2017 8.3* 8.9 - 10.3 mg/dL Final  . GFR calc non Af Amer 07/12/2017 >60  >60 mL/min Final  . GFR calc Af Amer 07/12/2017 >60  >60 mL/min Final   Comment: (NOTE) The eGFR  has been calculated using the CKD EPI equation. This calculation has not been validated in all clinical situations. eGFR's persistently <60 mL/min signify possible Chronic Kidney Disease.   Alexandra Morrison gap 07/12/2017 7  5 - 15 Final  Hospital Outpatient Visit on 07/02/2017  Component Date Value Ref Range Status  . aPTT 07/02/2017 25  24 - 36 seconds Final  . Sodium 07/02/2017 138  135 - 145 mmol/L Final  . Potassium 07/02/2017 3.1* 3.5 - 5.1 mmol/L Final  . Chloride 07/02/2017 102  101 - 111 mmol/L Final  . CO2 07/02/2017 28  22 - 32 mmol/L Final  . Glucose, Bld 07/02/2017 101* 65 - 99 mg/dL Final  . BUN 07/02/2017 14  6 - 20 mg/dL Final  . Creatinine, Ser 07/02/2017 0.89  0.44 - 1.00 mg/dL Final  . Calcium 07/02/2017 8.6* 8.9 - 10.3 mg/dL Final  . GFR calc non Af Amer 07/02/2017 >60  >60 mL/min Final  . GFR calc Af Amer 07/02/2017 >60  >60 mL/min Final   Comment: (NOTE) The eGFR has been calculated using the CKD EPI equation. This calculation has not been validated in all clinical situations. eGFR's persistently <60 mL/min signify possible Chronic Kidney Disease.   . Anion gap 07/02/2017 8  5 - 15 Final  . WBC 07/02/2017 7.1  4.0 - 10.5 K/uL Final  . RBC 07/02/2017 3.78* 3.87 - 5.11 MIL/uL Final  . Hemoglobin 07/02/2017 11.9* 12.0 - 15.0 g/dL Final  . HCT 07/02/2017 36.3  36.0 - 46.0 % Final  . MCV 07/02/2017 96.0  78.0 - 100.0 fL Final  . MCH 07/02/2017 31.5  26.0 - 34.0 pg Final  . MCHC 07/02/2017 32.8  30.0 - 36.0 g/dL Final  . RDW 07/02/2017 12.9  11.5 - 15.5 % Final  . Platelets 07/02/2017 213  150 - 400 K/uL Final  . Prothrombin Time 07/02/2017 11.7  11.4 - 15.2 seconds Final  . INR 07/02/2017 0.87   Final  . Color, Urine 07/02/2017 YELLOW  YELLOW Final  . APPearance 07/02/2017 HAZY* CLEAR Final  . Specific Gravity, Urine 07/02/2017 1.024  1.005 - 1.030 Final  . pH 07/02/2017 5.0  5.0 - 8.0 Final  . Glucose, UA 07/02/2017 NEGATIVE  NEGATIVE mg/dL Final  . Hgb urine  dipstick 07/02/2017 NEGATIVE  NEGATIVE Final  . Bilirubin Urine 07/02/2017 NEGATIVE  NEGATIVE Final  . Ketones, ur 07/02/2017 5* NEGATIVE mg/dL Final  . Protein, ur 07/02/2017 30* NEGATIVE mg/dL Final  . Nitrite 07/02/2017 NEGATIVE  NEGATIVE Final  . Leukocytes, UA 07/02/2017 NEGATIVE  NEGATIVE Final  . RBC / HPF 07/02/2017 0-5  0 - 5 RBC/hpf Final  . WBC, UA 07/02/2017 0-5  0 - 5 WBC/hpf Final  . Bacteria, UA 07/02/2017 RARE* NONE SEEN Final  . Squamous Epithelial / LPF 07/02/2017 0-5* NONE SEEN Final  . Mucus 07/02/2017 PRESENT   Final  . Hyaline Casts, UA 07/02/2017 PRESENT   Final  . MRSA, PCR 07/02/2017 NEGATIVE  NEGATIVE Final  . Staphylococcus aureus 07/02/2017 NEGATIVE  NEGATIVE Final   Comment: (NOTE) The Xpert SA Assay (FDA approved for NASAL specimens in patients 4 years of age and older), is one component of a comprehensive surveillance program. It is not intended to diagnose infection nor to guide or monitor treatment.      X-Rays:Dg Knee 1-2 Views Right  Result Date: 07/11/2017 CLINICAL DATA:  Status post right total knee replacement EXAM: RIGHT KNEE - 1-2 VIEW COMPARISON:  None. FINDINGS: The right knee demonstrates a total knee arthroplasty without evidence of hardware failure complication. There is no significant joint effusion. There is no fracture or dislocation. The alignment is anatomic. Post-surgical changes noted in the surrounding soft tissues. Multiple ossified loose bodies in the posterior joint space. IMPRESSION: 1. Interval right total knee arthroplasty. 2. Multiple loose bodies in the posterior joint space. Electronically Signed   By: Alexandra Morrison   On: 07/11/2017 10:56    EKG: Orders placed or performed during the hospital encounter of 11/22/15  . EKG test  . EKG test     Hospital Course: Alexandra Morrison is a 71 y.o. who was admitted to [facility]. They were brought to the operating room on 07/11/2017 and underwent Procedure(s): RIGHT TOTAL  KNEE ARTHROPLASTY.  Patient tolerated the procedure well and was later transferred to the recovery room and then to the orthopaedic floor for postoperative care.  They were given PO and IV analgesics for pain control following their surgery.  They were given 24 hours of postoperative antibiotics of  Anti-infectives    Start     Dose/Rate Route Frequency Ordered Stop   07/11/17 1200  ceFAZolin (ANCEF) IVPB 2g/100 mL premix     2 g 200 mL/hr over 30 Minutes Intravenous  Every 6 hours 07/11/17 1148 07/12/17 0559   07/11/17 0830  polymyxin B 500,000 Units, bacitracin 50,000 Units in sodium chloride 0.9 % 500 mL irrigation  Status:  Discontinued       As needed 07/11/17 0831 07/11/17 0958   07/11/17 0634  ceFAZolin (ANCEF) 2-4 GM/100ML-% IVPB    Comments:  Marla Roe   : cabinet override      07/11/17 0634 07/11/17 0730   07/11/17 0529  ceFAZolin (ANCEF) IVPB 2g/100 mL premix     2 g 200 mL/hr over 30 Minutes Intravenous On call to O.R. 07/11/17 2878 07/11/17 0745     and started on DVT prophylaxis in the form of Aspirin, TED hose and SCDs.   PT and OT were ordered for total joint protocol.  Discharge planning consulted to help with postop disposition and equipment needs.  Patient had a good night on the evening of surgery.  They started to get up OOB with therapy on day one. Continued to work with therapy into day two.  By day two, the patient had progressed with therapy and meeting their goals.  Incision was healing well.  Patient was seen in rounds and was ready to go home.   Diet: Regular diet Activity:WBAT Follow-up:in 10-14 days Disposition - Home Discharged Condition: good    Allergies as of 07/12/2017   No Known Allergies     Medication List    STOP taking these medications   aspirin 81 MG tablet Replaced by:  aspirin EC 325 MG tablet   CHOLEST OFF PO   CINNAMON PO   Co Q 10 100 MG Caps   Fish Oil 1000 MG Caps   Turmeric 500 MG Caps     TAKE these medications     acetaminophen 500 MG tablet Commonly known as:  TYLENOL Take 500-1,000 mg by mouth every 6 (six) hours as needed for mild pain or headache.   alendronate 70 MG tablet Commonly known as:  FOSAMAX Take 70 mg by mouth once a week. Mondays. Take with a full glass of water on an empty stomach.   ALPRAZolam 0.5 MG tablet Commonly known as:  XANAX Take 0.5 mg by mouth 2 (two) times daily as needed for anxiety.   amphetamine-dextroamphetamine 30 MG tablet Commonly known as:  ADDERALL Take 30 mg by mouth 2 (two) times daily with a meal.   aspirin EC 325 MG tablet Take 1 tablet (325 mg total) by mouth 2 (two) times daily. Replaces:  aspirin 81 MG tablet   b complex vitamins tablet Take 1 tablet by mouth daily.   Biotin 5000 MCG Tabs Take 1,000 mcg by mouth daily.   CALCIUM 600 + D PO Take 1 tablet by mouth 2 (two) times daily.   cholecalciferol 1000 units tablet Commonly known as:  VITAMIN D Take 1,000 Units by mouth See admin instructions. Takes 1000 units every third day.   divalproex 500 MG 24 hr tablet Commonly known as:  DEPAKOTE ER Take 500 mg by mouth daily.   docusate sodium 100 MG capsule Commonly known as:  COLACE Take 1 capsule (100 mg total) by mouth 2 (two) times daily as needed for mild constipation.   DULoxetine 30 MG capsule Commonly known as:  CYMBALTA Take 30 mg by mouth every evening.   furosemide 20 MG tablet Commonly known as:  LASIX Take 20-40 mg by mouth 2 (two) times daily as needed for fluid.   GLUCOSAMINE-CHONDROITIN PO Take 1 capsule by mouth 2 (two) times  daily.   HYDROcodone-acetaminophen 5-325 MG tablet Commonly known as:  NORCO/VICODIN Take 1 tablet by mouth every 4 (four) hours as needed for severe pain.   Iron 28 MG Tabs Take 28 mg by mouth at bedtime.   levothyroxine 50 MCG tablet Commonly known as:  SYNTHROID, LEVOTHROID Take 50 mcg by mouth daily before breakfast.   magnesium oxide 400 MG tablet Commonly known as:   MAG-OX Take 400 mg by mouth daily.   meloxicam 7.5 MG tablet Commonly known as:  MOBIC Take 7.5 mg by mouth 2 (two) times daily as needed for pain.   multivitamin with minerals tablet Take 1 tablet by mouth daily.   niacin 500 MG tablet Take 500 mg by mouth daily.   PARoxetine 30 MG tablet Commonly known as:  PAXIL Take 60 mg by mouth daily.   polyethylene glycol packet Commonly known as:  MIRALAX / GLYCOLAX Take 17 g by mouth daily.   PROBIOTIC DAILY PO Take 1 capsule by mouth daily.   traZODone 100 MG tablet Commonly known as:  DESYREL Take 50 mg by mouth at bedtime as needed for sleep.   vitamin B-12 1000 MCG tablet Commonly known as:  CYANOCOBALAMIN Take 1,000 mcg by mouth every other day.            Discharge Care Instructions        Start     Ordered   07/11/17 0000  docusate sodium (COLACE) 100 MG capsule  2 times daily PRN     07/11/17 0951   07/11/17 0000  polyethylene glycol (MIRALAX / GLYCOLAX) packet  Daily     07/11/17 0951   07/11/17 0000  aspirin EC 325 MG tablet  2 times daily     07/11/17 0951   07/11/17 0000  HYDROcodone-acetaminophen (NORCO/VICODIN) 5-325 MG tablet  Every 4 hours PRN     07/11/17 0951     Follow-up Information    Susa Day, MD Follow up in 2 week(s).   Specialty:  Orthopedic Surgery Contact information: [address] [place] [address] [phone]        Susa Day, MD In 2 weeks.   Specialty:  Orthopedic Surgery Contact information: [address] [place] [address] [phone]           Signed: Lacie Draft, PA-C Orthopaedic Surgery 07/12/2017, 9:37 AM

## 2017-07-12 NOTE — Progress Notes (Signed)
Patient ID: Alexandra Morrison, female   DOB: 12-29-1945, 71 y.o.   MRN: [id number]  Subjective: 1 Day Post-Op Procedure(s) (LRB): RIGHT TOTAL KNEE ARTHROPLASTY (Right) Patient reports pain as mild.    Patient has complaints of Right knee pain  We will start therapy today. Plan is to go home after hospital stay and start outpt PT immediately next week.  Objective: Vital signs in last 24 hours: Temp:  [97.6 F (36.4 C)-98.6 F (37 C)] 98.5 F (36.9 C) (09/21 0525) Pulse Rate:  [39-87] 84 (09/21 0525) Resp:  [14-20] 16 (09/21 0525) BP: (103-137)/(39-68) 103/39 (09/21 0525) SpO2:  [91 %-100 %] 95 % (09/21 0525)  Intake/Output from previous day:  Intake/Output Summary (Last 24 hours) at 07/12/17 0935 Last data filed at 07/12/17 0600  Gross per 24 hour  Intake          2219.17 ml  Output             1940 ml  Net           279.17 ml    Intake/Output this shift: No intake/output data recorded.  Labs: Results for orders placed or performed during the hospital encounter of 07/11/17  CBC  Result Value Ref Range   WBC 10.9 (H) 4.0 - 10.5 K/uL   RBC 3.29 (L) 3.87 - 5.11 MIL/uL   Hemoglobin 10.2 (L) 12.0 - 15.0 g/dL   HCT 31.4 (L) 36.0 - 46.0 %   MCV 95.4 78.0 - 100.0 fL   MCH 31.0 26.0 - 34.0 pg   MCHC 32.5 30.0 - 36.0 g/dL   RDW 13.2 11.5 - 15.5 %   Platelets 179 150 - 400 K/uL  Basic metabolic panel  Result Value Ref Range   Sodium 142 135 - 145 mmol/L   Potassium 3.9 3.5 - 5.1 mmol/L   Chloride 106 101 - 111 mmol/L   CO2 29 22 - 32 mmol/L   Glucose, Bld 110 (H) 65 - 99 mg/dL   BUN 10 6 - 20 mg/dL   Creatinine, Ser 0.73 0.44 - 1.00 mg/dL   Calcium 8.3 (L) 8.9 - 10.3 mg/dL   GFR calc non Af Amer >60 >60 mL/min   GFR calc Af Amer >60 >60 mL/min   Anion gap 7 5 - 15    Exam - Neurologically intact ABD soft Neurovascular intact Sensation intact distally Intact pulses distally Dorsiflexion/Plantar flexion intact Incision: dressing C/D/I and no drainage No cellulitis  present Compartment soft no calf pain or sign of DVT Dressing - clean, dry, no drainage Motor function intact - moving foot and toes well on exam.   Assessment/Plan: 1 Day Post-Op Procedure(s) (LRB): RIGHT TOTAL KNEE ARTHROPLASTY (Right)  Advance diet Up with therapy D/C IV fluids Past Medical History:  Diagnosis Date  . Arthritis   . Bipolar disorder ([place])   . History of sleep apnea    most recent study was negative for sleep apnea  . Hypothyroidism   . Numbness and tingling in hands   . Wears glasses     DVT Prophylaxis - ASA 325mg  BID Protocol Weight-Bearing as tolerated to right leg No vaccines Plan D/C to home when ready, possibly tomorrow if progressing well with PT and pain controlled Outpt PT scheduled for early next week Discussed with Dr. Mliss Fritz, [place]. 07/12/2017, 9:35 AM

## 2017-07-12 NOTE — Progress Notes (Signed)
Discharge planning, no HH needs identified. Plan for OP PT, has DME. [phone] 

## 2017-07-13 LAB — CBC
HCT: 29.1 % — ABNORMAL LOW (ref 36.0–46.0)
Hemoglobin: 9.9 g/dL — ABNORMAL LOW (ref 12.0–15.0)
MCH: 32.9 pg (ref 26.0–34.0)
MCHC: 34 g/dL (ref 30.0–36.0)
MCV: 96.7 fL (ref 78.0–100.0)
PLATELETS: 159 10*3/uL (ref 150–400)
RBC: 3.01 MIL/uL — ABNORMAL LOW (ref 3.87–5.11)
RDW: 13.2 % (ref 11.5–15.5)
WBC: 12.7 10*3/uL — ABNORMAL HIGH (ref 4.0–10.5)

## 2017-07-13 NOTE — Progress Notes (Signed)
Occupational Therapy Treatment Patient Details Name: Alexandra Morrison MRN: [id number] DOB: [birth year] Today's Date: 07/13/2017    History of present illness Pt s/p R TKR and with hx of bipolar, Bil TSR and cervical fusion   OT comments  All education completed and pt questions answered. No further OT needs at this time. Will sign off.  Follow Up Recommendations  No OT follow up    Equipment Recommendations  3 in 1 bedside commode    Recommendations for Other Services      Precautions / Restrictions Precautions Precautions: Fall;Knee Required Braces or Orthoses: Knee Immobilizer - Right Knee Immobilizer - Right: Discontinue once straight leg raise with < 10 degree lag Restrictions Weight Bearing Restrictions: No Other Position/Activity Restrictions: WBAT       Mobility Bed Mobility               General bed mobility comments: OOB in recliner  Transfers Overall transfer level: Needs assistance Equipment used: Rolling walker (2 wheeled) Transfers: Sit to/from Stand Sit to Stand: Min guard         General transfer comment: cues for LE management and use of UEs to self assist    Balance                                           ADL either performed or assessed with clinical judgement   ADL Overall ADL's : Needs assistance/impaired Eating/Feeding: Independent   Grooming: Oral care;Wash/dry hands;Min guard;Standing           Upper Body Dressing : Set up;Sitting       Toilet Transfer: Min guard;Ambulation;Comfort height toilet;Grab bars;RW   Toileting- Water quality scientist and Hygiene: Min guard;Sit to/from Nurse, children's Details (indicate cue type and reason): discussed technique; patient familiar from helping her husband with this task after he had a hip replacement Functional mobility during ADLs: Min guard;Rolling walker       Vision       Perception     Praxis      Cognition Arousal/Alertness:  Awake/alert Behavior During Therapy: WFL for tasks assessed/performed Overall Cognitive Status: Within Functional Limits for tasks assessed                                          Exercises     Shoulder Instructions       General Comments      Pertinent Vitals/ Pain       Pain Assessment: 0-10 Pain Score: 3  Pain Location: R knee Pain Descriptors / Indicators: Aching;Sore;Operative site guarding Pain Intervention(s): Limited activity within patient's tolerance;Monitored during session  Home Living                                          Prior Functioning/Environment              Frequency           Progress Toward Goals  OT Goals(current goals can now be found in the care plan section)  Progress towards OT goals: Goals met/education completed, patient discharged from [facility] All goals met and education completed, patient  discharged from OT services    Co-evaluation                 AM-PAC PT "6 Clicks" Daily Activity     Outcome Measure   Help from another person eating meals?: None Help from another person taking care of personal grooming?: None Help from another person toileting, which includes using toliet, bedpan, or urinal?: A Little Help from another person bathing (including washing, rinsing, drying)?: A Little Help from another person to put on and taking off regular upper body clothing?: None Help from another person to put on and taking off regular lower body clothing?: A Little 6 Click Score: 21    End of Session Equipment Utilized During Treatment: Rolling walker;Right knee immobilizer  OT Visit Diagnosis: Unsteadiness on feet (R26.81);Pain;Muscle weakness (generalized) (M62.81) Pain - Right/Left: Right Pain - part of body: Knee   Activity Tolerance Patient tolerated treatment well   Patient Left in chair;with call bell/phone within reach   Nurse Communication          Time:  0277-4128 OT Time Calculation (min): 18 min  Charges: OT General Charges $OT Visit: 1 Visit OT Treatments $Self Care/Home Management : 8-22 mins     Tessy Pawelski A Myka Lukins 07/13/2017, 9:29 AM

## 2017-07-13 NOTE — Progress Notes (Signed)
Physical Therapy Treatment Patient Details Name: Alexandra Morrison MRN: [id number] DOB: [birth year] Today's Date: 07/13/2017    History of Present Illness Pt s/p R TKR and with hx of bipolar, Bil TSR and cervical fusion    PT Comments    Pt progressing well; recommend supervision for mobility at home for safety; pt feels ready to d/c home today  Follow Up Recommendations  DC plan and follow up therapy as arranged by surgeon     Equipment Recommendations  None recommended by PT    Recommendations for Other Services OT consult     Precautions / Restrictions Precautions Precautions: Fall;Knee Required Braces or Orthoses: Knee Immobilizer - Right Knee Immobilizer - Right: Discontinue once straight leg raise with < 10 degree lag Restrictions Other Position/Activity Restrictions: WBAT    Mobility  Bed Mobility               General bed mobility comments: OOB in recliner  Transfers Overall transfer level: Needs assistance Equipment used: Rolling walker (2 wheeled) Transfers: Sit to/from Stand Sit to Stand: Supervision;Min guard         General transfer comment: cues for LE management and use of UEs to self assist  Ambulation/Gait Ambulation/Gait assistance: Min guard Ambulation Distance (Feet): 65 Feet Assistive device: Rolling walker (2 wheeled) Gait Pattern/deviations: Step-to pattern;Decreased step length - right;Decreased step length - left;Shuffle;Trunk flexed     General Gait Details: cues for RW position   Stairs            Wheelchair Mobility    Modified Rankin (Stroke Patients Only)       Balance                                            Cognition Arousal/Alertness: Awake/alert Behavior During Therapy: WFL for tasks assessed/performed Overall Cognitive Status: Within Functional Limits for tasks assessed                                        Exercises Total Joint Exercises Ankle Circles/Pumps:  AROM;Both;15 reps;Supine Heel Slides: AAROM;Right;10 reps Hip ABduction/ADduction: AROM;AAROM;Right;10 reps Straight Leg Raises: AAROM;Strengthening;Right;20 reps    General Comments        Pertinent Vitals/Pain Pain Assessment: 0-10 Pain Score: 2  Pain Location: R knee Pain Descriptors / Indicators: Aching;Sore;Operative site guarding Pain Intervention(s): Limited activity within patient's tolerance;Monitored during session;Premedicated before session;Ice applied    Home Living                      Prior Function            PT Goals (current goals can now be found in the care plan section) Acute Rehab PT Goals Patient Stated Goal: Regain IND and walk without pain PT Goal Formulation: With patient Time For Goal Achievement: 07/15/17 Potential to Achieve Goals: Good Progress towards PT goals: Progressing toward goals    Frequency    7X/week      PT Plan Current plan remains appropriate    Co-evaluation              AM-PAC PT "6 Clicks" Daily Activity  Outcome Measure  Difficulty turning over in bed (including adjusting bedclothes, sheets and blankets)?: A Little Difficulty moving from lying on back to sitting  on the side of the bed? : A Little Difficulty sitting down on and standing up from a chair with arms (e.g., wheelchair, bedside commode, etc,.)?: A Little Help needed moving to and from a bed to chair (including a wheelchair)?: A Little Help needed walking in hospital room?: A Little Help needed climbing 3-5 steps with a railing? : A Little 6 Click Score: 18    End of Session Equipment Utilized During Treatment: Gait belt;Right knee immobilizer Activity Tolerance: Patient tolerated treatment well Patient left: in chair;with call bell/phone within reach;with chair alarm set Nurse Communication: Mobility status PT Visit Diagnosis: Difficulty in walking, not elsewhere classified (R26.2)     Time: 7703-4035 PT Time Calculation (min) (ACUTE  ONLY): 28 min  Charges:  $Gait Training: 8-22 mins $Therapeutic Exercise: 8-22 mins                    G CodesKenyon Ana, PT Pager: [phone] 07/13/2017    Kenyon Ana 07/13/2017, 1:28 PM

## 2017-07-13 NOTE — Progress Notes (Signed)
Alexandra Morrison  MRN: [id number] DOB/Age: 70-Nov-1947 70 y.o. [facility] Procedure: Procedure(s) (LRB): RIGHT TOTAL KNEE ARTHROPLASTY (Right)     Subjective: Up in chair, has eaten well this am. No BM yet but flatus. Had a panic attack this am relieved with xanax and she states this happens with every hospital admission. Better this am  Vital Signs Temp:  [97.5 F (36.4 C)-99.6 F (37.6 C)] 99.6 F (37.6 C) (09/22 0818) Pulse Rate:  [58-88] 81 (09/22 0818) Resp:  [16-18] 16 (09/22 0818) BP: (92-114)/(42-61) 92/52 (09/22 0818) SpO2:  [90 %-93 %] 93 % (09/22 0818)  Lab Results  Recent Labs  07/12/17 0527 07/13/17 0746  WBC 10.9* 12.7*  HGB 10.2* 9.9*  HCT 31.4* 29.1*  PLT 179 159   BMET  Recent Labs  07/12/17 0527  NA 142  K 3.9  CL 106  CO2 29  GLUCOSE 110*  BUN 10  CREATININE 0.73  CALCIUM 8.3*   INR  Date Value Ref Range Status  07/02/2017 0.87  Final     Exam Knee incision with aquacel in place Minimal swelling  NVI        Plan Up with PT today and DC home later today  [facility] PA-C  07/13/2017, 9:37 AM Contact # [phone]

## 2017-07-16 DIAGNOSIS — M179 Osteoarthritis of knee, unspecified: Secondary | ICD-10-CM | POA: Diagnosis not present

## 2017-07-16 DIAGNOSIS — M25561 Pain in right knee: Secondary | ICD-10-CM | POA: Diagnosis not present

## 2017-07-18 DIAGNOSIS — M25561 Pain in right knee: Secondary | ICD-10-CM | POA: Diagnosis not present

## 2017-07-18 DIAGNOSIS — M179 Osteoarthritis of knee, unspecified: Secondary | ICD-10-CM | POA: Diagnosis not present

## 2017-07-22 DIAGNOSIS — M179 Osteoarthritis of knee, unspecified: Secondary | ICD-10-CM | POA: Diagnosis not present

## 2017-07-22 DIAGNOSIS — M25561 Pain in right knee: Secondary | ICD-10-CM | POA: Diagnosis not present

## 2017-07-24 DIAGNOSIS — M179 Osteoarthritis of knee, unspecified: Secondary | ICD-10-CM | POA: Diagnosis not present

## 2017-07-24 DIAGNOSIS — Z471 Aftercare following joint replacement surgery: Secondary | ICD-10-CM | POA: Diagnosis not present

## 2017-07-24 DIAGNOSIS — Z96651 Presence of right artificial knee joint: Secondary | ICD-10-CM | POA: Diagnosis not present

## 2017-07-24 DIAGNOSIS — M25561 Pain in right knee: Secondary | ICD-10-CM | POA: Diagnosis not present

## 2017-07-26 DIAGNOSIS — M179 Osteoarthritis of knee, unspecified: Secondary | ICD-10-CM | POA: Diagnosis not present

## 2017-07-26 DIAGNOSIS — M25561 Pain in right knee: Secondary | ICD-10-CM | POA: Diagnosis not present

## 2017-07-29 DIAGNOSIS — M179 Osteoarthritis of knee, unspecified: Secondary | ICD-10-CM | POA: Diagnosis not present

## 2017-07-29 DIAGNOSIS — M25561 Pain in right knee: Secondary | ICD-10-CM | POA: Diagnosis not present

## 2017-07-31 DIAGNOSIS — M179 Osteoarthritis of knee, unspecified: Secondary | ICD-10-CM | POA: Diagnosis not present

## 2017-07-31 DIAGNOSIS — M25561 Pain in right knee: Secondary | ICD-10-CM | POA: Diagnosis not present

## 2017-08-02 DIAGNOSIS — M25561 Pain in right knee: Secondary | ICD-10-CM | POA: Diagnosis not present

## 2017-08-02 DIAGNOSIS — M179 Osteoarthritis of knee, unspecified: Secondary | ICD-10-CM | POA: Diagnosis not present

## 2017-08-05 DIAGNOSIS — M179 Osteoarthritis of knee, unspecified: Secondary | ICD-10-CM | POA: Diagnosis not present

## 2017-08-05 DIAGNOSIS — M25561 Pain in right knee: Secondary | ICD-10-CM | POA: Diagnosis not present

## 2017-08-07 DIAGNOSIS — M25561 Pain in right knee: Secondary | ICD-10-CM | POA: Diagnosis not present

## 2017-08-07 DIAGNOSIS — M179 Osteoarthritis of knee, unspecified: Secondary | ICD-10-CM | POA: Diagnosis not present

## 2017-08-09 DIAGNOSIS — M25561 Pain in right knee: Secondary | ICD-10-CM | POA: Diagnosis not present

## 2017-08-09 DIAGNOSIS — M179 Osteoarthritis of knee, unspecified: Secondary | ICD-10-CM | POA: Diagnosis not present

## 2017-08-12 DIAGNOSIS — M179 Osteoarthritis of knee, unspecified: Secondary | ICD-10-CM | POA: Diagnosis not present

## 2017-08-12 DIAGNOSIS — M25561 Pain in right knee: Secondary | ICD-10-CM | POA: Diagnosis not present

## 2017-08-14 DIAGNOSIS — M179 Osteoarthritis of knee, unspecified: Secondary | ICD-10-CM | POA: Diagnosis not present

## 2017-08-14 DIAGNOSIS — M25561 Pain in right knee: Secondary | ICD-10-CM | POA: Diagnosis not present

## 2017-08-19 DIAGNOSIS — M179 Osteoarthritis of knee, unspecified: Secondary | ICD-10-CM | POA: Diagnosis not present

## 2017-08-19 DIAGNOSIS — M25561 Pain in right knee: Secondary | ICD-10-CM | POA: Diagnosis not present

## 2017-08-21 DIAGNOSIS — Z96651 Presence of right artificial knee joint: Secondary | ICD-10-CM | POA: Diagnosis not present

## 2017-08-21 DIAGNOSIS — Z471 Aftercare following joint replacement surgery: Secondary | ICD-10-CM | POA: Diagnosis not present

## 2017-08-21 DIAGNOSIS — M25561 Pain in right knee: Secondary | ICD-10-CM | POA: Diagnosis not present

## 2017-08-21 DIAGNOSIS — M179 Osteoarthritis of knee, unspecified: Secondary | ICD-10-CM | POA: Diagnosis not present

## 2017-08-26 DIAGNOSIS — M25561 Pain in right knee: Secondary | ICD-10-CM | POA: Diagnosis not present

## 2017-08-26 DIAGNOSIS — M179 Osteoarthritis of knee, unspecified: Secondary | ICD-10-CM | POA: Diagnosis not present

## 2017-08-28 DIAGNOSIS — M25561 Pain in right knee: Secondary | ICD-10-CM | POA: Diagnosis not present

## 2017-08-28 DIAGNOSIS — M179 Osteoarthritis of knee, unspecified: Secondary | ICD-10-CM | POA: Diagnosis not present

## 2017-09-02 DIAGNOSIS — M179 Osteoarthritis of knee, unspecified: Secondary | ICD-10-CM | POA: Diagnosis not present

## 2017-09-02 DIAGNOSIS — M25561 Pain in right knee: Secondary | ICD-10-CM | POA: Diagnosis not present

## 2017-09-04 DIAGNOSIS — M179 Osteoarthritis of knee, unspecified: Secondary | ICD-10-CM | POA: Diagnosis not present

## 2017-09-04 DIAGNOSIS — M25561 Pain in right knee: Secondary | ICD-10-CM | POA: Diagnosis not present

## 2017-09-09 DIAGNOSIS — M179 Osteoarthritis of knee, unspecified: Secondary | ICD-10-CM | POA: Diagnosis not present

## 2017-09-09 DIAGNOSIS — M25561 Pain in right knee: Secondary | ICD-10-CM | POA: Diagnosis not present

## 2017-09-11 DIAGNOSIS — M179 Osteoarthritis of knee, unspecified: Secondary | ICD-10-CM | POA: Diagnosis not present

## 2017-09-11 DIAGNOSIS — M25561 Pain in right knee: Secondary | ICD-10-CM | POA: Diagnosis not present

## 2017-09-16 DIAGNOSIS — M179 Osteoarthritis of knee, unspecified: Secondary | ICD-10-CM | POA: Diagnosis not present

## 2017-09-16 DIAGNOSIS — M25561 Pain in right knee: Secondary | ICD-10-CM | POA: Diagnosis not present

## 2017-09-24 DIAGNOSIS — M179 Osteoarthritis of knee, unspecified: Secondary | ICD-10-CM | POA: Diagnosis not present

## 2017-09-24 DIAGNOSIS — M25561 Pain in right knee: Secondary | ICD-10-CM | POA: Diagnosis not present

## 2017-09-26 DIAGNOSIS — F3110 Bipolar disorder, current episode manic without psychotic features, unspecified: Secondary | ICD-10-CM | POA: Diagnosis not present

## 2017-10-16 DIAGNOSIS — E119 Type 2 diabetes mellitus without complications: Secondary | ICD-10-CM | POA: Diagnosis not present

## 2017-10-16 DIAGNOSIS — M810 Age-related osteoporosis without current pathological fracture: Secondary | ICD-10-CM | POA: Diagnosis not present

## 2017-10-16 DIAGNOSIS — M159 Polyosteoarthritis, unspecified: Secondary | ICD-10-CM | POA: Diagnosis not present

## 2017-10-16 DIAGNOSIS — Z0000 Encounter for general adult medical examination without abnormal findings: Secondary | ICD-10-CM | POA: Diagnosis not present

## 2017-10-16 DIAGNOSIS — R600 Localized edema: Secondary | ICD-10-CM | POA: Diagnosis not present

## 2017-10-16 DIAGNOSIS — E785 Hyperlipidemia, unspecified: Secondary | ICD-10-CM | POA: Diagnosis not present

## 2017-10-16 DIAGNOSIS — Z23 Encounter for immunization: Secondary | ICD-10-CM | POA: Diagnosis not present

## 2017-10-16 DIAGNOSIS — E039 Hypothyroidism, unspecified: Secondary | ICD-10-CM | POA: Diagnosis not present

## 2017-10-16 DIAGNOSIS — F319 Bipolar disorder, unspecified: Secondary | ICD-10-CM | POA: Diagnosis not present

## 2017-12-26 DIAGNOSIS — F3110 Bipolar disorder, current episode manic without psychotic features, unspecified: Secondary | ICD-10-CM | POA: Diagnosis not present

## 2018-01-23 DIAGNOSIS — F3110 Bipolar disorder, current episode manic without psychotic features, unspecified: Secondary | ICD-10-CM | POA: Diagnosis not present

## 2018-01-29 DIAGNOSIS — I8390 Asymptomatic varicose veins of unspecified lower extremity: Secondary | ICD-10-CM | POA: Diagnosis not present

## 2018-01-29 DIAGNOSIS — M1712 Unilateral primary osteoarthritis, left knee: Secondary | ICD-10-CM | POA: Diagnosis not present

## 2018-01-29 DIAGNOSIS — M25561 Pain in right knee: Secondary | ICD-10-CM | POA: Diagnosis not present

## 2018-01-29 DIAGNOSIS — Z96651 Presence of right artificial knee joint: Secondary | ICD-10-CM | POA: Diagnosis not present

## 2018-01-29 DIAGNOSIS — M25562 Pain in left knee: Secondary | ICD-10-CM | POA: Diagnosis not present

## 2018-02-03 DIAGNOSIS — M810 Age-related osteoporosis without current pathological fracture: Secondary | ICD-10-CM | POA: Diagnosis not present

## 2018-02-03 DIAGNOSIS — L989 Disorder of the skin and subcutaneous tissue, unspecified: Secondary | ICD-10-CM | POA: Diagnosis not present

## 2018-02-03 DIAGNOSIS — E039 Hypothyroidism, unspecified: Secondary | ICD-10-CM | POA: Diagnosis not present

## 2018-02-03 DIAGNOSIS — E785 Hyperlipidemia, unspecified: Secondary | ICD-10-CM | POA: Diagnosis not present

## 2018-02-03 DIAGNOSIS — M25862 Other specified joint disorders, left knee: Secondary | ICD-10-CM | POA: Diagnosis not present

## 2018-02-03 DIAGNOSIS — E119 Type 2 diabetes mellitus without complications: Secondary | ICD-10-CM | POA: Diagnosis not present

## 2018-02-03 DIAGNOSIS — Z01818 Encounter for other preprocedural examination: Secondary | ICD-10-CM | POA: Diagnosis not present

## 2018-02-12 ENCOUNTER — Ambulatory Visit: Payer: Self-pay | Admitting: Orthopedic Surgery

## 2018-02-12 DIAGNOSIS — M25562 Pain in left knee: Secondary | ICD-10-CM | POA: Diagnosis not present

## 2018-02-12 MED ORDER — ACETAMINOPHEN 10 MG/ML IV SOLN
1000.0000 mg | INTRAVENOUS | Status: AC
  Administered 2018-03-12: 1000 mg via INTRAVENOUS

## 2018-02-18 DIAGNOSIS — M545 Low back pain: Secondary | ICD-10-CM | POA: Diagnosis not present

## 2018-02-18 DIAGNOSIS — R102 Pelvic and perineal pain: Secondary | ICD-10-CM | POA: Diagnosis not present

## 2018-02-19 ENCOUNTER — Other Ambulatory Visit: Payer: Self-pay | Admitting: Physician Assistant

## 2018-02-19 DIAGNOSIS — R102 Pelvic and perineal pain: Secondary | ICD-10-CM

## 2018-02-21 ENCOUNTER — Ambulatory Visit
Admission: RE | Admit: 2018-02-21 | Discharge: 2018-02-21 | Disposition: A | Discharge status: Home / Self Care | Payer: Medicare Other | Source: Ambulatory Visit | Attending: Physician Assistant | Admitting: Physician Assistant

## 2018-02-21 DIAGNOSIS — M25552 Pain in left hip: Secondary | ICD-10-CM | POA: Diagnosis not present

## 2018-02-21 DIAGNOSIS — R102 Pelvic and perineal pain: Secondary | ICD-10-CM

## 2018-02-21 DIAGNOSIS — M1612 Unilateral primary osteoarthritis, left hip: Secondary | ICD-10-CM | POA: Diagnosis not present

## 2018-03-04 ENCOUNTER — Encounter (HOSPITAL_COMMUNITY): Payer: Self-pay

## 2018-03-04 NOTE — Patient Instructions (Addendum)
Your procedure is scheduled on: Wednesday, Mar 12, 2018   Surgery Time:  7:30AM-9:45AM   Report to [place]  Entrance    Report to admitting at 5:30 AM   Call this number if you have problems the morning of surgery [phone]   Do not eat food or drink liquids :After Midnight.   Do NOT smoke after Midnight   Take these medicines the morning of surgery with A SIP OF WATER: Wellbutrin, Depakote, Levothyroxine, Xanax if needed                               You may not have any metal on your body including hair pins, jewelry, and body piercings             Do not wear make-up, lotions, powders, perfumes/cologne, or deodorant             Do not wear nail polish.  Do not shave  48 hours prior to surgery.                Do not bring valuables to the hospital. [place].   Contacts, dentures or bridgework may not be worn into surgery.   Leave suitcase in the car. After surgery it may be brought to your room.   Special Instructions: Bring a copy of your healthcare power of attorney and living will documents         the day of surgery if you haven't scanned them in before.              Please read over the following fact sheets you were given:  [facility] - Preparing for Surgery Before surgery, you can play an important role.  Because skin is not sterile, your skin needs to be as free of germs as possible.  You can reduce the number of germs on your skin by washing with CHG (chlorahexidine gluconate) soap before surgery.  CHG is an antiseptic cleaner which kills germs and bonds with the skin to continue killing germs even after washing. Please DO NOT use if you have an allergy to CHG or antibacterial soaps.  If your skin becomes reddened/irritated stop using the CHG and inform your nurse when you arrive at Short Stay. Do not shave (including legs and underarms) for at least 48 hours prior to the first CHG shower.  You  may shave your face/neck.  Please follow these instructions carefully:  1.  Shower with CHG Soap the night before surgery and the  morning of surgery.  2.  If you choose to wash your hair, wash your hair first as usual with your normal  shampoo.  3.  After you shampoo, rinse your hair and body thoroughly to remove the shampoo.                             4.  Use CHG as you would any other liquid soap.  You can apply chg directly to the skin and wash.  Gently with a scrungie or clean washcloth.  5.  Apply the CHG Soap to your body ONLY FROM THE NECK DOWN.   Do   not use on face/ open  Wound or open sores. Avoid contact with eyes, ears mouth and   genitals (private parts).                       Wash face,  Genitals (private parts) with your normal soap.             6.  Wash thoroughly, paying special attention to the area where your    surgery  will be performed.  7.  Thoroughly rinse your body with warm water from the neck down.  8.  DO NOT shower/wash with your normal soap after using and rinsing off the CHG Soap.                9.  Pat yourself dry with a clean towel.            10.  Wear clean pajamas.            11.  Place clean sheets on your bed the night of your first shower and do not  sleep with pets. Day of Surgery : Do not apply any lotions/deodorants the morning of surgery.  Please wear clean clothes to the hospital/surgery center.  FAILURE TO FOLLOW THESE INSTRUCTIONS MAY RESULT IN THE CANCELLATION OF YOUR SURGERY  PATIENT SIGNATURE_________________________________  NURSE SIGNATURE__________________________________  ________________________________________________________________________   Adam Phenix  An incentive spirometer is a tool that can help keep your lungs clear and active. This tool measures how well you are filling your lungs with each breath. Taking long deep breaths may help reverse or decrease the chance of developing breathing  (pulmonary) problems (especially infection) following:  A long period of time when you are unable to move or be active. BEFORE THE PROCEDURE   If the spirometer includes an indicator to show your best effort, your nurse or respiratory therapist will set it to a desired goal.  If possible, sit up straight or lean slightly forward. Try not to slouch.  Hold the incentive spirometer in an upright position. INSTRUCTIONS FOR USE  1. Sit on the edge of your bed if possible, or sit up as far as you can in bed or on a chair. 2. Hold the incentive spirometer in an upright position. 3. Breathe out normally. 4. Place the mouthpiece in your mouth and seal your lips tightly around it. 5. Breathe in slowly and as deeply as possible, raising the piston or the ball toward the top of the column. 6. Hold your breath for 3-5 seconds or for as long as possible. Allow the piston or ball to fall to the bottom of the column. 7. Remove the mouthpiece from your mouth and breathe out normally. 8. Rest for a few seconds and repeat Steps 1 through 7 at least 10 times every 1-2 hours when you are awake. Take your time and take a few normal breaths between deep breaths. 9. The spirometer may include an indicator to show your best effort. Use the indicator as a goal to work toward during each repetition. 10. After each set of 10 deep breaths, practice coughing to be sure your lungs are clear. If you have an incision (the cut made at the time of surgery), support your incision when coughing by placing a pillow or rolled up towels firmly against it. Once you are able to get out of bed, walk around indoors and cough well. You may stop using the incentive spirometer when instructed by your caregiver.  RISKS AND COMPLICATIONS  Take your time  so you do not get dizzy or light-headed.  If you are in pain, you may need to take or ask for pain medication before doing incentive spirometry. It is harder to take a deep breath if you  are having pain. AFTER USE  Rest and breathe slowly and easily.  It can be helpful to keep track of a log of your progress. Your caregiver can provide you with a simple table to help with this. If you are using the spirometer at home, follow these instructions: [place] IF:   You are having difficultly using the spirometer.  You have trouble using the spirometer as often as instructed.  Your pain medication is not giving enough relief while using the spirometer.  You develop fever of 100.5 F (38.1 C) or higher. SEEK IMMEDIATE MEDICAL CARE IF:   You cough up bloody sputum that had not been present before.  You develop fever of 102 F (38.9 C) or greater.  You develop worsening pain at or near the incision site. MAKE SURE YOU:   Understand these instructions.  Will watch your condition.  Will get help right away if you are not doing well or get worse. Document Released: 02/18/2007 Document Revised: 12/31/2011 Document Reviewed: 04/21/2007 [facility] Patient Information 2014 [place], [place].   ________________________________________________________________________

## 2018-03-05 ENCOUNTER — Other Ambulatory Visit: Payer: Self-pay

## 2018-03-05 ENCOUNTER — Ambulatory Visit: Payer: Self-pay | Admitting: Orthopedic Surgery

## 2018-03-05 ENCOUNTER — Encounter (HOSPITAL_COMMUNITY): Payer: Self-pay

## 2018-03-05 ENCOUNTER — Encounter (HOSPITAL_COMMUNITY)
Admission: RE | Admit: 2018-03-05 | Discharge: 2018-03-05 | Disposition: A | Discharge status: Home / Self Care | Payer: Medicare Other | Source: Ambulatory Visit | Attending: Specialist | Admitting: Specialist

## 2018-03-05 DIAGNOSIS — Z01812 Encounter for preprocedural laboratory examination: Secondary | ICD-10-CM | POA: Diagnosis not present

## 2018-03-05 DIAGNOSIS — M1712 Unilateral primary osteoarthritis, left knee: Secondary | ICD-10-CM | POA: Diagnosis not present

## 2018-03-05 HISTORY — DX: Pure hypercholesterolemia, unspecified: E78.00

## 2018-03-05 HISTORY — DX: Atherosclerosis of aorta: I70.0

## 2018-03-05 HISTORY — DX: Disease of spinal cord, unspecified: G95.9

## 2018-03-05 HISTORY — DX: Other specified disorders of bone density and structure, unspecified site: M85.80

## 2018-03-05 HISTORY — DX: Anxiety disorder, unspecified: F41.9

## 2018-03-05 HISTORY — DX: Spinal stenosis, site unspecified: M48.00

## 2018-03-05 HISTORY — DX: Personal history of other infectious and parasitic diseases: Z86.19

## 2018-03-05 HISTORY — DX: Other complications of anesthesia, initial encounter: T88.59XA

## 2018-03-05 HISTORY — DX: Other intervertebral disc degeneration, lumbar region without mention of lumbar back pain or lower extremity pain: M51.369

## 2018-03-05 HISTORY — DX: Age-related osteoporosis without current pathological fracture: M81.0

## 2018-03-05 HISTORY — DX: Other intervertebral disc degeneration, lumbar region: M51.36

## 2018-03-05 HISTORY — DX: Scoliosis, unspecified: M41.9

## 2018-03-05 HISTORY — DX: Unspecified fracture of shaft of humerus, right arm, initial encounter for closed fracture: S42.301A

## 2018-03-05 HISTORY — DX: Adverse effect of unspecified anesthetic, initial encounter: T41.45XA

## 2018-03-05 HISTORY — DX: Dizziness and giddiness: R42

## 2018-03-05 HISTORY — DX: Personal history of other endocrine, nutritional and metabolic disease: Z86.39

## 2018-03-05 LAB — CBC
HCT: 35.2 % — ABNORMAL LOW (ref 36.0–46.0)
Hemoglobin: 11.3 g/dL — ABNORMAL LOW (ref 12.0–15.0)
MCH: 30.7 pg (ref 26.0–34.0)
MCHC: 32.1 g/dL (ref 30.0–36.0)
MCV: 95.7 fL (ref 78.0–100.0)
PLATELETS: 241 10*3/uL (ref 150–400)
RBC: 3.68 MIL/uL — ABNORMAL LOW (ref 3.87–5.11)
RDW: 13.2 % (ref 11.5–15.5)
WBC: 7.8 10*3/uL (ref 4.0–10.5)

## 2018-03-05 LAB — BASIC METABOLIC PANEL
Anion gap: 10 (ref 5–15)
BUN: 11 mg/dL (ref 6–20)
CHLORIDE: 106 mmol/L (ref 101–111)
CO2: 27 mmol/L (ref 22–32)
CREATININE: 0.81 mg/dL (ref 0.44–1.00)
Calcium: 8.6 mg/dL — ABNORMAL LOW (ref 8.9–10.3)
GFR calc Af Amer: >60 mL/min (ref >60)
GFR calc non Af Amer: >60 mL/min (ref >60)
Glucose, Bld: 97 mg/dL (ref 65–99)
Potassium: 3.4 mmol/L — ABNORMAL LOW (ref 3.5–5.1)
SODIUM: 143 mmol/L (ref 135–145)

## 2018-03-05 LAB — URINALYSIS, ROUTINE W REFLEX MICROSCOPIC
BILIRUBIN URINE: NEGATIVE
GLUCOSE, UA: NEGATIVE mg/dL
HGB URINE DIPSTICK: NEGATIVE
Ketones, ur: NEGATIVE mg/dL
Leukocytes, UA: NEGATIVE
Nitrite: NEGATIVE
Protein, ur: NEGATIVE mg/dL
SPECIFIC GRAVITY, URINE: 1.004 — AB (ref 1.005–1.030)
pH: 5 (ref 5.0–8.0)

## 2018-03-05 LAB — APTT: aPTT: 27 seconds (ref 24–36)

## 2018-03-05 LAB — SURGICAL PCR SCREEN
MRSA, PCR: NEGATIVE
Staphylococcus aureus: NEGATIVE

## 2018-03-05 LAB — PROTIME-INR
INR: 0.92
Prothrombin Time: 12.3 seconds (ref 11.4–15.2)

## 2018-03-05 NOTE — H&P (Signed)
Alexandra Morrison is an 72 y.o. female.   Chief Complaint: L knee pain HPI: Alexandra Morrison is scheduled for left total knee replacement with Dr. Tonita Cong on May 22. Her back has calmed down since her appointment with [facility]. She is still wearing her left knee hinged brace for support. She had her preop visit at [facility] long today. She had a general anesthetic last time and did well with that and is hoping for the same this time. She did well with oxycodone, Robaxin, Colace, MiraLAX last time with aspirin for DVT prophylaxis. She has no history of DVT.  She reports progressively worsening left knee pain chronic, refractory to conservative treatment including injection therapy, activity modifications, quad strengthening, bracing, medications, relative rest. At this point pain is interfering with her ability to walk, activities of daily living and quality-of-life and she desires to proceed with surgery.  Dr. Tonita Cong and the patient mutually agreed to proceed with a total knee replacement. Risks and benefits of the procedure were discussed including stiffness, suboptimal range of motion, persistent pain, infection requiring removal of prosthesis and reinsertion, need for prophylactic antibiotics in the future, for example, dental procedures, possible need for manipulation, revision in the future and also anesthetic complications including DVT, PE, etc. We discussed the perioperative course, time in the hospital, postoperative recovery and the need for elevation to control swelling. We also discussed the predicted range of motion and the probability that squatting and kneeling would be unobtainable in the future. In addition, postoperative anticoagulation was discussed. We have obtained preoperative medical clearance as necessary. Provided illustrated handout and discussed it in detail. They will enroll in the total joint replacement educational forum at the hospital.  Past Medical History:  Diagnosis Date  . Abdominal  aortic atherosclerosis (HCC)    Moderate  . Anxiety   . Arthritis   . Bipolar disorder ([place])    type 2 hypomanic  . Complication of anesthesia    pt request no spinal anesthesia only general  . DDD (degenerative disc disease), lumbar   . High cholesterol   . History of diabetes mellitus    resolved after weight loss  . History of mumps as a child   . History of sleep apnea    most recent study was negative for sleep apnea, resolved after weight loss   . Hypothyroidism   . Myelopathy ([place])   . Numbness and tingling in hands   . Osteopenia   . Osteoporosis   . Right arm fracture 1953   x2  . Scoliosis    per pt  . Spinal stenosis    Moderate to severe  . Vertigo   . Wears glasses     Past Surgical History:  Procedure Laterality Date  . APPENDECTOMY    . CARPAL TUNNEL RELEASE Bilateral   . [place]   x2  . EYE SURGERY Bilateral 2006   cataracts  . FRACTURE SURGERY     Arm  . GASTRIC BYPASS  2006  . POSTERIOR CERVICAL FUSION/FORAMINOTOMY N/A 11/25/2015   Procedure: Posterior Cervical Fusion with lateral mass fixation Cervical fiv-Thoracic one, cervical laminectomy  - Cervical six-Cervical seven - Cervical seven-Thoracic one;  Surgeon: Eustace Moore, MD;  Location: [place] NE[facility] ORS;  Service: Neurosurgery;  Laterality: N/A;  . TOTAL KNEE ARTHROPLASTY Right 07/11/2017   Procedure: RIGHT TOTAL KNEE ARTHROPLASTY;  Surgeon: Susa Day, MD;  Location: [facility] ORS;  Service: Orthopedics;  Laterality: Right;  150 mins  . TOTAL SHOULDER ARTHROPLASTY  Bilateral 2010, 2011   Medications: AMPHETAMINE/DEXTROAMPHETAMINE 30 MG TABS aspirin B Complex biotin calcium Cinnamon Co Q-10 Cymbalta Depakote dextroamphetamine-amphetamine 30 mg tablet Fosamax Glucosamine Chondroitin PLUS iron lovastatin magnesium oxide methocarbamol multivitamin Probiotic traMADol traZODone turmeric Tylenol Extra Strength Vitamin B12 Wellbutrin Xanax  No family history on  file. Social History:  reports that she quit smoking about 25 years ago. She has never used smokeless tobacco. She reports that she drinks alcohol. She reports that she does not use drugs. Smoking Status: Former smoker Tobacco-years of use: 3018 Chewing tobacco: none Alcohol intake: Occasional Hand Dominance: Right Work related injury?: N Advance directive: Y Freight forwarder of Attorney: Y  Allergies: No Known Allergies  Results for orders placed or performed during the hospital encounter of 03/05/18 (from the past 48 hour(s))  APTT     Status: None   Collection Time: 03/05/18  1:41 PM  Result Value Ref Range   aPTT 27 24 - 36 seconds    Comment: Performed at [facility], [place] [address], [place], [place] [address]  Basic metabolic panel     Status: Abnormal   Collection Time: 03/05/18  1:41 PM  Result Value Ref Range   Sodium 143 135 - 145 mmol/L   Potassium 3.4 (L) 3.5 - 5.1 mmol/L   Chloride 106 101 - 111 mmol/L   CO2 27 22 - 32 mmol/L   Glucose, Bld 97 65 - 99 mg/dL   BUN 11 6 - 20 mg/dL   Creatinine, Ser 0.81 0.44 - 1.00 mg/dL   Calcium 8.6 (L) 8.9 - 10.3 mg/dL   GFR calc non Af Amer >60 >60 mL/min   GFR calc Af Amer >60 >60 mL/min    Comment: (NOTE) The eGFR has been calculated using the CKD EPI equation. This calculation has not been validated in all clinical situations. eGFR's persistently <60 mL/min signify possible Chronic Kidney Disease.    Anion gap 10 5 - 15    Comment: Performed at [facility], [place] [address], [place], [place] [address]  CBC     Status: Abnormal   Collection Time: 03/05/18  1:41 PM  Result Value Ref Range   WBC 7.8 4.0 - 10.5 K/uL   RBC 3.68 (L) 3.87 - 5.11 MIL/uL   Hemoglobin 11.3 (L) 12.0 - 15.0 g/dL   HCT 35.2 (L) 36.0 - 46.0 %   MCV 95.7 78.0 - 100.0 fL   MCH 30.7 26.0 - 34.0 pg   MCHC 32.1 30.0 - 36.0 g/dL   RDW 13.2 11.5 - 15.5 %   Platelets 241 150 - 400 K/uL    Comment: Performed at [facility], [place] [address] [place], [place] [address]  Protime-INR     Status: None   Collection Time: 03/05/18  1:41 PM  Result Value Ref Range   Prothrombin Time 12.3 11.4 - 15.2 seconds   INR 0.92     Comment: Performed at [facility], [place] [address] [place], [place] [address]  Urinalysis, Routine w reflex microscopic     Status: Abnormal   Collection Time: 03/05/18  1:41 PM  Result Value Ref Range   Color, Urine COLORLESS (A) YELLOW   APPearance CLEAR CLEAR   Specific Gravity, Urine 1.004 (L) 1.005 - 1.030   pH 5.0 5.0 - 8.0   Glucose, UA NEGATIVE NEGATIVE mg/dL   Hgb urine dipstick NEGATIVE NEGATIVE   Bilirubin Urine NEGATIVE NEGATIVE   Ketones, ur NEGATIVE NEGATIVE mg/dL  Protein, ur NEGATIVE NEGATIVE mg/dL   Nitrite NEGATIVE NEGATIVE   Leukocytes, UA NEGATIVE NEGATIVE    Comment: Performed at [facility] [address] [place], [place] [address]   No results found.  Review of Systems  Constitutional: Negative.   HENT: Negative.   Eyes: Negative.   Respiratory: Negative.   Cardiovascular: Negative.   Gastrointestinal: Negative.   Genitourinary: Negative.   Musculoskeletal: Positive for joint pain.  Skin: Negative.   Neurological: Negative.   Psychiatric/Behavioral: Negative.     There were no vitals taken for this visit. Physical Exam  Constitutional: She is oriented to person, place, and time. She appears well-developed and well-nourished.  HENT:  Head: Normocephalic.  Eyes: Pupils are equal, round, and reactive to light.  Neck: Normal range of motion.  Cardiovascular: Normal rate.  Respiratory: Effort normal.  GI: Soft.  Musculoskeletal:  Patient is a 72 year old female.  Patient is awake, alert, oriented 3. Well-nourished and well-developed. Antalgic gait with no assistive devices.  On examination of the left knee, tender on palpation of the medial joint line. Nontender lateral joint line,  patellar tendon, quadriceps tendon, patella, peroneal nerve and popliteal space. No calf pain or sign of DVT. No pain or laxity with varus or valgus stress. No instability noted. Trace effusion noted. Range of motion approximately is 5-110. Positive patellofemoral crepitus. Positive patellofemoral pain on compression. Sensation intact distally. Good quad strength.  Neurological: She is alert and oriented to person, place, and time.  Skin: Skin is warm and dry.    Prior x-rays reviewed, left knee is bone-on-bone medial and patellofemoral compartments with a varus deformity.  Assessment/Plan L knee primary osteoarthritis  Plan: Pt with end-stage Left knee DJD, bone-on-bone, refractory to conservative tx, scheduled for Left total knee replacement by Dr. Tonita Cong on 03/12/18. We again discussed the procedure itself as well as risks, complications and alternatives, including but not limited to DVT, PE, infx, bleeding, failure of procedure, need for secondary procedure including manipulation, nerve injury, ongoing pain/symptoms, anesthesia risk, even stroke or death. Also discussed typical post-op protocols, activity restrictions, need for PT, flexion/extension exercises, time out of work. Discussed need for DVT ppx post-op per protocol, she did well with ASA previously. Discussed dental ppx and infx prevention. Also discussed limitations post-operatively such as kneeling and squatting. All questions were answered. Patient desires to proceed with surgery as scheduled.  Will hold supplements, ASA and NSAIDs accordingly, has already been holding them. Will remain NPO after midnight the night before surgery. Anticipate hospital stay to include at least 2 midnights given medical history and to ensure proper pain control. Plan ASA 323m BID for DVT ppx post-op. Plan Oxycodone, Robaxin, Colace, Miralax. Plan to D/C to outpt PT with family at home for assistance. Will follow up 10-14 days post-op for suture removal and  xrays.  Plan left total knee replacement  BCecilie Kicks, PA-C for Dr. BTonita Cong5/15/2019, 4:45 PM

## 2018-03-05 NOTE — H&P (View-Only) (Signed)
Alexandra Morrison is an 72 y.o. female.   Chief Complaint: L knee pain HPI: Alexandra Morrison is scheduled for left total knee replacement with Dr. Tonita Cong on May 22. Her back has calmed down since her appointment with [facility]. She is still wearing her left knee hinged brace for support. She had her preop visit at [facility] long today. She had a general anesthetic last time and did well with that and is hoping for the same this time. She did well with oxycodone, Robaxin, Colace, MiraLAX last time with aspirin for DVT prophylaxis. She has no history of DVT.  She reports progressively worsening left knee pain chronic, refractory to conservative treatment including injection therapy, activity modifications, quad strengthening, bracing, medications, relative rest. At this point pain is interfering with her ability to walk, activities of daily living and quality-of-life and she desires to proceed with surgery.  Dr. Tonita Cong and the patient mutually agreed to proceed with a total knee replacement. Risks and benefits of the procedure were discussed including stiffness, suboptimal range of motion, persistent pain, infection requiring removal of prosthesis and reinsertion, need for prophylactic antibiotics in the future, for example, dental procedures, possible need for manipulation, revision in the future and also anesthetic complications including DVT, PE, etc. We discussed the perioperative course, time in the hospital, postoperative recovery and the need for elevation to control swelling. We also discussed the predicted range of motion and the probability that squatting and kneeling would be unobtainable in the future. In addition, postoperative anticoagulation was discussed. We have obtained preoperative medical clearance as necessary. Provided illustrated handout and discussed it in detail. They will enroll in the total joint replacement educational forum at the hospital.  Past Medical History:  Diagnosis Date  . Abdominal  aortic atherosclerosis (HCC)    Moderate  . Anxiety   . Arthritis   . Bipolar disorder ([place])    type 2 hypomanic  . Complication of anesthesia    pt request no spinal anesthesia only general  . DDD (degenerative disc disease), lumbar   . High cholesterol   . History of diabetes mellitus    resolved after weight loss  . History of mumps as a child   . History of sleep apnea    most recent study was negative for sleep apnea, resolved after weight loss   . Hypothyroidism   . Myelopathy ([place])   . Numbness and tingling in hands   . Osteopenia   . Osteoporosis   . Right arm fracture 1953   x2  . Scoliosis    per pt  . Spinal stenosis    Moderate to severe  . Vertigo   . Wears glasses     Past Surgical History:  Procedure Laterality Date  . APPENDECTOMY    . CARPAL TUNNEL RELEASE Bilateral   . [place]   x2  . EYE SURGERY Bilateral 2006   cataracts  . FRACTURE SURGERY     Arm  . GASTRIC BYPASS  2006  . POSTERIOR CERVICAL FUSION/FORAMINOTOMY N/A 11/25/2015   Procedure: Posterior Cervical Fusion with lateral mass fixation Cervical fiv-Thoracic one, cervical laminectomy  - Cervical six-Cervical seven - Cervical seven-Thoracic one;  Surgeon: Alexandra Moore, MD;  Location: [place] [facility]UR[facility] ORS;  Service: Neurosurgery;  Laterality: N/A;  . TOTAL KNEE ARTHROPLASTY Right 07/11/2017   Procedure: RIGHT TOTAL KNEE ARTHROPLASTY;  Surgeon: Susa Day, MD;  Location: [facility];  Service: Orthopedics;  Laterality: Right;  150 mins  . TOTAL SHOULDER ARTHROPLASTY  Bilateral 2010, 2011   Medications: AMPHETAMINE/DEXTROAMPHETAMINE 30 MG TABS aspirin B Complex biotin calcium Cinnamon Co Q-10 Cymbalta Depakote dextroamphetamine-amphetamine 30 mg tablet Fosamax Glucosamine Chondroitin PLUS iron lovastatin magnesium oxide methocarbamol multivitamin Probiotic traMADol traZODone turmeric Tylenol Extra Strength Vitamin B12 Wellbutrin Xanax  No family history on  file. Social History:  reports that she quit smoking about 25 years ago. She has never used smokeless tobacco. She reports that she drinks alcohol. She reports that she does not use drugs. Smoking Status: Former smoker Tobacco-years of use: 3018 Chewing tobacco: none Alcohol intake: Occasional Hand Dominance: Right Work related injury?: N Advance directive: Y Freight forwarder of Attorney: Y  Allergies: No Known Allergies  Results for orders placed or performed during the hospital encounter of 03/05/18 (from the past 48 hour(s))  APTT     Status: None   Collection Time: 03/05/18  1:41 PM  Result Value Ref Range   aPTT 27 24 - 36 seconds    Comment: Performed at [facility], [place] [address], [place], [place] [address]  Basic metabolic panel     Status: Abnormal   Collection Time: 03/05/18  1:41 PM  Result Value Ref Range   Sodium 143 135 - 145 mmol/L   Potassium 3.4 (L) 3.5 - 5.1 mmol/L   Chloride 106 101 - 111 mmol/L   CO2 27 22 - 32 mmol/L   Glucose, Bld 97 65 - 99 mg/dL   BUN 11 6 - 20 mg/dL   Creatinine, Ser 0.81 0.44 - 1.00 mg/dL   Calcium 8.6 (L) 8.9 - 10.3 mg/dL   GFR calc non Af Amer >60 >60 mL/min   GFR calc Af Amer >60 >60 mL/min    Comment: (NOTE) The eGFR has been calculated using the CKD EPI equation. This calculation has not been validated in all clinical situations. eGFR's persistently <60 mL/min signify possible Chronic Kidney Disease.    Anion gap 10 5 - 15    Comment: Performed at [facility], [place] [address], [place], [place] [address]  CBC     Status: Abnormal   Collection Time: 03/05/18  1:41 PM  Result Value Ref Range   WBC 7.8 4.0 - 10.5 K/uL   RBC 3.68 (L) 3.87 - 5.11 MIL/uL   Hemoglobin 11.3 (L) 12.0 - 15.0 g/dL   HCT 35.2 (L) 36.0 - 46.0 %   MCV 95.7 78.0 - 100.0 fL   MCH 30.7 26.0 - 34.0 pg   MCHC 32.1 30.0 - 36.0 g/dL   RDW 13.2 11.5 - 15.5 %   Platelets 241 150 - 400 K/uL    Comment: Performed at [facility], [place] [address] [place], [place] [address]  Protime-INR     Status: None   Collection Time: 03/05/18  1:41 PM  Result Value Ref Range   Prothrombin Time 12.3 11.4 - 15.2 seconds   INR 0.92     Comment: Performed at [facility], [place] [address] [place],  [address]  Urinalysis, Routine w reflex microscopic     Status: Abnormal   Collection Time: 03/05/18  1:41 PM  Result Value Ref Range   Color, Urine COLORLESS (A) YELLOW   APPearance CLEAR CLEAR   Specific Gravity, Urine 1.004 (L) 1.005 - 1.030   pH 5.0 5.0 - 8.0   Glucose, UA NEGATIVE NEGATIVE mg/dL   Hgb urine dipstick NEGATIVE NEGATIVE   Bilirubin Urine NEGATIVE NEGATIVE   Ketones, ur NEGATIVE NEGATIVE mg/dL  Protein, ur NEGATIVE NEGATIVE mg/dL   Nitrite NEGATIVE NEGATIVE   Leukocytes, UA NEGATIVE NEGATIVE    Comment: Performed at [place] [address] [place], [place] [address]   No results found.  Review of Systems  Constitutional: Negative.   HENT: Negative.   Eyes: Negative.   Respiratory: Negative.   Cardiovascular: Negative.   Gastrointestinal: Negative.   Genitourinary: Negative.   Musculoskeletal: Positive for joint pain.  Skin: Negative.   Neurological: Negative.   Psychiatric/Behavioral: Negative.     There were no vitals taken for this visit. Physical Exam  Constitutional: She is oriented to person, place, and time. She appears well-developed and well-nourished.  HENT:  Head: Normocephalic.  Eyes: Pupils are equal, round, and reactive to light.  Neck: Normal range of motion.  Cardiovascular: Normal rate.  Respiratory: Effort normal.  GI: Soft.  Musculoskeletal:  Patient is a 72 year old female.  Patient is awake, alert, oriented 3. Well-nourished and well-developed. Antalgic gait with no assistive devices.  On examination of the left knee, tender on palpation of the medial joint line. Nontender lateral joint line,  patellar tendon, quadriceps tendon, patella, peroneal nerve and popliteal space. No calf pain or sign of DVT. No pain or laxity with varus or valgus stress. No instability noted. Trace effusion noted. Range of motion approximately is 5-110. Positive patellofemoral crepitus. Positive patellofemoral pain on compression. Sensation intact distally. Good quad strength.  Neurological: She is alert and oriented to person, place, and time.  Skin: Skin is warm and dry.    Prior x-rays reviewed, left knee is bone-on-bone medial and patellofemoral compartments with a varus deformity.  Assessment/Plan L knee primary osteoarthritis  Plan: Pt with end-stage Left knee DJD, bone-on-bone, refractory to conservative tx, scheduled for Left total knee replacement by Dr. Tonita Cong on 03/12/18. We again discussed the procedure itself as well as risks, complications and alternatives, including but not limited to DVT, PE, infx, bleeding, failure of procedure, need for secondary procedure including manipulation, nerve injury, ongoing pain/symptoms, anesthesia risk, even stroke or death. Also discussed typical post-op protocols, activity restrictions, need for PT, flexion/extension exercises, time out of work. Discussed need for DVT ppx post-op per protocol, she did well with ASA previously. Discussed dental ppx and infx prevention. Also discussed limitations post-operatively such as kneeling and squatting. All questions were answered. Patient desires to proceed with surgery as scheduled.  Will hold supplements, ASA and NSAIDs accordingly, has already been holding them. Will remain NPO after midnight the night before surgery. Anticipate hospital stay to include at least 2 midnights given medical history and to ensure proper pain control. Plan ASA 323m BID for DVT ppx post-op. Plan Oxycodone, Robaxin, Colace, Miralax. Plan to D/C to outpt PT with family at home for assistance. Will follow up 10-14 days post-op for suture removal and  xrays.  Plan left total knee replacement  BCecilie Kicks, PA-C for Dr. BTonita Cong5/15/2019, 4:45 PM

## 2018-03-05 NOTE — Pre-Procedure Instructions (Signed)
CBC, BMP, UA results 03/05/2018 faxed to Dr. Tonita Cong via epic.

## 2018-03-10 ENCOUNTER — Ambulatory Visit: Payer: Self-pay | Admitting: Specialist

## 2018-03-10 NOTE — H&P (Signed)
Alexandra Morrison is an 72 y.o. female.   Chief Complaint: L knee pain HPI: Alexandra Morrison is scheduled for left total knee replacement with Dr. Tonita Cong on May 22. Her back has calmed down since her appointment with [facility]. She is still wearing her left knee hinged brace for support. She had her preop visit at [facility] long today. She had a general anesthetic last time and did well with that and is hoping for the same this time. She did well with oxycodone, Robaxin, Colace, MiraLAX last time with aspirin for DVT prophylaxis. She has no history of DVT.  She reports progressively worsening left knee pain chronic, refractory to conservative treatment including injection therapy, activity modifications, quad strengthening, bracing, medications, relative rest. At this point pain is interfering with her ability to walk, activities of daily living and quality-of-life and she desires to proceed with surgery.  Dr. Tonita Cong and the patient mutually agreed to proceed with a total knee replacement. Risks and benefits of the procedure were discussed including stiffness, suboptimal range of motion, persistent pain, infection requiring removal of prosthesis and reinsertion, need for prophylactic antibiotics in the future, for example, dental procedures, possible need for manipulation, revision in the future and also anesthetic complications including DVT, PE, etc. We discussed the perioperative course, time in the hospital, postoperative recovery and the need for elevation to control swelling. We also discussed the predicted range of motion and the probability that squatting and kneeling would be unobtainable in the future. In addition, postoperative anticoagulation was discussed. We have obtained preoperative medical clearance as necessary. Provided illustrated handout and discussed it in detail. They will enroll in the total joint replacement educational forum at the hospital.      Past Medical History:  Diagnosis Date  .  Abdominal aortic atherosclerosis (HCC)    Moderate  . Anxiety   . Arthritis   . Bipolar disorder ([place])    type 2 hypomanic  . Complication of anesthesia    pt request no spinal anesthesia only general  . DDD (degenerative disc disease), lumbar   . High cholesterol   . History of diabetes mellitus    resolved after weight loss  . History of mumps as a child   . History of sleep apnea    most recent study was negative for sleep apnea, resolved after weight loss   . Hypothyroidism   . Myelopathy ([place])   . Numbness and tingling in hands   . Osteopenia   . Osteoporosis   . Right arm fracture 1953   x2  . Scoliosis    per pt  . Spinal stenosis    Moderate to severe  . Vertigo   . Wears glasses          Past Surgical History:  Procedure Laterality Date  . APPENDECTOMY    . CARPAL TUNNEL RELEASE Bilateral   . [place]   x2  . EYE SURGERY Bilateral 2006   cataracts  . FRACTURE SURGERY     Arm  . GASTRIC BYPASS  2006  . POSTERIOR CERVICAL FUSION/FORAMINOTOMY N/A 11/25/2015   Procedure: Posterior Cervical Fusion with lateral mass fixation Cervical fiv-Thoracic one, cervical laminectomy  - Cervical six-Cervical seven - Cervical seven-Thoracic one;  Surgeon: Eustace Moore, MD;  Location: [place] NEURO ORS;  Service: Neurosurgery;  Laterality: N/A;  . TOTAL KNEE ARTHROPLASTY Right 07/11/2017   Procedure: RIGHT TOTAL KNEE ARTHROPLASTY;  Surgeon: Susa Day, MD;  Location: [facility];  Service: Orthopedics;  Laterality:  Right;  150 mins  . TOTAL SHOULDER ARTHROPLASTY Bilateral 2010, 2011   Medications: AMPHETAMINE/DEXTROAMPHETAMINE 30 MG TABS aspirin B Complex biotin calcium Cinnamon Co Q-10 Cymbalta Depakote dextroamphetamine-amphetamine 30 mg tablet Fosamax Glucosamine Chondroitin PLUS iron lovastatin magnesium oxide methocarbamol multivitamin Probiotic traMADol traZODone turmeric Tylenol Extra  Strength Vitamin B12 Wellbutrin Xanax  No family history on file. Social History:  reports that she quit smoking about 25 years ago. She has never used smokeless tobacco. She reports that she drinks alcohol. She reports that she does not use drugs. Smoking Status: Former smoker Tobacco-years of use: 3018 Chewing tobacco: none Alcohol intake: Occasional Hand Dominance: Right Work related injury?: N Advance directive: Y Freight forwarder of Attorney: Y  Allergies: No Known Allergies  LabResultsLast48Hours        Results for orders placed or performed during the hospital encounter of 03/05/18 (from the past 48 hour(s))  APTT     Status: None   Collection Time: 03/05/18  1:41 PM  Result Value Ref Range   aPTT 27 24 - 36 seconds    Comment: Performed at [facility], [place] [address] [place], [place] [address]  Basic metabolic panel     Status: Abnormal   Collection Time: 03/05/18  1:41 PM  Result Value Ref Range   Sodium 143 135 - 145 mmol/L   Potassium 3.4 (L) 3.5 - 5.1 mmol/L   Chloride 106 101 - 111 mmol/L   CO2 27 22 - 32 mmol/L   Glucose, Bld 97 65 - 99 mg/dL   BUN 11 6 - 20 mg/dL   Creatinine, Ser 0.81 0.44 - 1.00 mg/dL   Calcium 8.6 (L) 8.9 - 10.3 mg/dL   GFR calc non Af Amer >60 >60 mL/min   GFR calc Af Amer >60 >60 mL/min    Comment: (NOTE) The eGFR has been calculated using the CKD EPI equation. This calculation has not been validated in all clinical situations. eGFR's persistently <60 mL/min signify possible Chronic Kidney Disease.    Anion gap 10 5 - 15    Comment: Performed at [facility], [place] [address], [place], [place] [address]  CBC     Status: Abnormal   Collection Time: 03/05/18  1:41 PM  Result Value Ref Range   WBC 7.8 4.0 - 10.5 K/uL   RBC 3.68 (L) 3.87 - 5.11 MIL/uL   Hemoglobin 11.3 (L) 12.0 - 15.0 g/dL   HCT 35.2 (L) 36.0 - 46.0 %   MCV 95.7 78.0 - 100.0 fL   MCH 30.7 26.0  - 34.0 pg   MCHC 32.1 30.0 - 36.0 g/dL   RDW 13.2 11.5 - 15.5 %   Platelets 241 150 - 400 K/uL    Comment: Performed at [facility], [place] [address] [place], [place] [address]  Protime-INR     Status: None   Collection Time: 03/05/18  1:41 PM  Result Value Ref Range   Prothrombin Time 12.3 11.4 - 15.2 seconds   INR 0.92     Comment: Performed at [facility], [place] [address] [place], [place] [address]  Urinalysis, Routine w reflex microscopic     Status: Abnormal   Collection Time: 03/05/18  1:41 PM  Result Value Ref Range   Color, Urine COLORLESS (A) YELLOW   APPearance CLEAR CLEAR   Specific Gravity, Urine 1.004 (L) 1.005 - 1.030   pH 5.0 5.0 - 8.0   Glucose, UA NEGATIVE NEGATIVE mg/dL   Hgb urine  dipstick NEGATIVE NEGATIVE   Bilirubin Urine NEGATIVE NEGATIVE   Ketones, ur NEGATIVE NEGATIVE mg/dL   Protein, ur NEGATIVE NEGATIVE mg/dL   Nitrite NEGATIVE NEGATIVE   Leukocytes, UA NEGATIVE NEGATIVE    Comment: Performed at [facility], [place] [address] [place], [place] [address]     ImagingResults(Last48hours)  No results found.    Review of Systems  Constitutional: Negative.   HENT: Negative.   Eyes: Negative.   Respiratory: Negative.   Cardiovascular: Negative.   Gastrointestinal: Negative.   Genitourinary: Negative.   Musculoskeletal: Positive for joint pain.  Skin: Negative.   Neurological: Negative.   Psychiatric/Behavioral: Negative.     There were no vitals taken for this visit. Physical Exam  Constitutional: She is oriented to person, place, and time. She appears well-developed and well-nourished.  HENT:  Head: Normocephalic.  Eyes: Pupils are equal, round, and reactive to light.  Neck: Normal range of motion.  Cardiovascular: Normal rate.  Respiratory: Effort normal.  GI: Soft.  Musculoskeletal:  Patient is a 72 year old female.  Patient is awake, alert,  oriented 3. Well-nourished and well-developed. Antalgic gait with no assistive devices.  On examination of the left knee, tender on palpation of the medial joint line. Nontender lateral joint line, patellar tendon, quadriceps tendon, patella, peroneal nerve and popliteal space. No calf pain or sign of DVT. No pain or laxity with varus or valgus stress. No instability noted. Trace effusion noted. Range of motion approximately is 5-110. Positive patellofemoral crepitus. Positive patellofemoral pain on compression. Sensation intact distally. Good quad strength.  Neurological: She is alert and oriented to person, place, and time.  Skin: Skin is warm and dry.    Prior x-rays reviewed, left knee is bone-on-bone medial and patellofemoral compartments with a varus deformity.  Assessment/Plan L knee primary osteoarthritis  Plan: Pt with end-stage Left knee DJD, bone-on-bone, refractory to conservative tx, scheduled for Left total knee replacement by Dr. Tonita Cong on 03/12/18. We again discussed the procedure itself as well as risks, complications and alternatives, including but not limited to DVT, PE, infx, bleeding, failure of procedure, need for secondary procedure including manipulation, nerve injury, ongoing pain/symptoms, anesthesia risk, even stroke or death. Also discussed typical post-op protocols, activity restrictions, need for PT, flexion/extension exercises, time out of work. Discussed need for DVT ppx post-op per protocol, she did well with ASA previously. Discussed dental ppx and infx prevention. Also discussed limitations post-operatively such as kneeling and squatting. All questions were answered. Patient desires to proceed with surgery as scheduled.  Will hold supplements, ASA and NSAIDs accordingly, has already been holding them. Will remain NPO after midnight the night before surgery. Anticipate hospital stay to include at least 2 midnights given medical history and to ensure proper pain  control. Plan ASA 358m BID for DVT ppx post-op. Plan Oxycodone, Robaxin, Colace, Miralax. Plan to D/C to outpt PT with family at home for assistance. Will follow up 10-14 days post-op for suture removal and xrays.  Anticipated LOS equal to or greater than 2 midnights due to - Age [age]and older with one or more of the following:  - Obesity  - Expected need for hospital services (PT, OT, Nursing) required for safe  discharge  - Anticipated need for postoperative skilled nursing care or inpatient rehab  - Active co-morbidities: None   Plan left total knee replacement  BCecilie Kicks, PA-C for Dr. BTonita Cong5/15/2019, 4:45 PM

## 2018-03-11 ENCOUNTER — Other Ambulatory Visit: Payer: Self-pay | Admitting: Specialist

## 2018-03-11 MED ORDER — SODIUM CHLORIDE 0.9 % IV SOLN
1000.0000 mg | INTRAVENOUS | Status: AC
  Administered 2018-03-12: 1000 mg via INTRAVENOUS
  Filled 2018-03-11: qty 1100

## 2018-03-11 NOTE — Care Plan (Signed)
Ortho Bundle L TKA scheduled 03-12-18 DCP:  Home with spouse.  1 story home with 1 ste.   DME:  No needs.  Has equipment from previous R TKA PT:  [facility] PT.  Eval scheduled on 03-18-18.

## 2018-03-12 ENCOUNTER — Other Ambulatory Visit: Payer: Self-pay

## 2018-03-12 ENCOUNTER — Inpatient Hospital Stay (HOSPITAL_COMMUNITY): Payer: Medicare Other | Admitting: Anesthesiology

## 2018-03-12 ENCOUNTER — Encounter (HOSPITAL_COMMUNITY): Payer: Self-pay

## 2018-03-12 ENCOUNTER — Encounter (HOSPITAL_COMMUNITY)
Admission: RE | Disposition: A | Discharge status: Home / Self Care | Payer: Self-pay | Source: Ambulatory Visit | Attending: Specialist

## 2018-03-12 ENCOUNTER — Inpatient Hospital Stay (HOSPITAL_COMMUNITY)
Admission: RE | Admit: 2018-03-12 | Discharge: 2018-03-14 | DRG: 470 | Disposition: A | Discharge status: Home / Self Care | Payer: Medicare Other | Source: Ambulatory Visit | Attending: Specialist | Admitting: Specialist

## 2018-03-12 ENCOUNTER — Inpatient Hospital Stay (HOSPITAL_COMMUNITY): Payer: Medicare Other

## 2018-03-12 DIAGNOSIS — G959 Disease of spinal cord, unspecified: Secondary | ICD-10-CM | POA: Diagnosis present

## 2018-03-12 DIAGNOSIS — M419 Scoliosis, unspecified: Secondary | ICD-10-CM | POA: Diagnosis not present

## 2018-03-12 DIAGNOSIS — F3181 Bipolar II disorder: Secondary | ICD-10-CM | POA: Diagnosis not present

## 2018-03-12 DIAGNOSIS — F419 Anxiety disorder, unspecified: Secondary | ICD-10-CM | POA: Diagnosis present

## 2018-03-12 DIAGNOSIS — Z87891 Personal history of nicotine dependence: Secondary | ICD-10-CM | POA: Diagnosis not present

## 2018-03-12 DIAGNOSIS — E7800 Pure hypercholesterolemia, unspecified: Secondary | ICD-10-CM | POA: Diagnosis not present

## 2018-03-12 DIAGNOSIS — Z96659 Presence of unspecified artificial knee joint: Secondary | ICD-10-CM

## 2018-03-12 DIAGNOSIS — Z981 Arthrodesis status: Secondary | ICD-10-CM

## 2018-03-12 DIAGNOSIS — G8918 Other acute postprocedural pain: Secondary | ICD-10-CM | POA: Diagnosis not present

## 2018-03-12 DIAGNOSIS — Z8639 Personal history of other endocrine, nutritional and metabolic disease: Secondary | ICD-10-CM

## 2018-03-12 DIAGNOSIS — Z8709 Personal history of other diseases of the respiratory system: Secondary | ICD-10-CM

## 2018-03-12 DIAGNOSIS — M4800 Spinal stenosis, site unspecified: Secondary | ICD-10-CM | POA: Diagnosis present

## 2018-03-12 DIAGNOSIS — Z96612 Presence of left artificial shoulder joint: Secondary | ICD-10-CM | POA: Diagnosis present

## 2018-03-12 DIAGNOSIS — Z7982 Long term (current) use of aspirin: Secondary | ICD-10-CM

## 2018-03-12 DIAGNOSIS — M1712 Unilateral primary osteoarthritis, left knee: Principal | ICD-10-CM | POA: Diagnosis present

## 2018-03-12 DIAGNOSIS — Z79899 Other long term (current) drug therapy: Secondary | ICD-10-CM

## 2018-03-12 DIAGNOSIS — Z9884 Bariatric surgery status: Secondary | ICD-10-CM

## 2018-03-12 DIAGNOSIS — Z96611 Presence of right artificial shoulder joint: Secondary | ICD-10-CM | POA: Diagnosis present

## 2018-03-12 DIAGNOSIS — E039 Hypothyroidism, unspecified: Secondary | ICD-10-CM | POA: Diagnosis present

## 2018-03-12 DIAGNOSIS — Z79891 Long term (current) use of opiate analgesic: Secondary | ICD-10-CM | POA: Diagnosis not present

## 2018-03-12 DIAGNOSIS — M810 Age-related osteoporosis without current pathological fracture: Secondary | ICD-10-CM | POA: Diagnosis present

## 2018-03-12 DIAGNOSIS — M5136 Other intervertebral disc degeneration, lumbar region: Secondary | ICD-10-CM | POA: Diagnosis present

## 2018-03-12 DIAGNOSIS — I700 Atherosclerosis of aorta: Secondary | ICD-10-CM | POA: Diagnosis present

## 2018-03-12 DIAGNOSIS — Z471 Aftercare following joint replacement surgery: Secondary | ICD-10-CM | POA: Diagnosis not present

## 2018-03-12 DIAGNOSIS — Z96651 Presence of right artificial knee joint: Secondary | ICD-10-CM | POA: Diagnosis not present

## 2018-03-12 DIAGNOSIS — M21162 Varus deformity, not elsewhere classified, left knee: Secondary | ICD-10-CM | POA: Diagnosis present

## 2018-03-12 DIAGNOSIS — I739 Peripheral vascular disease, unspecified: Secondary | ICD-10-CM | POA: Diagnosis present

## 2018-03-12 DIAGNOSIS — Z9189 Other specified personal risk factors, not elsewhere classified: Secondary | ICD-10-CM | POA: Diagnosis not present

## 2018-03-12 DIAGNOSIS — M8580 Other specified disorders of bone density and structure, unspecified site: Secondary | ICD-10-CM | POA: Diagnosis present

## 2018-03-12 DIAGNOSIS — Z7983 Long term (current) use of bisphosphonates: Secondary | ICD-10-CM

## 2018-03-12 HISTORY — PX: TOTAL KNEE ARTHROPLASTY: SHX125

## 2018-03-12 SURGERY — ARTHROPLASTY, KNEE, TOTAL
Anesthesia: Spinal | Site: Knee | Laterality: Left

## 2018-03-12 MED ORDER — VITAMIN B-12 1000 MCG PO TABS
1000.0000 ug | ORAL_TABLET | ORAL | Status: DC
  Filled 2018-03-12: qty 1

## 2018-03-12 MED ORDER — ACETAMINOPHEN 500 MG PO TABS: 500.0000 mg | ORAL_TABLET | Freq: Four times a day (QID) | ORAL | Status: DC | PRN

## 2018-03-12 MED ORDER — METOCLOPRAMIDE HCL 5 MG PO TABS: 5.0000 mg | ORAL_TABLET | Freq: Three times a day (TID) | ORAL | Status: DC | PRN

## 2018-03-12 MED ORDER — BUPIVACAINE-EPINEPHRINE (PF) 0.25% -1:200000 IJ SOLN
INTRAMUSCULAR | Status: AC
  Filled 2018-03-12: qty 30

## 2018-03-12 MED ORDER — MORPHINE SULFATE (PF) 2 MG/ML IV SOLN
0.5000 mg | INTRAVENOUS | Status: DC | PRN
  Administered 2018-03-13 (×2): 1 mg via INTRAVENOUS
  Filled 2018-03-12 (×2): qty 1

## 2018-03-12 MED ORDER — CEFAZOLIN SODIUM-DEXTROSE 2-4 GM/100ML-% IV SOLN
2.0000 g | INTRAVENOUS | Status: AC
  Administered 2018-03-12: 2 g via INTRAVENOUS
  Filled 2018-03-12: qty 100

## 2018-03-12 MED ORDER — LEVOTHYROXINE SODIUM 50 MCG PO TABS
50.0000 ug | ORAL_TABLET | Freq: Every day | ORAL | Status: DC
Start: 2018-03-13 — End: 2018-03-14
  Administered 2018-03-13 – 2018-03-14 (×2): 50 ug via ORAL
  Filled 2018-03-12 (×2): qty 1

## 2018-03-12 MED ORDER — HYDROCODONE-ACETAMINOPHEN 7.5-325 MG PO TABS
1.0000 | ORAL_TABLET | ORAL | Status: DC | PRN
  Administered 2018-03-12: 1 via ORAL
  Administered 2018-03-13: 2 via ORAL
  Administered 2018-03-13: 1 via ORAL
  Administered 2018-03-13 – 2018-03-14 (×4): 2 via ORAL
  Filled 2018-03-12 (×3): qty 2
  Filled 2018-03-12: qty 1
  Filled 2018-03-12 (×4): qty 2

## 2018-03-12 MED ORDER — MAGNESIUM OXIDE 400 (241.3 MG) MG PO TABS
400.0000 mg | ORAL_TABLET | Freq: Every day | ORAL | Status: DC
  Administered 2018-03-14: 400 mg via ORAL
  Filled 2018-03-12 (×2): qty 1

## 2018-03-12 MED ORDER — BUPIVACAINE-EPINEPHRINE 0.25% -1:200000 IJ SOLN
INTRAMUSCULAR | Status: DC | PRN
  Administered 2018-03-12: 30 mL

## 2018-03-12 MED ORDER — ALUM & MAG HYDROXIDE-SIMETH 200-200-20 MG/5ML PO SUSP
30.0000 mL | ORAL | Status: DC | PRN
Start: 2018-03-12 — End: 2018-03-14
  Administered 2018-03-13: 30 mL via ORAL
  Filled 2018-03-12: qty 30

## 2018-03-12 MED ORDER — DIPHENHYDRAMINE HCL 12.5 MG/5ML PO ELIX: 12.5000 mg | ORAL_SOLUTION | ORAL | Status: DC | PRN

## 2018-03-12 MED ORDER — BISACODYL 5 MG PO TBEC: 5.0000 mg | DELAYED_RELEASE_TABLET | Freq: Every day | ORAL | Status: DC | PRN

## 2018-03-12 MED ORDER — RISAQUAD PO CAPS
1.0000 | ORAL_CAPSULE | Freq: Every day | ORAL | Status: DC
  Administered 2018-03-14: 1 via ORAL
  Filled 2018-03-12 (×2): qty 1

## 2018-03-12 MED ORDER — METOCLOPRAMIDE HCL 5 MG/ML IJ SOLN: 5.0000 mg | Freq: Three times a day (TID) | INTRAMUSCULAR | Status: DC | PRN

## 2018-03-12 MED ORDER — ROCURONIUM BROMIDE 10 MG/ML (PF) SYRINGE
PREFILLED_SYRINGE | INTRAVENOUS | Status: AC
  Filled 2018-03-12: qty 5

## 2018-03-12 MED ORDER — DEXAMETHASONE SODIUM PHOSPHATE 10 MG/ML IJ SOLN
INTRAMUSCULAR | Status: AC
  Filled 2018-03-12: qty 1

## 2018-03-12 MED ORDER — LIDOCAINE 2% (20 MG/ML) 5 ML SYRINGE
INTRAMUSCULAR | Status: AC
  Filled 2018-03-12: qty 5

## 2018-03-12 MED ORDER — METHOCARBAMOL 500 MG PO TABS
500.0000 mg | ORAL_TABLET | Freq: Four times a day (QID) | ORAL | Status: DC | PRN
  Administered 2018-03-13 – 2018-03-14 (×2): 500 mg via ORAL
  Filled 2018-03-12 (×2): qty 1

## 2018-03-12 MED ORDER — PHENOL 1.4 % MT LIQD: 1.0000 | OROMUCOSAL | Status: DC | PRN

## 2018-03-12 MED ORDER — MIDAZOLAM HCL 5 MG/5ML IJ SOLN
INTRAMUSCULAR | Status: DC | PRN
  Administered 2018-03-12 (×2): 1 mg via INTRAVENOUS

## 2018-03-12 MED ORDER — BUPROPION HCL ER (XL) 300 MG PO TB24
300.0000 mg | ORAL_TABLET | ORAL | Status: DC
  Administered 2018-03-13 – 2018-03-14 (×2): 300 mg via ORAL
  Filled 2018-03-12 (×2): qty 1

## 2018-03-12 MED ORDER — ACETAMINOPHEN 500 MG PO TABS
500.0000 mg | ORAL_TABLET | Freq: Four times a day (QID) | ORAL | Status: AC
  Administered 2018-03-12 (×2): 500 mg via ORAL
  Filled 2018-03-12 (×4): qty 1

## 2018-03-12 MED ORDER — HYDROMORPHONE HCL 1 MG/ML IJ SOLN
0.2500 mg | INTRAMUSCULAR | Status: DC | PRN
  Administered 2018-03-12 (×4): 0.5 mg via INTRAVENOUS

## 2018-03-12 MED ORDER — HYDROCODONE-ACETAMINOPHEN 5-325 MG PO TABS
1.0000 | ORAL_TABLET | ORAL | Status: DC | PRN
  Administered 2018-03-12 – 2018-03-14 (×4): 2 via ORAL
  Filled 2018-03-12 (×4): qty 2

## 2018-03-12 MED ORDER — SODIUM CHLORIDE 0.9 % IV SOLN
INTRAVENOUS | Status: AC
  Filled 2018-03-12: qty 500000

## 2018-03-12 MED ORDER — ASPIRIN 81 MG PO CHEW
81.0000 mg | CHEWABLE_TABLET | Freq: Two times a day (BID) | ORAL | Status: DC
  Administered 2018-03-13 – 2018-03-14 (×3): 81 mg via ORAL
  Filled 2018-03-12 (×3): qty 1

## 2018-03-12 MED ORDER — DOCUSATE SODIUM 100 MG PO CAPS
100.0000 mg | ORAL_CAPSULE | Freq: Two times a day (BID) | ORAL | Status: DC
  Administered 2018-03-12 – 2018-03-14 (×4): 100 mg via ORAL
  Filled 2018-03-12 (×4): qty 1

## 2018-03-12 MED ORDER — FENTANYL CITRATE (PF) 250 MCG/5ML IJ SOLN
INTRAMUSCULAR | Status: AC
  Filled 2018-03-12: qty 5

## 2018-03-12 MED ORDER — NIACIN 500 MG PO TABS
500.0000 mg | ORAL_TABLET | Freq: Every day | ORAL | Status: DC
  Administered 2018-03-14: 500 mg via ORAL
  Filled 2018-03-12 (×3): qty 1

## 2018-03-12 MED ORDER — DULOXETINE HCL 30 MG PO CPEP
30.0000 mg | ORAL_CAPSULE | Freq: Every evening | ORAL | Status: DC
  Administered 2018-03-12 – 2018-03-13 (×2): 30 mg via ORAL
  Filled 2018-03-12 (×2): qty 1

## 2018-03-12 MED ORDER — MEPERIDINE HCL 50 MG/ML IJ SOLN: 6.2500 mg | INTRAMUSCULAR | Status: DC | PRN

## 2018-03-12 MED ORDER — LACTATED RINGERS IV SOLN
INTRAVENOUS | Status: DC
  Administered 2018-03-12 – 2018-03-13 (×3): via INTRAVENOUS

## 2018-03-12 MED ORDER — SUGAMMADEX SODIUM 200 MG/2ML IV SOLN
INTRAVENOUS | Status: DC | PRN
  Administered 2018-03-12: 120 mg via INTRAVENOUS

## 2018-03-12 MED ORDER — CEFAZOLIN SODIUM-DEXTROSE 2-4 GM/100ML-% IV SOLN
2.0000 g | Freq: Four times a day (QID) | INTRAVENOUS | Status: AC
  Administered 2018-03-12 (×2): 2 g via INTRAVENOUS
  Filled 2018-03-12 (×2): qty 100

## 2018-03-12 MED ORDER — MAGNESIUM CITRATE PO SOLN: 1.0000 | Freq: Once | ORAL | Status: DC | PRN

## 2018-03-12 MED ORDER — ROPIVACAINE HCL 7.5 MG/ML IJ SOLN
INTRAMUSCULAR | Status: DC | PRN
  Administered 2018-03-12: 30 mL via PERINEURAL

## 2018-03-12 MED ORDER — LIDOCAINE 2% (20 MG/ML) 5 ML SYRINGE
INTRAMUSCULAR | Status: DC | PRN
  Administered 2018-03-12: 60 mg via INTRAVENOUS

## 2018-03-12 MED ORDER — DIVALPROEX SODIUM ER 500 MG PO TB24
500.0000 mg | ORAL_TABLET | Freq: Every day | ORAL | Status: DC
  Administered 2018-03-13 – 2018-03-14 (×2): 500 mg via ORAL
  Filled 2018-03-12 (×2): qty 1

## 2018-03-12 MED ORDER — ADULT MULTIVITAMIN W/MINERALS CH
1.0000 | ORAL_TABLET | Freq: Every day | ORAL | Status: DC
  Administered 2018-03-14: 1 via ORAL
  Filled 2018-03-12 (×2): qty 1

## 2018-03-12 MED ORDER — ONDANSETRON HCL 4 MG PO TABS: 4.0000 mg | ORAL_TABLET | Freq: Four times a day (QID) | ORAL | Status: DC | PRN

## 2018-03-12 MED ORDER — SODIUM CHLORIDE 0.9 % IR SOLN
Status: DC | PRN
  Administered 2018-03-12: 1000 mL

## 2018-03-12 MED ORDER — AMPHETAMINE-DEXTROAMPHETAMINE 10 MG PO TABS
30.0000 mg | ORAL_TABLET | Freq: Two times a day (BID) | ORAL | Status: DC
  Filled 2018-03-12 (×2): qty 3

## 2018-03-12 MED ORDER — ALPRAZOLAM 0.5 MG PO TABS
0.5000 mg | ORAL_TABLET | Freq: Two times a day (BID) | ORAL | Status: DC | PRN
  Administered 2018-03-13 – 2018-03-14 (×3): 0.5 mg via ORAL
  Filled 2018-03-12 (×3): qty 1

## 2018-03-12 MED ORDER — MENTHOL 3 MG MT LOZG: 1.0000 | LOZENGE | OROMUCOSAL | Status: DC | PRN

## 2018-03-12 MED ORDER — PROPOFOL 10 MG/ML IV BOLUS
INTRAVENOUS | Status: DC | PRN
  Administered 2018-03-12: 100 mg via INTRAVENOUS

## 2018-03-12 MED ORDER — SUGAMMADEX SODIUM 200 MG/2ML IV SOLN
INTRAVENOUS | Status: AC
  Filled 2018-03-12: qty 2

## 2018-03-12 MED ORDER — ACETAMINOPHEN 325 MG PO TABS: 325.0000 mg | ORAL_TABLET | Freq: Four times a day (QID) | ORAL | Status: DC | PRN

## 2018-03-12 MED ORDER — METHOCARBAMOL 1000 MG/10ML IJ SOLN
500.0000 mg | Freq: Four times a day (QID) | INTRAVENOUS | Status: DC | PRN
  Administered 2018-03-12 – 2018-03-13 (×2): 500 mg via INTRAVENOUS
  Filled 2018-03-12 (×2): qty 550

## 2018-03-12 MED ORDER — FUROSEMIDE 20 MG PO TABS: 20.0000 mg | ORAL_TABLET | Freq: Two times a day (BID) | ORAL | Status: DC | PRN

## 2018-03-12 MED ORDER — STERILE WATER FOR IRRIGATION IR SOLN
Status: DC | PRN
  Administered 2018-03-12: 2000 mL

## 2018-03-12 MED ORDER — ONDANSETRON HCL 4 MG/2ML IJ SOLN
INTRAMUSCULAR | Status: AC
  Filled 2018-03-12: qty 2

## 2018-03-12 MED ORDER — PROMETHAZINE HCL 25 MG/ML IJ SOLN: 6.2500 mg | INTRAMUSCULAR | Status: DC | PRN

## 2018-03-12 MED ORDER — DEXAMETHASONE SODIUM PHOSPHATE 10 MG/ML IJ SOLN
INTRAMUSCULAR | Status: DC | PRN
  Administered 2018-03-12: 10 mg via INTRAVENOUS

## 2018-03-12 MED ORDER — KCL IN DEXTROSE-NACL 20-5-0.45 MEQ/L-%-% IV SOLN
INTRAVENOUS | Status: AC
  Administered 2018-03-12: 15:00:00 via INTRAVENOUS
  Filled 2018-03-12: qty 1000

## 2018-03-12 MED ORDER — HYDROMORPHONE HCL 1 MG/ML IJ SOLN
INTRAMUSCULAR | Status: AC
  Administered 2018-03-12: 0.5 mg via INTRAVENOUS
  Filled 2018-03-12: qty 1

## 2018-03-12 MED ORDER — MIDAZOLAM HCL 2 MG/2ML IJ SOLN
INTRAMUSCULAR | Status: AC
  Filled 2018-03-12: qty 2

## 2018-03-12 MED ORDER — ONDANSETRON HCL 4 MG/2ML IJ SOLN
INTRAMUSCULAR | Status: DC | PRN
  Administered 2018-03-12: 4 mg via INTRAVENOUS

## 2018-03-12 MED ORDER — HYDROMORPHONE HCL 1 MG/ML IJ SOLN
INTRAMUSCULAR | Status: AC
  Filled 2018-03-12: qty 1

## 2018-03-12 MED ORDER — PROPOFOL 10 MG/ML IV BOLUS
INTRAVENOUS | Status: AC
  Filled 2018-03-12: qty 20

## 2018-03-12 MED ORDER — SODIUM CHLORIDE 0.9 % IV SOLN
INTRAVENOUS | Status: DC | PRN
  Administered 2018-03-12: 500 mL

## 2018-03-12 MED ORDER — ACETAMINOPHEN 10 MG/ML IV SOLN
INTRAVENOUS | Status: AC
  Filled 2018-03-12: qty 100

## 2018-03-12 MED ORDER — ROCURONIUM BROMIDE 50 MG/5ML IV SOSY
PREFILLED_SYRINGE | INTRAVENOUS | Status: DC | PRN
  Administered 2018-03-12: 50 mg via INTRAVENOUS

## 2018-03-12 MED ORDER — CLONIDINE HCL (ANALGESIA) 100 MCG/ML EP SOLN
EPIDURAL | Status: DC | PRN
  Administered 2018-03-12 (×2): 50 ug

## 2018-03-12 MED ORDER — ONDANSETRON HCL 4 MG/2ML IJ SOLN: 4.0000 mg | Freq: Four times a day (QID) | INTRAMUSCULAR | Status: DC | PRN

## 2018-03-12 MED ORDER — POLYETHYLENE GLYCOL 3350 17 G PO PACK: 17.0000 g | PACK | Freq: Every day | ORAL | Status: DC | PRN

## 2018-03-12 MED ORDER — FENTANYL CITRATE (PF) 100 MCG/2ML IJ SOLN
INTRAMUSCULAR | Status: DC | PRN
  Administered 2018-03-12 (×5): 50 ug via INTRAVENOUS

## 2018-03-12 MED ORDER — PHENYLEPHRINE 40 MCG/ML (10ML) SYRINGE FOR IV PUSH (FOR BLOOD PRESSURE SUPPORT)
PREFILLED_SYRINGE | INTRAVENOUS | Status: DC | PRN
  Administered 2018-03-12 (×2): 80 ug via INTRAVENOUS
  Administered 2018-03-12 (×2): 40 ug via INTRAVENOUS
  Administered 2018-03-12: 80 ug via INTRAVENOUS

## 2018-03-12 SURGICAL SUPPLY — 74 items
BAG ZIPLOCK 12X15 (MISCELLANEOUS) IMPLANT
BANDAGE ACE 4X5 VEL STRL LF (GAUZE/BANDAGES/DRESSINGS) ×3 IMPLANT
BANDAGE ACE 6X5 VEL STRL LF (GAUZE/BANDAGES/DRESSINGS) ×3 IMPLANT
BLADE SAG 18X100X1.27 (BLADE) ×3 IMPLANT
BLADE SAW SGTL 11.0X1.19X90.0M (BLADE) ×3 IMPLANT
BLADE SAW SGTL 13.0X1.19X90.0M (BLADE) ×3 IMPLANT
CAPT KNEE TOTAL 3 ATTUNE ×3 IMPLANT
CEMENT HV SMART SET (Cement) ×6 IMPLANT
CLOSURE WOUND 1/2 X4 (GAUZE/BANDAGES/DRESSINGS) ×1
CLOTH 2% CHLOROHEXIDINE 3PK (PERSONAL CARE ITEMS) ×3 IMPLANT
COVER SURGICAL LIGHT HANDLE (MISCELLANEOUS) ×3 IMPLANT
CUFF TOURN SGL QUICK 34 (TOURNIQUET CUFF) ×2
CUFF TRNQT CYL 34X4X40X1 (TOURNIQUET CUFF) ×1 IMPLANT
DECANTER SPIKE VIAL GLASS SM (MISCELLANEOUS) ×3 IMPLANT
DRAPE LG THREE QUARTER DISP (DRAPES) ×3 IMPLANT
DRAPE ORTHO SPLIT 77X108 STRL (DRAPES) ×4
DRAPE SHEET LG 3/4 BI-LAMINATE (DRAPES) ×6 IMPLANT
DRAPE SURG ORHT 6 SPLT 77X108 (DRAPES) ×2 IMPLANT
DRAPE U-SHAPE 47X51 STRL (DRAPES) ×3 IMPLANT
DRESSING AQUACEL AG SP 3.5X10 (GAUZE/BANDAGES/DRESSINGS) ×1 IMPLANT
DRSG AQUACEL AG ADV 3.5X10 (GAUZE/BANDAGES/DRESSINGS) IMPLANT
DRSG AQUACEL AG SP 3.5X10 (GAUZE/BANDAGES/DRESSINGS) ×3
DRSG TEGADERM 4X4.75 (GAUZE/BANDAGES/DRESSINGS) IMPLANT
DURAPREP 26ML APPLICATOR (WOUND CARE) ×3 IMPLANT
ELECT BLADE TIP CTD 4 INCH (ELECTRODE) ×3 IMPLANT
ELECT REM PT RETURN 15FT ADLT (MISCELLANEOUS) ×3 IMPLANT
GAUZE SPONGE 2X2 8PLY STRL LF (GAUZE/BANDAGES/DRESSINGS) IMPLANT
GLOVE BIOGEL PI IND STRL 6.5 (GLOVE) ×1 IMPLANT
GLOVE BIOGEL PI IND STRL 7.0 (GLOVE) ×1 IMPLANT
GLOVE BIOGEL PI IND STRL 7.5 (GLOVE) ×5 IMPLANT
GLOVE BIOGEL PI IND STRL 8 (GLOVE) ×1 IMPLANT
GLOVE BIOGEL PI IND STRL 9 (GLOVE) ×1 IMPLANT
GLOVE BIOGEL PI INDICATOR 6.5 (GLOVE) ×2
GLOVE BIOGEL PI INDICATOR 7.0 (GLOVE) ×2
GLOVE BIOGEL PI INDICATOR 7.5 (GLOVE) ×10
GLOVE BIOGEL PI INDICATOR 8 (GLOVE) ×2
GLOVE BIOGEL PI INDICATOR 9 (GLOVE) ×2
GLOVE ECLIPSE 8.5 STRL (GLOVE) ×3 IMPLANT
GLOVE SURG SS PI 7.0 STRL IVOR (GLOVE) ×6 IMPLANT
GLOVE SURG SS PI 7.5 STRL IVOR (GLOVE) ×3 IMPLANT
GLOVE SURG SS PI 8.0 STRL IVOR (GLOVE) ×6 IMPLANT
GOWN STRL REUS W/ TWL XL LVL3 (GOWN DISPOSABLE) ×2 IMPLANT
GOWN STRL REUS W/TWL 2XL LVL3 (GOWN DISPOSABLE) ×3 IMPLANT
GOWN STRL REUS W/TWL XL LVL3 (GOWN DISPOSABLE) ×10 IMPLANT
HANDPIECE INTERPULSE COAX TIP (DISPOSABLE) ×2
HEMOSTAT SPONGE AVITENE ULTRA (HEMOSTASIS) ×3 IMPLANT
HOLDER FOLEY CATH W/STRAP (MISCELLANEOUS) IMPLANT
IMMOBILIZER KNEE 20 (SOFTGOODS) ×3
IMMOBILIZER KNEE 20 THIGH 36 (SOFTGOODS) ×1 IMPLANT
MANIFOLD NEPTUNE II (INSTRUMENTS) ×3 IMPLANT
NDL SAFETY ECLIPSE 18X1.5 (NEEDLE) IMPLANT
NEEDLE HYPO 18GX1.5 SHARP (NEEDLE)
PACK TOTAL KNEE CUSTOM (KITS) ×3 IMPLANT
POSITIONER SURGICAL ARM (MISCELLANEOUS) ×3 IMPLANT
SEALER BIPOLAR AQUA 6.0 (INSTRUMENTS) IMPLANT
SET HNDPC FAN SPRY TIP SCT (DISPOSABLE) ×1 IMPLANT
SPONGE GAUZE 2X2 STER 10/PKG (GAUZE/BANDAGES/DRESSINGS)
SPONGE SURGIFOAM ABS GEL 100 (HEMOSTASIS) IMPLANT
STAPLER VISISTAT (STAPLE) IMPLANT
STRIP CLOSURE SKIN 1/2X4 (GAUZE/BANDAGES/DRESSINGS) ×2 IMPLANT
SUT BONE WAX W31G (SUTURE) ×3 IMPLANT
SUT MNCRL AB 4-0 PS2 18 (SUTURE) ×3 IMPLANT
SUT STRATAFIX 0 PDS 27 VIOLET (SUTURE) ×3
SUT VIC AB 1 CT1 27 (SUTURE) ×4
SUT VIC AB 1 CT1 27XBRD ANTBC (SUTURE) ×2 IMPLANT
SUT VIC AB 2-0 CT1 27 (SUTURE) ×6
SUT VIC AB 2-0 CT1 TAPERPNT 27 (SUTURE) ×3 IMPLANT
SUTURE STRATFX 0 PDS 27 VIOLET (SUTURE) ×1 IMPLANT
SYR 50ML LL SCALE MARK (SYRINGE) IMPLANT
TOWER CARTRIDGE SMART MIX (DISPOSABLE) ×3 IMPLANT
TRAY FOLEY CATH 14FR (SET/KITS/TRAYS/PACK) ×3 IMPLANT
WRAP KNEE MAXI GEL POST OP (GAUZE/BANDAGES/DRESSINGS) ×3 IMPLANT
YANKAUER SUCT BULB TIP 10FT TU (MISCELLANEOUS) ×3 IMPLANT
YANKAUER SUCT BULB TIP NO VENT (SUCTIONS) ×3 IMPLANT

## 2018-03-12 NOTE — Transfer of Care (Signed)
Immediate Anesthesia Transfer of Care Note  Patient: Alexandra Morrison  Procedure(s) Performed: LEFT TOTAL KNEE ARTHROPLASTY (Left Knee)  Patient Location: PACU  Anesthesia Type:General  Level of Consciousness: awake, alert  and patient cooperative  Airway & Oxygen Therapy: Patient Spontanous Breathing and Patient connected to face mask oxygen  Post-op Assessment: Report given to RN and Post -op Vital signs reviewed and stable  Post vital signs: Reviewed and stable  Last Vitals:  Vitals Value Taken Time  BP 120/49 03/12/2018  9:49 AM  Temp    Pulse 84 03/12/2018  9:53 AM  Resp 15 03/12/2018  9:53 AM  SpO2 100 % 03/12/2018  9:53 AM  Vitals shown include unvalidated device data.  Last Pain:  Vitals:   03/12/18 0622  TempSrc:   PainSc: 0-No pain         Complications: No apparent anesthesia complications

## 2018-03-12 NOTE — Brief Op Note (Signed)
03/12/2018  9:21 AM  PATIENT:  Alexandra Morrison  72 y.o. female  PRE-OPERATIVE DIAGNOSIS:  Left knee osteoarthritis  POST-OPERATIVE DIAGNOSIS:  Left knee osteoarthritis  PROCEDURE:  Procedure(s) with comments: LEFT TOTAL KNEE ARTHROPLASTY (Left) - Adductor Block  SURGEON:  Surgeon(s) and Role:    Susa Day, MD - Primary  PHYSICIAN ASSISTANT:   ASSISTANTS: [place]   ANESTHESIA:   general  EBL:  50 mL   BLOOD ADMINISTERED:none  DRAINS: none   LOCAL MEDICATIONS USED:  MARCAINE     SPECIMEN:  No Specimen  DISPOSITION OF SPECIMEN:  N/A  COUNTS:  YES  TOURNIQUET:   Total Tourniquet Time Documented: Thigh (Left) - 52 minutes Total: Thigh (Left) - 52 minutes   DICTATION: .Other Dictation: Dictation Number  [id number]  PLAN OF CARE: Admit to inpatient   PATIENT DISPOSITION:  PACU - hemodynamically stable.   Delay start of Pharmacological VTE agent (>24hrs) due to surgical blood loss or risk of bleeding: no

## 2018-03-12 NOTE — Anesthesia Procedure Notes (Signed)
Anesthesia Regional Block: Adductor canal block   Pre-Anesthetic Checklist: ,, timeout performed, Correct Patient, Correct Site, Correct Laterality, Correct Procedure, Correct Position, site marked, Risks and benefits discussed,  Surgical consent,  Pre-op evaluation,  At surgeon's request and post-op pain management  Laterality: Left  Prep: chloraprep       Needles:  Injection technique: Single-shot  Needle Type: Stimiplex     Needle Length: 9cm  Needle Gauge: 21     Additional Needles:   Procedures:,,,, ultrasound used (permanent image in chart),,,,  Narrative:  Start time: 03/12/2018 7:24 AM End time: 03/12/2018 7:26 AM Injection made incrementally with aspirations every 5 mL.  Performed by: Personally  Anesthesiologist: Nolon Nations, MD  Additional Notes: BP cuff, EKG monitors applied. Sedation begun. Artery and nerve location verified with U/S and anesthetic injected incrementally, slowly, and after negative aspirations under direct u/s guidance. Good fascial /perineural spread. Tolerated well.

## 2018-03-12 NOTE — Discharge Instructions (Signed)

## 2018-03-12 NOTE — Evaluation (Signed)
Physical Therapy Evaluation Patient Details Name: Alexandra Morrison MRN: [id number] DOB: [birth year] Today's Date: 03/12/2018   History of Present Illness  Pt s/p L TKR and with hx of R TKR(18), bil TSR, DM, bipolar, spinal stenosis, and vertigo  Clinical Impression  Pt s.p L TKR and presents with decreased L LE strength/ROM and post op pain limiting functional mobility.  Pt should progress to dc home with family assist.  Pt reports first OP PT scheduled for Tuesday 03/18/18.    Follow Up Recommendations Follow surgeon's recommendation for DC plan and follow-up therapies    Equipment Recommendations  None recommended by PT    Recommendations for Other Services       Precautions / Restrictions Precautions Precautions: Fall Required Braces or Orthoses: Knee Immobilizer - Left Knee Immobilizer - Left: Discontinue once straight leg raise with < 10 degree lag Restrictions Weight Bearing Restrictions: No Other Position/Activity Restrictions: WBAT      Mobility  Bed Mobility Overal bed mobility: Needs Assistance Bed Mobility: Supine to Sit     Supine to sit: Min assist     General bed mobility comments: cues for sequence and use of R LE to self assist  Transfers Overall transfer level: Needs assistance Equipment used: Rolling walker (2 wheeled) Transfers: Sit to/from Stand Sit to Stand: Min assist         General transfer comment: cues for LE management and use of UEs to self assist  Ambulation/Gait Ambulation/Gait assistance: Min assist Ambulation Distance (Feet): 45 Feet Assistive device: Rolling walker (2 wheeled) Gait Pattern/deviations: Step-to pattern;Decreased step length - right;Decreased step length - left;Shuffle;Trunk flexed Gait velocity: decr   General Gait Details: cues for posture, position from RW and sequence  Stairs            Wheelchair Mobility    Modified Rankin (Stroke Patients Only)       Balance                                              Pertinent Vitals/Pain Pain Assessment: 0-10 Pain Score: 2  Pain Location: L knee Pain Descriptors / Indicators: Sore Pain Intervention(s): Limited activity within patient's tolerance;Monitored during session;Premedicated before session;Ice applied    Home Living Family/patient expects to be discharged to:: Private residence Living Arrangements: Spouse/significant other Available Help at Discharge: Family Type of Home: House Home Access: Stairs to enter   Technical brewer of Steps: 1+1 Home Layout: One level Home Equipment: Environmental consultant - 2 wheels;Bedside commode      Prior Function Level of Independence: Independent               Hand Dominance        Extremity/Trunk Assessment   Upper Extremity Assessment Upper Extremity Assessment: Overall WFL for tasks assessed    Lower Extremity Assessment Lower Extremity Assessment: LLE deficits/detail       Communication   Communication: No difficulties  Cognition Arousal/Alertness: Awake/alert Behavior During Therapy: WFL for tasks assessed/performed Overall Cognitive Status: Within Functional Limits for tasks assessed                                        General Comments      Exercises Total Joint Exercises Ankle Circles/Pumps: AROM;Both;15 reps;Supine   Assessment/Plan  PT Assessment Patient needs continued PT services  PT Problem List Decreased strength;Decreased range of motion;Decreased activity tolerance;Decreased mobility;Decreased knowledge of use of DME;Pain       PT Treatment Interventions DME instruction;Stair training;Gait training;Functional mobility training;Therapeutic activities;Therapeutic exercise;Patient/family education    PT Goals (Current goals can be found in the Care Plan section)  Acute Rehab PT Goals Patient Stated Goal: Regain IND PT Goal Formulation: With patient Time For Goal Achievement: 03/19/18 Potential to Achieve  Goals: Good    Frequency 7X/week   Barriers to discharge        Co-evaluation               AM-PAC PT "6 Clicks" Daily Activity  Outcome Measure Difficulty turning over in bed (including adjusting bedclothes, sheets and blankets)?: A Lot Difficulty moving from lying on back to sitting on the side of the bed? : Unable Difficulty sitting down on and standing up from a chair with arms (e.g., wheelchair, bedside commode, etc,.)?: Unable Help needed moving to and from a bed to chair (including a wheelchair)?: A Little Help needed walking in hospital room?: A Little Help needed climbing 3-5 steps with a railing? : A Little 6 Click Score: 13    End of Session Equipment Utilized During Treatment: Gait belt;Left knee immobilizer Activity Tolerance: Patient tolerated treatment well Patient left: in chair;with call bell/phone within reach;with chair alarm set Nurse Communication: Mobility status PT Visit Diagnosis: Difficulty in walking, not elsewhere classified (R26.2)    Time: 1550-1610 PT Time Calculation (min) (ACUTE ONLY): 20 min   Charges:   PT Evaluation $PT Eval Low Complexity: 1 Low     PT G Codes:        Pg [phone]   Milee Qualls 03/12/2018, 5:54 PM

## 2018-03-12 NOTE — Interval H&P Note (Signed)
History and Physical Interval Note:  03/12/2018 7:25 AM  Alexandra Morrison  has presented today for surgery, with the diagnosis of Left knee osteoarthritis  The various methods of treatment have been discussed with the patient and family. After consideration of risks, benefits and other options for treatment, the patient has consented to  Procedure(s) with comments: LEFT TOTAL KNEE ARTHROPLASTY (Left) - 2 hrs as a surgical intervention .  The patient's history has been reviewed, patient examined, no change in status, stable for surgery.  I have reviewed the patient's chart and labs.  Questions were answered to the patient's satisfaction.     [name]

## 2018-03-12 NOTE — Anesthesia Postprocedure Evaluation (Signed)
Anesthesia Post Note  Patient: Alexandra Morrison  Procedure(s) Performed: LEFT TOTAL KNEE ARTHROPLASTY (Left Knee)     Patient location during evaluation: PACU Anesthesia Type: General Level of consciousness: sedated and patient cooperative Pain management: pain level controlled Vital Signs Assessment: post-procedure vital signs reviewed and stable Respiratory status: spontaneous breathing Cardiovascular status: stable Anesthetic complications: no    Last Vitals:  Vitals:   03/12/18 1308 03/12/18 1410  BP: (!) 104/51 (!) 103/54  Pulse: 72 73  Resp: 16 14  Temp: 36.6 C 36.6 C  SpO2: 100% 97%    Last Pain:  Vitals:   03/12/18 1410  TempSrc: Oral  PainSc:                  Nolon Nations

## 2018-03-12 NOTE — Anesthesia Procedure Notes (Signed)
Procedure Name: Intubation Date/Time: 03/12/2018 7:43 AM Performed by: Montel Clock, CRNA Pre-anesthesia Checklist: Patient identified, Emergency Drugs available, Suction available, Patient being monitored and Timeout performed Patient Re-evaluated:Patient Re-evaluated prior to induction Oxygen Delivery Method: Circle system utilized Preoxygenation: Pre-oxygenation with 100% oxygen Induction Type: IV induction Ventilation: Mask ventilation without difficulty and Oral airway inserted - appropriate to patient size Laryngoscope Size: Mac and 3 Grade View: Grade I Tube type: Oral Tube size: 7.0 mm Number of attempts: 1 Airway Equipment and Method: Stylet Placement Confirmation: ETT inserted through vocal cords under direct vision,  positive ETCO2 and breath sounds checked- equal and bilateral Secured at: 21 cm Tube secured with: Tape Dental Injury: Teeth and Oropharynx as per pre-operative assessment

## 2018-03-12 NOTE — Anesthesia Preprocedure Evaluation (Addendum)
Anesthesia Evaluation  Patient identified by MRN, date of birth, ID band Patient awake    Reviewed: Allergy & Precautions, NPO status , Patient's Chart, lab work & pertinent test results  History of Anesthesia Complications Negative for: history of anesthetic complications  Airway Mallampati: III  TM Distance: >3 FB Neck ROM: Full  Mouth opening: Limited Mouth Opening  Dental  (+) Teeth Intact, Dental Advisory Given, Chipped   Pulmonary former smoker,    breath sounds clear to auscultation       Cardiovascular + Peripheral Vascular Disease   Rhythm:Regular Rate:Normal     Neuro/Psych PSYCHIATRIC DISORDERS Anxiety Bipolar Disorder negative neurological ROS     GI/Hepatic negative GI ROS, Neg liver ROS,   Endo/Other  Hypothyroidism   Renal/GU negative Renal ROS     Musculoskeletal  (+) Arthritis ,   Abdominal   Peds  Hematology negative hematology ROS (+)   Anesthesia Other Findings   Reproductive/Obstetrics                            Anesthesia Physical  Anesthesia Plan  ASA: II  Anesthesia Plan: General   Post-op Pain Management: GA combined w/ Regional for post-op pain   Induction:   PONV Risk Score and Plan: 3 and Treatment may vary due to age or medical condition, Ondansetron and Dexamethasone  Airway Management Planned: Oral ETT  Additional Equipment: None  Intra-op Plan:   Post-operative Plan: Extubation in OR  Informed Consent: I have reviewed the patients History and Physical, chart, labs and discussed the procedure including the risks, benefits and alternatives for the proposed anesthesia with the patient or authorized representative who has indicated his/her understanding and acceptance.   Dental advisory given  Plan Discussed with: CRNA  Anesthesia Plan Comments:       Anesthesia Quick Evaluation

## 2018-03-12 NOTE — Op Note (Signed)
NAME: TAMAIRA, CIRIELLO MEDICAL RECORD AV:[id number] ACCOUNT [id number] DATE OF BIRTH:26-Sep-1946 FACILITY: [facility] LOCATION: [facility] PHYSICIAN:Freddie Nghiem Windy Kalata, MD  OPERATIVE REPORT  DATE OF PROCEDURE:  03/12/2018  PREOPERATIVE DIAGNOSIS:  End-stage osteoarthritis of the left knee with a varus deformity.  POSTOPERATIVE DIAGNOSIS:  End-stage osteoarthritis of the left knee with a varus deformity.  PROCEDURE PERFORMED:  Left total knee arthroplasty utilizing DePuy rotating platform 4 femur, 4 tibia, 10 mm insert.  ANESTHESIA:  General.  ASSISTANT:  Lacie Draft, PA-C  HISTORY:  This is a 72-year-old female with end-stage osteoarthritis of the left knee with a varus deformity.  She had end-stage medial compartment arthrosis in the patellofemoral joint with instability.  The patient was indicated for replacement of the  degenerative joint.  Risks and benefits discussed including bleeding, infection, damage to neurovascular structures, no change in symptoms or worsening symptoms, DVT, PE, anesthetic complications, etc.  TECHNIQUE:  The patient in supine position.  After induction of adequate anesthesia and 2 grams Kefzol.  Left lower extremity was prepped, draped, and exsanguinated in usual sterile fashion.  Thigh tourniquet inflated to 225 mmHg.  A midline incision was  then made over the knee.  Full thickness flap down to the patella.  Full thickness flaps developed.  Median parapatellar arthrotomy was performed.  A copious clear synovial fluid was evacuated.  Patella everted and knee flexed.  Tricompartmental  osteoarthrosis bone-on-bone was noted particularly in the medial compartment laterally as well and of the patellofemoral joint.  Remnants of the medial and lateral menisci were removed as well as the ACL.  A notch was placed in the femur and gently use a  step drill.  The end of the femoral canal was irrigated.  Gently a 5 degree left was inserted partially due to the short femur 5  degree left was utilized off the distal femur.  This cut was then performed.  An oscillating saw, protecting the soft  tissues posteriorly.  We sized the femur off the anterolateral cortex between a 3 and a 4.  We felt 3 would be less resection posteriorly.  This was pinned in 3 degrees of external rotation and a 4 block was then placed.  Upon the anterior, posterior and  chamfer cuts, soft tissues were protected with a curved Crego posteriorly at all times.  Then, the tibia was subluxed, eburnated bone posterior medially.  This was then measured at 2 mm from this, which was 10 from the high side, laterally bisecting the  tibiotalar joint parallel to the shaft, 3 degrees of slope.  This was then pinned.  Protecting the soft tissues posteriorly, we  performed our proximal tibial cut.  Checked posteriorly, remnants of the menisci were removed, cauterized.  The popliteus  was attenuated, cauterized geniculates.  The knee was flexed.  Tibia was subluxed, measured at a 4 just the medial aspect of the tibial tubercle and maximal coverage.  This was then pinned.  We harvested bone centrally but severely osteoporotic bone.  We  then centrally drilled using a punch guide.  Then turned our attention back to the femur, used a box cutting guide, placed it centrally.  Performed our box cut.  This was then removed.  Again, soft bone noted.  I placed the trial femur and drilled our  lug holes.  There was excellent fit.  I reduced it with a 6 mm insert, slight hyperextension.  Slight laxity laterally.  Full flexion was noted.  The patella was everted, measured, planed to a  14.  It was a 21.  Maximize coverage with a 38.  I drilled  the peg holes medializing them, traced a trial patella, reduced it and had excellent patellofemoral tracking.  Full flexion, full extension.  Slight opening with full extension laterally a millimeter or 2.  I placed an 8 mm insert in there.  It seemed  better.  I removed all instrumentation.   Pulsatile lavage within the joint.  Flexed and all surfaces were thoroughly dried.  Next, cement back table in the appropriate fashion and then injected it onto the tibia digitally pressurizing it.  Impacted the tibial  tray.  Redundant cement removed.  Cement and impacted the femur.  Redundant cement removed.  Placed an 80 insert and reduced it.  Cemented and clamped the patella, held in extension.  Next, copious pulsatile lavage was done and cement removed.  After  curing of the cement, tested full flexion, full extension, good stability to varus and valgus stressing at 30 degrees and at 0 degrees, perhaps a millimeter lift off laterally.  We then tried a 10.  After a slight release medially initially was tight  slight, release medially.  Now, we had good stability to varus valgus stressing 0-30 degrees.  Negative anterior drawer.  Full range of motion.  Tourniquet was deflated.  After this 10 trial was removed, we meticulously removed all redundant cement,  cauterized geniculates posterolaterally.  We then placed our 10 insert.  We had reduced it at full extension, full flexion, good stability with varus valgus stress at 0 and 30 degrees.  I released the tourniquet, 55 minutes.  A little bit of bleeding  laterally.  We cauterized all bleeding sites.  Bone wax placed in the cancellous surfaces.  We flexed the tibia and subluxed it and just cauterized posterolaterally where there was some bleeding.  Just cauterized it with no active bleeding.  It was  copiously irrigated with antibiotic irrigation.  We then reapproximated the patellar tendon in flexion with #1 Vicryl interrupted figure-of-eight sutures and then oversewn with a running Stratafix.  Flexion gravity at 90 degrees was noted and we had  excellent patellofemoral tracking and good stability, full flexion, full extension.  Next, copious irrigation.  Subcu with 2-0 and skin with Prolene.  Sterile dressing applied.    Placed in immobilizer,  extubated without difficulty and transported to the recovery room in satisfactory condition.  The patient tolerated the procedure well.  No complications.  Assistant Lacie Draft, [place].    Minimal blood loss.  AN/NUANCE  D:03/12/2018 T:03/12/2018 JOB:[id number]

## 2018-03-13 LAB — BASIC METABOLIC PANEL
ANION GAP: 10 (ref 5–15)
BUN: 12 mg/dL (ref 6–20)
CALCIUM: 8.2 mg/dL — AB (ref 8.9–10.3)
CO2: 25 mmol/L (ref 22–32)
CREATININE: 0.82 mg/dL (ref 0.44–1.00)
Chloride: 105 mmol/L (ref 101–111)
GFR calc non Af Amer: >60 mL/min (ref >60)
Glucose, Bld: 103 mg/dL — ABNORMAL HIGH (ref 65–99)
Potassium: 4.3 mmol/L (ref 3.5–5.1)
SODIUM: 140 mmol/L (ref 135–145)

## 2018-03-13 LAB — CBC
HEMATOCRIT: 31 % — AB (ref 36.0–46.0)
Hemoglobin: 10 g/dL — ABNORMAL LOW (ref 12.0–15.0)
MCH: 31 pg (ref 26.0–34.0)
MCHC: 32.3 g/dL (ref 30.0–36.0)
MCV: 96 fL (ref 78.0–100.0)
Platelets: 185 10*3/uL (ref 150–400)
RBC: 3.23 MIL/uL — ABNORMAL LOW (ref 3.87–5.11)
RDW: 13.3 % (ref 11.5–15.5)
WBC: 9.3 10*3/uL (ref 4.0–10.5)

## 2018-03-13 NOTE — Progress Notes (Signed)
Care Plan Notes 12/13/2017 to 03/13/2018       Care Plan by Leonides Grills A at 03/11/2018 12:16 PM    Date of Service   Author Author Type Status Note Type File Time  03/11/2018  Alexandra Morrison  Signed Care Plan 03/11/2018             Ortho Bundle L TKA scheduled 03-12-18 DCP:  Home with spouse.  1 story home with 1 ste.   DME:  No needs.  Has equipment from previous R TKA PT:  [facility].  Eval scheduled on 03-18-18.

## 2018-03-13 NOTE — Progress Notes (Signed)
Subjective: 1 Day Post-Op Procedure(s) (LRB): LEFT TOTAL KNEE ARTHROPLASTY (Left) Patient reports pain as 4 on 0-10 scale.    Objective: Vital signs in last 24 hours: Temp:  [97.6 F (36.4 C)-98.2 F (36.8 C)] 98.1 F (36.7 C) (05/23 0540) Pulse Rate:  [67-85] 83 (05/23 0642) Resp:  [12-18] 18 (05/23 0446) BP: (90-121)/(32-64) 105/38 (05/23 0642) SpO2:  [93 %-100 %] 93 % (05/23 0540)  Intake/Output from previous day: 05/22 0701 - 05/23 0700 In: 3925.8 [P.O.:970; I.V.:2800.8; IV Piggyback:155] Out: 2300 [Urine:2250; Blood:50] Intake/Output this shift: No intake/output data recorded.  Recent Labs    03/13/18 0551  HGB 10.0*   Recent Labs    03/13/18 0551  WBC 9.3  RBC 3.23*  HCT 31.0*  PLT 185   Recent Labs    03/13/18 0551  NA 140  K 4.3  CL 105  CO2 25  BUN 12  CREATININE 0.82  GLUCOSE 103*  CALCIUM 8.2*   No results for input(s): LABPT, INR in the last 72 hours.  Neurologically intact Neurovascular intact Sensation intact distally Intact pulses distally Incision: dressing C/D/I  Anticipated LOS equal to or greater than 2 midnights due to - Age 46 and older with one or more of the following:  - Obesity  - Expected need for hospital services (PT, OT, Nursing) required for safe  discharge  - Anticipated need for postoperative skilled nursing care or inpatient rehab  - Active co-morbidities: Coronary Artery Disease OR   - Unanticipated findings during/Post Surgery: None  - Patient is a high risk of re-admission due to: None   Assessment/Plan: 1 Day Post-Op Procedure(s) (LRB): LEFT TOTAL KNEE ARTHROPLASTY (Left) Advance diet Up with therapy D/C IV fluids Plan for discharge tomorrow    Geovanni Rahming C 03/13/2018, 7:19 AM

## 2018-03-13 NOTE — Progress Notes (Signed)
PT Cancellation Note  Patient Details Name: Alexandra Morrison MRN: [id number] DOB: December 25, 1945   Cancelled Treatment:     pt back in bed reports "a lot pain".  Unable to tolerate "any more" activity.  Requested to rest "I got no sleep last night".  Repositioned to comfort.     Rica Koyanagi  PTA [facility]  Acute  Rehab Pager      [phone]

## 2018-03-13 NOTE — Progress Notes (Signed)
CSW consult  Plan: Home  No SNF identified. CSW signing off.   Kathrin Greathouse, Latanya Presser, MSW Clinical Social Worker  [phone] 03/13/2018  2:13 PM

## 2018-03-13 NOTE — Progress Notes (Signed)
Physical Therapy Treatment Patient Details Name: Alexandra Morrison MRN: [id number] DOB: January 14, 1946 Today's Date: 03/13/2018    History of Present Illness Pt s/p L TKR and with hx of R TKR(18), bil TSR, DM, bipolar, spinal stenosis, and vertigo    PT Comments    POD # 1 am session Applied KI and instructed on use for amb and stairs.  Assisted OOB required much effort and increased time due to increased pain level.  Assisted with amb a decreased distance with difficulty fully WBing thru L LE and reports of 8/10 pain.  RN notified and IV pain meds given during session.  Returned to room in recliner.  Performed very few TKR TE's due to pain and applied ICE.     Follow Up Recommendations  Follow surgeon's recommendation for DC plan and follow-up therapies     Equipment Recommendations  None recommended by PT    Recommendations for Other Services       Precautions / Restrictions Precautions Precautions: Fall Precaution Comments: Instructed on KI use for amb and stairs  Required Braces or Orthoses: Knee Immobilizer - Left Knee Immobilizer - Left: Discontinue once straight leg raise with < 10 degree lag Restrictions Weight Bearing Restrictions: No Other Position/Activity Restrictions: WBAT    Mobility  Bed Mobility Overal bed mobility: Needs Assistance Bed Mobility: Supine to Sit     Supine to sit: Min assist;Mod assist     General bed mobility comments: Mod Assist upper body and Min Assist L LE  Transfers Overall transfer level: Needs assistance Equipment used: Rolling walker (2 wheeled) Transfers: Sit to/from [facility] Sit to Stand: Min assist Stand pivot transfers: Min assist       General transfer comment: cues for LE management and use of UEs to self assist plus increased time  Ambulation/Gait Ambulation/Gait assistance: Min assist Ambulation Distance (Feet): 8 Feet Assistive device: Rolling walker (2 wheeled) Gait Pattern/deviations: Step-to  pattern;Decreased step length - right;Decreased step length - left;Shuffle;Trunk flexed Gait velocity: decreased   General Gait Details: decreased amb distance due to increased pain today > yesterday   Stairs             Wheelchair Mobility    Modified Rankin (Stroke Patients Only)       Balance                                            Cognition Arousal/Alertness: Awake/alert Behavior During Therapy: WFL for tasks assessed/performed Overall Cognitive Status: Within Functional Limits for tasks assessed                                        Exercises   Total Knee Replacement TE's 10 reps B LE ankle pumps 05 reps towel squeezes 05 reps knee presses  Followed by ICE    General Comments        Pertinent Vitals/Pain Pain Score: 8  Pain Location: L knee Pain Descriptors / Indicators: Burning;Aching;Constant;Operative site guarding;Discomfort;Grimacing Pain Intervention(s): Monitored during session;Limited activity within patient's tolerance;Premedicated before session;Repositioned;Ice applied    Home Living                      Prior Function            PT Goals (current  goals can now be found in the care plan section) Progress towards PT goals: Progressing toward goals    Frequency    7X/week      PT Plan Current plan remains appropriate    Co-evaluation              AM-PAC PT "6 Clicks" Daily Activity  Outcome Measure  Difficulty turning over in bed (including adjusting bedclothes, sheets and blankets)?: A Lot Difficulty moving from lying on back to sitting on the side of the bed? : Unable Difficulty sitting down on and standing up from a chair with arms (e.g., wheelchair, bedside commode, etc,.)?: Unable Help needed moving to and from a bed to chair (including a wheelchair)?: A Little Help needed walking in hospital room?: A Little Help needed climbing 3-5 steps with a railing? : A  Little 6 Click Score: 13    End of Session Equipment Utilized During Treatment: Gait belt;Left knee immobilizer Activity Tolerance: Patient tolerated treatment well Patient left: in chair;with call bell/phone within reach;with chair alarm set Nurse Communication: Mobility status PT Visit Diagnosis: Difficulty in walking, not elsewhere classified (R26.2)     Time: 0240-9735 PT Time Calculation (min) (ACUTE ONLY): 20 min  Charges:  $Gait Training: 8-22 mins                    G Codes:       Rica Koyanagi  PTA [facility]  Acute  Rehab Pager      [phone]

## 2018-03-14 LAB — CBC
HCT: 31.5 % — ABNORMAL LOW (ref 36.0–46.0)
HEMOGLOBIN: 10.3 g/dL — AB (ref 12.0–15.0)
MCH: 31 pg (ref 26.0–34.0)
MCHC: 32.7 g/dL (ref 30.0–36.0)
MCV: 94.9 fL (ref 78.0–100.0)
PLATELETS: 183 10*3/uL (ref 150–400)
RBC: 3.32 MIL/uL — AB (ref 3.87–5.11)
RDW: 13.4 % (ref 11.5–15.5)
WBC: 10.4 10*3/uL (ref 4.0–10.5)

## 2018-03-14 MED ORDER — METHOCARBAMOL 500 MG PO TABS: 500.0000 mg | ORAL_TABLET | Freq: Four times a day (QID) | ORAL | 1 refills | Status: AC | PRN

## 2018-03-14 MED ORDER — HYDROCODONE-ACETAMINOPHEN 7.5-325 MG PO TABS: 1.0000 | ORAL_TABLET | Freq: Four times a day (QID) | ORAL | 0 refills | Status: DC | PRN

## 2018-03-14 MED ORDER — ASPIRIN 81 MG PO CHEW: 81.0000 mg | CHEWABLE_TABLET | Freq: Two times a day (BID) | ORAL | 1 refills | Status: DC

## 2018-03-14 MED ORDER — POLYETHYLENE GLYCOL 3350 17 G PO PACK: 17.0000 g | PACK | Freq: Every day | ORAL | 0 refills | Status: DC | PRN

## 2018-03-14 MED ORDER — DOCUSATE SODIUM 100 MG PO CAPS: 100.0000 mg | ORAL_CAPSULE | Freq: Two times a day (BID) | ORAL | 1 refills | Status: DC

## 2018-03-14 NOTE — Progress Notes (Signed)
Subjective: 2 Days Post-Op Procedure(s) (LRB): LEFT TOTAL KNEE ARTHROPLASTY (Left) Patient reports pain as mild.  Reports pain better controlled today. No other c/o. Voiding well. Feels ready to go home today.  Objective: Vital signs in last 24 hours: Temp:  [98 F (36.7 C)-99 F (37.2 C)] 98.1 F (36.7 C) (05/24 0533) Pulse Rate:  [80-99] 96 (05/24 0533) Resp:  [16] 16 (05/23 1719) BP: (100-141)/(44-66) 111/50 (05/24 0533) SpO2:  [89 %-98 %] 91 % (05/24 0533)  Intake/Output from previous day: 05/23 0701 - 05/24 0700 In: 1789.6 [P.O.:670; I.V.:1064.6; IV Piggyback:55] Out: 2450 [Urine:2450] Intake/Output this shift: No intake/output data recorded.  Recent Labs    03/13/18 0551 03/14/18 0540  HGB 10.0* 10.3*   Recent Labs    03/13/18 0551 03/14/18 0540  WBC 9.3 10.4  RBC 3.23* 3.32*  HCT 31.0* 31.5*  PLT 185 183   Recent Labs    03/13/18 0551  NA 140  K 4.3  CL 105  CO2 25  BUN 12  CREATININE 0.82  GLUCOSE 103*  CALCIUM 8.2*   No results for input(s): LABPT, INR in the last 72 hours.  Neurologically intact ABD soft Neurovascular intact Sensation intact distally Intact pulses distally Dorsiflexion/Plantar flexion intact Incision: dressing C/D/I and no drainage No cellulitis present Compartment soft no calf pain or sign of DVT  Anticipated LOS equal to or greater than 2 midnights due to - Age 69 and older with one or more of the following:  - Obesity  - Expected need for hospital services (PT, OT, Nursing) required for safe  discharge  - Anticipated need for postoperative skilled nursing care or inpatient rehab  - Active co-morbidities: None OR   - Unanticipated findings during/Post Surgery: None  - Patient is a high risk of re-admission due to: None   Assessment/Plan: 2 Days Post-Op Procedure(s) (LRB): LEFT TOTAL KNEE ARTHROPLASTY (Left) Advance diet Up with therapy D/C IV fluids Discussed D/C instructions D/C today as long as pain  remains well controlled and does well with PT sessions Pt is set up to start outpt PT Tuesday, will work on HEP through the weekend Will discuss with Dr.Beane  BISSELL, JACLYN M. 03/14/2018, 8:29 AM

## 2018-03-14 NOTE — Progress Notes (Signed)
Physical Therapy Treatment Patient Details Name: Alexandra Morrison MRN: [id number] DOB: 04-23-1946 Today's Date: 03/14/2018    History of Present Illness Pt s/p L TKR and with hx of R TKR(18), bil TSR, DM, bipolar, spinal stenosis, and vertigo    PT Comments    POD # 2 am session Assisted with amb a greater distance with decreased pain vs yesterday.  Much improved.  Performed some TKR TE's following HEP handout  followed by ICE. Pt plans to D/C to home later today.   Follow Up Recommendations  Follow surgeon's recommendation for DC plan and follow-up therapies     Equipment Recommendations  None recommended by PT    Recommendations for Other Services       Precautions / Restrictions Precautions Precautions: Fall Precaution Comments: did not use KI today pt tolerated well Restrictions Weight Bearing Restrictions: No Other Position/Activity Restrictions: WBAT    Mobility  Bed Mobility               General bed mobility comments: OOB in recliner  Transfers Overall transfer level: Needs assistance Equipment used: Rolling walker (2 wheeled) Transfers: Sit to/from [employer] Transfers   Stand pivot transfers: Supervision;Min guard       General transfer comment: cues for LE management and use of UEs to self assist plus increased time  Ambulation/Gait Ambulation/Gait assistance: Supervision;Min guard Ambulation Distance (Feet): 45 Feet Assistive device: Rolling walker (2 wheeled) Gait Pattern/deviations: Step-to pattern;Decreased step length - right;Decreased step length - left;Shuffle;Trunk flexed Gait velocity: decreased   General Gait Details: tolerated an increased distance with decreased pain vs yesterday   Stairs             Wheelchair Mobility    Modified Rankin (Stroke Patients Only)       Balance                                            Cognition Arousal/Alertness: Awake/alert Behavior During Therapy:  WFL for tasks assessed/performed Overall Cognitive Status: Within Functional Limits for tasks assessed                                 General Comments: much better day      Exercises   Total Knee Replacement TE's 10 reps B LE ankle pumps 10 reps towel squeezes 10 reps knee presses 10 reps heel slides  10 reps SAQ's 10 reps SLR's 10 reps ABD Followed by ICE     General Comments        Pertinent Vitals/Pain Pain Score: 2  Pain Location: L Knee  much improved Pain Descriptors / Indicators: Discomfort Pain Intervention(s): Monitored during session;Repositioned;Ice applied;Premedicated before session    Home Living                      Prior Function            PT Goals (current goals can now be found in the care plan section)      Frequency    7X/week      PT Plan Current plan remains appropriate    Co-evaluation              AM-PAC PT "6 Clicks" Daily Activity  Outcome Measure  Difficulty turning over in bed (including adjusting bedclothes, sheets and  blankets)?: A Little Difficulty moving from lying on back to sitting on the side of the bed? : A Little Difficulty sitting down on and standing up from a chair with arms (e.g., wheelchair, bedside commode, etc,.)?: A Little Help needed moving to and from a bed to chair (including a wheelchair)?: A Little Help needed walking in hospital room?: A Little Help needed climbing 3-5 steps with a railing? : A Little 6 Click Score: 18    End of Session Equipment Utilized During Treatment: Gait belt Activity Tolerance: Patient tolerated treatment well Patient left: in chair;with call bell/phone within reach;with chair alarm set Nurse Communication: Mobility status PT Visit Diagnosis: Difficulty in walking, not elsewhere classified (R26.2)     Time: 1130-1155 PT Time Calculation (min) (ACUTE ONLY): 25 min  Charges:  $Gait Training: 8-22 mins $Therapeutic Exercise: 8-22 mins                     G Codes:       Rica Koyanagi  PTA [facility]  Acute  Rehab Pager      [phone]

## 2018-03-14 NOTE — Progress Notes (Signed)
Physical Therapy Treatment Patient Details Name: Alexandra Morrison MRN: [id number] DOB: 11/30/1945 Today's Date: 03/14/2018    History of Present Illness Pt s/p L TKR and with hx of R TKR(18), bil TSR, DM, bipolar, spinal stenosis, and vertigo    PT Comments    POD # 2 pm session Assisted with amb a greater distance.  Progressing well.  Pt ready for D/C to home   Follow Up Recommendations  Follow surgeon's recommendation for DC plan and follow-up therapies     Equipment Recommendations  None recommended by PT    Recommendations for Other Services       Precautions / Restrictions Precautions Precautions: Fall Precaution Comments: did not use KI today pt tolerated well Restrictions Weight Bearing Restrictions: No Other Position/Activity Restrictions: WBAT    Mobility  Bed Mobility               General bed mobility comments: OOB in recliner  Transfers Overall transfer level: Needs assistance Equipment used: Rolling walker (2 wheeled) Transfers: Sit to/from [employer] Transfers   Stand pivot transfers: Supervision;Min guard       General transfer comment: cues for LE management and use of UEs to self assist plus increased time  Ambulation/Gait Ambulation/Gait assistance: Supervision;Min guard Ambulation Distance (Feet): 68 Feet Assistive device: Rolling walker (2 wheeled) Gait Pattern/deviations: Step-to pattern;Decreased step length - right;Decreased step length - left;Shuffle;Trunk flexed Gait velocity: decreased   General Gait Details: tolerated an increased distance with decreased pain vs yesterday   Stairs      no steps to enter home       Wheelchair Mobility    Modified Rankin (Stroke Patients Only)       Balance                                            Cognition Arousal/Alertness: Awake/alert Behavior During Therapy: WFL for tasks assessed/performed Overall Cognitive Status: Within Functional Limits  for tasks assessed                                 General Comments: much better day      Exercises      General Comments        Pertinent Vitals/Pain Pain Score: 2  Pain Location: L Knee  much improved Pain Descriptors / Indicators: Discomfort Pain Intervention(s): Monitored during session;Repositioned;Ice applied;Premedicated before session    Home Living                      Prior Function            PT Goals (current goals can now be found in the care plan section)      Frequency    7X/week      PT Plan Current plan remains appropriate    Co-evaluation              AM-PAC PT "6 Clicks" Daily Activity  Outcome Measure  Difficulty turning over in bed (including adjusting bedclothes, sheets and blankets)?: A Little Difficulty moving from lying on back to sitting on the side of the bed? : A Little Difficulty sitting down on and standing up from a chair with arms (e.g., wheelchair, bedside commode, etc,.)?: A Little Help needed moving to and from a bed to chair (including  a wheelchair)?: A Little Help needed walking in hospital room?: A Little Help needed climbing 3-5 steps with a railing? : A Little 6 Click Score: 18    End of Session Equipment Utilized During Treatment: Gait belt Activity Tolerance: Patient tolerated treatment well Patient left: in chair;with call bell/phone within reach;with chair alarm set Nurse Communication: Mobility status PT Visit Diagnosis: Difficulty in walking, not elsewhere classified (R26.2)     Time: 5625-6389 PT Time Calculation (min) (ACUTE ONLY): 15 min  Charges:  $Gait Training: 8-22 mins                   G Codes:       Rica Koyanagi  PTA [facility]  Acute  Rehab Pager      [phone]

## 2018-03-14 NOTE — Progress Notes (Signed)
Patient given discharge instructions. No questions or concerns.  

## 2018-03-14 NOTE — Discharge Summary (Signed)
Physician Discharge Summary   Patient ID: Alexandra Morrison MRN: [id number] DOB/AGE: [birth year] 72 y.o.  Admit date: 03/12/2018 Discharge date:   Primary Diagnosis: left knee primary osteoarthritis  Admission Diagnoses:  Past Medical History:  Diagnosis Date  . Abdominal aortic atherosclerosis (HCC)    Moderate  . Anxiety   . Arthritis   . Bipolar disorder ([place])    type 2 hypomanic  . Complication of anesthesia    pt request no spinal anesthesia only general  . DDD (degenerative disc disease), lumbar   . High cholesterol   . History of diabetes mellitus    resolved after weight loss  . History of mumps as a child   . History of sleep apnea    most recent study was negative for sleep apnea, resolved after weight loss   . Hypothyroidism   . Myelopathy ([place])   . Numbness and tingling in hands   . Osteopenia   . Osteoporosis   . Right arm fracture 1953   x2  . Scoliosis    per pt  . Spinal stenosis    Moderate to severe  . Vertigo   . Wears glasses    Discharge Diagnoses:   Active Problems:   Left knee DJD  Estimated body mass index is 27.09 kg/m as calculated from the following:   Height as of this encounter: 4' 10.75" (1.492 m).   Weight as of this encounter: 60.3 kg (133 lb).  Procedure:  Procedure(s) (LRB): LEFT TOTAL KNEE ARTHROPLASTY (Left)   Consults: None  HPI: see H&P Laboratory Data: Admission on 03/12/2018  Component Date Value Ref Range Status  . WBC 03/13/2018 9.3  4.0 - 10.5 K/uL Final  . RBC 03/13/2018 3.23* 3.87 - 5.11 MIL/uL Final  . Hemoglobin 03/13/2018 10.0* 12.0 - 15.0 g/dL Final  . HCT 03/13/2018 31.0* 36.0 - 46.0 % Final  . MCV 03/13/2018 96.0  78.0 - 100.0 fL Final  . MCH 03/13/2018 31.0  26.0 - 34.0 pg Final  . MCHC 03/13/2018 32.3  30.0 - 36.0 g/dL Final  . RDW 03/13/2018 13.3  11.5 - 15.5 % Final  . Platelets 03/13/2018 185  150 - 400 K/uL Final   Performed at [facility], [place] [address]  [place], [place] [address]  . Sodium 03/13/2018 140  135 - 145 mmol/L Final  . Potassium 03/13/2018 4.3  3.5 - 5.1 mmol/L Final  . Chloride 03/13/2018 105  101 - 111 mmol/L Final  . CO2 03/13/2018 25  22 - 32 mmol/L Final  . Glucose, Bld 03/13/2018 103* 65 - 99 mg/dL Final  . BUN 03/13/2018 12  6 - 20 mg/dL Final  . Creatinine, Ser 03/13/2018 0.82  0.44 - 1.00 mg/dL Final  . Calcium 03/13/2018 8.2* 8.9 - 10.3 mg/dL Final  . GFR calc non Af Amer 03/13/2018 >60  >60 mL/min Final  . GFR calc Af Amer 03/13/2018 >60  >60 mL/min Final   Comment: (NOTE) The eGFR has been calculated using the CKD EPI equation. This calculation has not been validated in all clinical situations. eGFR's persistently <60 mL/min signify possible Chronic Kidney Disease.   Georgiann Hahn gap 03/13/2018 10  5 - 15 Final   Performed at [facility], [place] [address], [place], [place] [address]  . WBC 03/14/2018 10.4  4.0 - 10.5 K/uL Final  . RBC 03/14/2018 3.32* 3.87 - 5.11 MIL/uL Final  . Hemoglobin 03/14/2018 10.3* 12.0 - 15.0 g/dL Final  . HCT 03/14/2018  31.5* 36.0 - 46.0 % Final  . MCV 03/14/2018 94.9  78.0 - 100.0 fL Final  . MCH 03/14/2018 31.0  26.0 - 34.0 pg Final  . MCHC 03/14/2018 32.7  30.0 - 36.0 g/dL Final  . RDW 03/14/2018 13.4  11.5 - 15.5 % Final  . Platelets 03/14/2018 183  150 - 400 K/uL Final   Performed at [facility], [place] [address], [place], [place] [address]  Hospital Outpatient Visit on 03/05/2018  Component Date Value Ref Range Status  . aPTT 03/05/2018 27  24 - 36 seconds Final   Performed at [facility], [place] [address], [place], [place] [address]  . Sodium 03/05/2018 143  135 - 145 mmol/L Final  . Potassium 03/05/2018 3.4* 3.5 - 5.1 mmol/L Final  . Chloride 03/05/2018 106  101 - 111 mmol/L Final  . CO2 03/05/2018 27  22 - 32 mmol/L Final  . Glucose, Bld 03/05/2018 97  65 - 99 mg/dL Final  . BUN 03/05/2018 11  6 - 20 mg/dL Final  .  Creatinine, Ser 03/05/2018 0.81  0.44 - 1.00 mg/dL Final  . Calcium 03/05/2018 8.6* 8.9 - 10.3 mg/dL Final  . GFR calc non Af Amer 03/05/2018 >60  >60 mL/min Final  . GFR calc Af Amer 03/05/2018 >60  >60 mL/min Final   Comment: (NOTE) The eGFR has been calculated using the CKD EPI equation. This calculation has not been validated in all clinical situations. eGFR's persistently <60 mL/min signify possible Chronic Kidney Disease.   Georgiann Hahn gap 03/05/2018 10  5 - 15 Final   Performed at [facility] [place] [address], [place], [place] [address]  . WBC 03/05/2018 7.8  4.0 - 10.5 K/uL Final  . RBC 03/05/2018 3.68* 3.87 - 5.11 MIL/uL Final  . Hemoglobin 03/05/2018 11.3* 12.0 - 15.0 g/dL Final  . HCT 03/05/2018 35.2* 36.0 - 46.0 % Final  . MCV 03/05/2018 95.7  78.0 - 100.0 fL Final  . MCH 03/05/2018 30.7  26.0 - 34.0 pg Final  . MCHC 03/05/2018 32.1  30.0 - 36.0 g/dL Final  . RDW 03/05/2018 13.2  11.5 - 15.5 % Final  . Platelets 03/05/2018 241  150 - 400 K/uL Final   Performed at [facility], [place] [address], [place], [place] [address]  . Prothrombin Time 03/05/2018 12.3  11.4 - 15.2 seconds Final  . INR 03/05/2018 0.92   Final   Performed at [place] [address] [place], [place] [address]  . Color, Urine 03/05/2018 COLORLESS* YELLOW Final  . APPearance 03/05/2018 CLEAR  CLEAR Final  . Specific Gravity, Urine 03/05/2018 1.004* 1.005 - 1.030 Final  . pH 03/05/2018 5.0  5.0 - 8.0 Final  . Glucose, UA 03/05/2018 NEGATIVE  NEGATIVE mg/dL Final  . Hgb urine dipstick 03/05/2018 NEGATIVE  NEGATIVE Final  . Bilirubin Urine 03/05/2018 NEGATIVE  NEGATIVE Final  . Ketones, ur 03/05/2018 NEGATIVE  NEGATIVE mg/dL Final  . Protein, ur 03/05/2018 NEGATIVE  NEGATIVE mg/dL Final  . Nitrite 03/05/2018 NEGATIVE  NEGATIVE Final  . Leukocytes, UA 03/05/2018 NEGATIVE  NEGATIVE Final   Performed at [place] [address], [place],  [address]  . MRSA, PCR 03/05/2018 NEGATIVE  NEGATIVE Final  . Staphylococcus aureus 03/05/2018 NEGATIVE  NEGATIVE Final   Comment: (NOTE) The Xpert SA Assay (FDA approved for NASAL specimens in patients 86 years of age and older), is one component of a comprehensive surveillance  program. It is not intended to diagnose infection nor to guide or monitor treatment. Performed at [facility], [place] [address] [place], [place] [address]      X-Rays:Ct Pelvis Wo Contrast  Result Date: 02/21/2018 CLINICAL DATA:  Low back pain with left hip pain for 3-4 weeks, fell 1 month ago EXAM: CT PELVIS WITHOUT CONTRAST TECHNIQUE: Multidetector CT imaging of the pelvis was performed following the standard protocol without intravenous contrast. COMPARISON:  None. FINDINGS: Urinary Tract: On this unenhanced study, the ureters are difficult to visualize but no dilatation of ureter is seen. The urinary bladder is decompressed and cannot be evaluated. Bowel: Also without oral contrast separation of small enlarged bowel is difficult but no gross abnormality is seen. By history the appendix has been resected. Vascular/Lymphatic: The abdominal aorta is normal in caliber with moderate abdominal aortic atherosclerosis. No adenopathy is noted. Reproductive: The uterus is difficult to visualize but appears normal in size for age. No definite adnexal lesion is seen. No fluid is noted within the pelvis. Other:  None. Musculoskeletal: There is anterolisthesis of L4 on L5 by approximately 7 mm due to degenerative change of the facet joints. There is degenerative disc disease at the L4-5 level as well. The bones are diffusely osteopenic. The sacral coccygeal elements are in normal position. No hip fracture is seen. The pelvic rami appear intact. There are degenerative changes in the SI joints but no fracture is seen. IMPRESSION: 1. Osteopenia and degenerative change. However no acute fracture is seen.  2. 7 mm anterolisthesis of L4 on L5 appears due to degenerative change of the facet joints with degenerative disc disease at that level. 3. Moderate abdominal aortic atherosclerosis. Electronically Signed   By: Ivar Drape M.D.   On: 02/21/2018 16:01   Dg Knee Left Port  Result Date: 03/12/2018 CLINICAL DATA:  Left knee replacement EXAM: PORTABLE LEFT KNEE - 1-2 VIEW COMPARISON:  None. FINDINGS: Left knee prosthesis is seen in satisfactory position. Postsurgical changes in the adjacent soft tissues are noted. IMPRESSION: Status post left knee replacement Electronically Signed   By: Inez Catalina M.D.   On: 03/12/2018 10:20    EKG: Orders placed or performed during the hospital encounter of 11/22/15  . EKG test  . EKG test     Hospital Course: Alexandra Morrison is a 72 y.o. who was admitted to [facility]. They were brought to the operating room on 03/12/2018 and underwent Procedure(s): LEFT TOTAL KNEE ARTHROPLASTY.  Patient tolerated the procedure well and was later transferred to the recovery room and then to the orthopaedic floor for postoperative care.  They were given PO and IV analgesics for pain control following their surgery.  They were given 24 hours of postoperative antibiotics of  Anti-infectives (From admission, onward)   Start     Dose/Rate Route Frequency Ordered Stop   03/12/18 1400  ceFAZolin (ANCEF) IVPB 2g/100 mL premix     2 g 200 mL/hr over 30 Minutes Intravenous Every 6 hours 03/12/18 1135 03/12/18 2008   03/12/18 0805  polymyxin B 500,000 Units, bacitracin 50,000 Units in sodium chloride 0.9 % 500 mL irrigation  Status:  Discontinued       As needed 03/12/18 0814 03/12/18 0946   03/12/18 0600  ceFAZolin (ANCEF) IVPB 2g/100 mL premix     2 g 200 mL/hr over 30 Minutes Intravenous On call to O.R. 03/12/18 1021 03/12/18 0749     and started on DVT prophylaxis in the form  of Aspirin, TED hose and SCDs.   PT and OT were ordered for total joint protocol.  Discharge  planning consulted to help with postop disposition and equipment needs.  Patient had a fair night on the evening of surgery.  They started to get up OOB with therapy on day one. Continued to work with therapy into day two. By day two, the patient had progressed with therapy and meeting their goals.  Incision was healing well.  Patient was seen in rounds and was ready to go home.   Diet: Regular diet Activity:WBAT Follow-up:in 10-14 days Disposition - Home to outpt PT Discharged Condition: good   Discharge Instructions    Call MD / Call 911   Complete by:  As directed    If you experience chest pain or shortness of breath, CALL 911 and be transported to the hospital emergency room.  If you develope a fever above 101 F, pus (white drainage) or increased drainage or redness at the wound, or calf pain, call your surgeon's office.   Constipation Prevention   Complete by:  As directed    Drink plenty of fluids.  Prune juice may be helpful.  You may use a stool softener, such as Colace (over the counter) 100 mg twice a day.  Use MiraLax (over the counter) for constipation as needed.   Diet - low sodium heart healthy   Complete by:  As directed    Increase activity slowly as tolerated   Complete by:  As directed      Allergies as of 03/14/2018   No Known Allergies     Medication List    STOP taking these medications   aspirin EC 81 MG tablet Replaced by:  aspirin 81 MG chewable tablet   lovastatin 10 MG tablet Commonly known as:  MEVACOR     TAKE these medications   acetaminophen 500 MG tablet Commonly known as:  TYLENOL Take 500-1,000 mg by mouth every 6 (six) hours as needed for mild pain or headache.   alendronate 70 MG tablet Commonly known as:  FOSAMAX Take 70 mg by mouth once a week. Mondays. Take with a full glass of water on an empty stomach.   ALPRAZolam 0.5 MG tablet Commonly known as:  XANAX Take 0.5 mg by mouth 2 (two) times daily as needed for anxiety.     amphetamine-dextroamphetamine 30 MG tablet Commonly known as:  ADDERALL Take 30 mg by mouth 2 (two) times daily with a meal.   aspirin 81 MG chewable tablet Chew 1 tablet (81 mg total) by mouth 2 (two) times daily. Replaces:  aspirin EC 81 MG tablet   b complex vitamins tablet Take 1 tablet by mouth daily.   Biotin 5000 MCG Tabs Take 1,000 mcg by mouth daily.   buPROPion 300 MG 24 hr tablet Commonly known as:  WELLBUTRIN XL Take 1 tablet by mouth every morning.   CALCIUM 600 + D PO Take 1 tablet by mouth 2 (two) times daily.   cholecalciferol 1000 units tablet Commonly known as:  VITAMIN D Take 1,000 Units by mouth See admin instructions. Takes 1000 units every third day.   divalproex 500 MG 24 hr tablet Commonly known as:  DEPAKOTE ER Take 500 mg by mouth daily.   docusate sodium 100 MG capsule Commonly known as:  COLACE Take 1 capsule (100 mg total) by mouth 2 (two) times daily.   DULoxetine 30 MG capsule Commonly known as:  CYMBALTA Take 30 mg by mouth every  evening.   furosemide 20 MG tablet Commonly known as:  LASIX Take 20-40 mg by mouth 2 (two) times daily as needed for fluid.   GLUCOSAMINE-CHONDROITIN PO Take 1 capsule by mouth 2 (two) times daily.   HYDROcodone-acetaminophen 7.5-325 MG tablet Commonly known as:  NORCO Take 1-2 tablets by mouth every 6 (six) hours as needed for severe pain (pain score 7-10).   Iron 28 MG Tabs Take 28 mg by mouth at bedtime.   levothyroxine 50 MCG tablet Commonly known as:  SYNTHROID, LEVOTHROID Take 50 mcg by mouth daily before breakfast.   magnesium oxide 400 MG tablet Commonly known as:  MAG-OX Take 400 mg by mouth daily.   methocarbamol 500 MG tablet Commonly known as:  ROBAXIN Take 1 tablet (500 mg total) by mouth every 6 (six) hours as needed for muscle spasms.   multivitamin with minerals tablet Take 1 tablet by mouth daily.   niacin 500 MG tablet Take 500 mg by mouth daily.   polyethylene glycol  packet Commonly known as:  MIRALAX / GLYCOLAX Take 17 g by mouth daily as needed for mild constipation.   PROBIOTIC DAILY PO Take 1 capsule by mouth daily.   vitamin B-12 1000 MCG tablet Commonly known as:  CYANOCOBALAMIN Take 1,000 mcg by mouth every other day.      Follow-up Information    Susa Day, MD.   Specialty:  Orthopedic Surgery Contact information: [address] [place] [address] [phone]           Signed: Lacie Draft, PA-C Orthopaedic Surgery 03/14/2018, 8:33 AM

## 2018-03-18 DIAGNOSIS — M179 Osteoarthritis of knee, unspecified: Secondary | ICD-10-CM | POA: Diagnosis not present

## 2018-03-18 DIAGNOSIS — Z96652 Presence of left artificial knee joint: Secondary | ICD-10-CM | POA: Diagnosis not present

## 2018-03-20 DIAGNOSIS — M179 Osteoarthritis of knee, unspecified: Secondary | ICD-10-CM | POA: Diagnosis not present

## 2018-03-20 DIAGNOSIS — Z96652 Presence of left artificial knee joint: Secondary | ICD-10-CM | POA: Diagnosis not present

## 2018-03-24 DIAGNOSIS — Z96652 Presence of left artificial knee joint: Secondary | ICD-10-CM | POA: Diagnosis not present

## 2018-03-24 DIAGNOSIS — M179 Osteoarthritis of knee, unspecified: Secondary | ICD-10-CM | POA: Diagnosis not present

## 2018-03-26 DIAGNOSIS — M25562 Pain in left knee: Secondary | ICD-10-CM | POA: Diagnosis not present

## 2018-03-26 DIAGNOSIS — M179 Osteoarthritis of knee, unspecified: Secondary | ICD-10-CM | POA: Diagnosis not present

## 2018-03-26 DIAGNOSIS — M25561 Pain in right knee: Secondary | ICD-10-CM | POA: Diagnosis not present

## 2018-03-26 DIAGNOSIS — Z4789 Encounter for other orthopedic aftercare: Secondary | ICD-10-CM | POA: Diagnosis not present

## 2018-03-26 DIAGNOSIS — Z96652 Presence of left artificial knee joint: Secondary | ICD-10-CM | POA: Diagnosis not present

## 2018-03-28 DIAGNOSIS — M179 Osteoarthritis of knee, unspecified: Secondary | ICD-10-CM | POA: Diagnosis not present

## 2018-03-28 DIAGNOSIS — Z96652 Presence of left artificial knee joint: Secondary | ICD-10-CM | POA: Diagnosis not present

## 2018-03-31 DIAGNOSIS — M179 Osteoarthritis of knee, unspecified: Secondary | ICD-10-CM | POA: Diagnosis not present

## 2018-03-31 DIAGNOSIS — Z96652 Presence of left artificial knee joint: Secondary | ICD-10-CM | POA: Diagnosis not present

## 2018-04-02 DIAGNOSIS — Z96652 Presence of left artificial knee joint: Secondary | ICD-10-CM | POA: Diagnosis not present

## 2018-04-02 DIAGNOSIS — M179 Osteoarthritis of knee, unspecified: Secondary | ICD-10-CM | POA: Diagnosis not present

## 2018-04-04 DIAGNOSIS — M179 Osteoarthritis of knee, unspecified: Secondary | ICD-10-CM | POA: Diagnosis not present

## 2018-04-04 DIAGNOSIS — Z96652 Presence of left artificial knee joint: Secondary | ICD-10-CM | POA: Diagnosis not present

## 2018-04-07 DIAGNOSIS — M179 Osteoarthritis of knee, unspecified: Secondary | ICD-10-CM | POA: Diagnosis not present

## 2018-04-07 DIAGNOSIS — Z96652 Presence of left artificial knee joint: Secondary | ICD-10-CM | POA: Diagnosis not present

## 2018-04-09 DIAGNOSIS — Z96652 Presence of left artificial knee joint: Secondary | ICD-10-CM | POA: Diagnosis not present

## 2018-04-09 DIAGNOSIS — M179 Osteoarthritis of knee, unspecified: Secondary | ICD-10-CM | POA: Diagnosis not present

## 2018-04-10 DIAGNOSIS — F3110 Bipolar disorder, current episode manic without psychotic features, unspecified: Secondary | ICD-10-CM | POA: Diagnosis not present

## 2018-04-11 DIAGNOSIS — M179 Osteoarthritis of knee, unspecified: Secondary | ICD-10-CM | POA: Diagnosis not present

## 2018-04-11 DIAGNOSIS — Z96652 Presence of left artificial knee joint: Secondary | ICD-10-CM | POA: Diagnosis not present

## 2018-04-16 DIAGNOSIS — E039 Hypothyroidism, unspecified: Secondary | ICD-10-CM | POA: Diagnosis not present

## 2018-04-16 DIAGNOSIS — M810 Age-related osteoporosis without current pathological fracture: Secondary | ICD-10-CM | POA: Diagnosis not present

## 2018-04-16 DIAGNOSIS — R600 Localized edema: Secondary | ICD-10-CM | POA: Diagnosis not present

## 2018-04-16 DIAGNOSIS — F319 Bipolar disorder, unspecified: Secondary | ICD-10-CM | POA: Diagnosis not present

## 2018-04-16 DIAGNOSIS — E785 Hyperlipidemia, unspecified: Secondary | ICD-10-CM | POA: Diagnosis not present

## 2018-04-16 DIAGNOSIS — E119 Type 2 diabetes mellitus without complications: Secondary | ICD-10-CM | POA: Diagnosis not present

## 2018-04-18 DIAGNOSIS — Z96652 Presence of left artificial knee joint: Secondary | ICD-10-CM | POA: Diagnosis not present

## 2018-04-18 DIAGNOSIS — M179 Osteoarthritis of knee, unspecified: Secondary | ICD-10-CM | POA: Diagnosis not present

## 2018-04-23 DIAGNOSIS — Z96652 Presence of left artificial knee joint: Secondary | ICD-10-CM | POA: Diagnosis not present

## 2018-04-23 DIAGNOSIS — Z4889 Encounter for other specified surgical aftercare: Secondary | ICD-10-CM | POA: Diagnosis not present

## 2018-04-23 DIAGNOSIS — M179 Osteoarthritis of knee, unspecified: Secondary | ICD-10-CM | POA: Diagnosis not present

## 2018-04-23 DIAGNOSIS — Z471 Aftercare following joint replacement surgery: Secondary | ICD-10-CM | POA: Diagnosis not present

## 2018-04-29 DIAGNOSIS — Z96652 Presence of left artificial knee joint: Secondary | ICD-10-CM | POA: Diagnosis not present

## 2018-04-29 DIAGNOSIS — M179 Osteoarthritis of knee, unspecified: Secondary | ICD-10-CM | POA: Diagnosis not present

## 2018-05-06 DIAGNOSIS — Z96652 Presence of left artificial knee joint: Secondary | ICD-10-CM | POA: Diagnosis not present

## 2018-05-06 DIAGNOSIS — M179 Osteoarthritis of knee, unspecified: Secondary | ICD-10-CM | POA: Diagnosis not present

## 2018-05-08 DIAGNOSIS — D2239 Melanocytic nevi of other parts of face: Secondary | ICD-10-CM | POA: Diagnosis not present

## 2018-05-08 DIAGNOSIS — L821 Other seborrheic keratosis: Secondary | ICD-10-CM | POA: Diagnosis not present

## 2018-05-08 DIAGNOSIS — L570 Actinic keratosis: Secondary | ICD-10-CM | POA: Diagnosis not present

## 2018-05-14 DIAGNOSIS — Z96652 Presence of left artificial knee joint: Secondary | ICD-10-CM | POA: Diagnosis not present

## 2018-05-14 DIAGNOSIS — M179 Osteoarthritis of knee, unspecified: Secondary | ICD-10-CM | POA: Diagnosis not present

## 2018-07-10 DIAGNOSIS — F3110 Bipolar disorder, current episode manic without psychotic features, unspecified: Secondary | ICD-10-CM | POA: Diagnosis not present

## 2018-10-01 DIAGNOSIS — M1712 Unilateral primary osteoarthritis, left knee: Secondary | ICD-10-CM | POA: Diagnosis not present

## 2018-10-01 DIAGNOSIS — M1711 Unilateral primary osteoarthritis, right knee: Secondary | ICD-10-CM | POA: Diagnosis not present

## 2018-10-01 DIAGNOSIS — Z96651 Presence of right artificial knee joint: Secondary | ICD-10-CM | POA: Diagnosis not present

## 2018-10-20 DIAGNOSIS — M419 Scoliosis, unspecified: Secondary | ICD-10-CM | POA: Diagnosis not present

## 2018-10-20 DIAGNOSIS — G479 Sleep disorder, unspecified: Secondary | ICD-10-CM | POA: Diagnosis not present

## 2018-10-20 DIAGNOSIS — F319 Bipolar disorder, unspecified: Secondary | ICD-10-CM | POA: Diagnosis not present

## 2018-10-20 DIAGNOSIS — E039 Hypothyroidism, unspecified: Secondary | ICD-10-CM | POA: Diagnosis not present

## 2018-10-20 DIAGNOSIS — G252 Other specified forms of tremor: Secondary | ICD-10-CM | POA: Diagnosis not present

## 2018-10-20 DIAGNOSIS — M810 Age-related osteoporosis without current pathological fracture: Secondary | ICD-10-CM | POA: Diagnosis not present

## 2018-10-20 DIAGNOSIS — R609 Edema, unspecified: Secondary | ICD-10-CM | POA: Diagnosis not present

## 2018-10-20 DIAGNOSIS — E119 Type 2 diabetes mellitus without complications: Secondary | ICD-10-CM | POA: Diagnosis not present

## 2018-10-20 DIAGNOSIS — Z1239 Encounter for other screening for malignant neoplasm of breast: Secondary | ICD-10-CM | POA: Diagnosis not present

## 2018-10-20 DIAGNOSIS — E785 Hyperlipidemia, unspecified: Secondary | ICD-10-CM | POA: Diagnosis not present

## 2018-10-23 DIAGNOSIS — F3110 Bipolar disorder, current episode manic without psychotic features, unspecified: Secondary | ICD-10-CM | POA: Diagnosis not present

## 2018-12-08 DIAGNOSIS — M47816 Spondylosis without myelopathy or radiculopathy, lumbar region: Secondary | ICD-10-CM | POA: Diagnosis not present

## 2018-12-08 DIAGNOSIS — Z6827 Body mass index (BMI) 27.0-27.9, adult: Secondary | ICD-10-CM | POA: Diagnosis not present

## 2018-12-29 DIAGNOSIS — M47816 Spondylosis without myelopathy or radiculopathy, lumbar region: Secondary | ICD-10-CM | POA: Diagnosis not present

## 2019-01-15 DIAGNOSIS — F3110 Bipolar disorder, current episode manic without psychotic features, unspecified: Secondary | ICD-10-CM | POA: Diagnosis not present

## 2019-01-22 DIAGNOSIS — M47816 Spondylosis without myelopathy or radiculopathy, lumbar region: Secondary | ICD-10-CM | POA: Diagnosis not present

## 2019-02-10 DIAGNOSIS — M47816 Spondylosis without myelopathy or radiculopathy, lumbar region: Secondary | ICD-10-CM | POA: Diagnosis not present

## 2019-03-10 DIAGNOSIS — M47816 Spondylosis without myelopathy or radiculopathy, lumbar region: Secondary | ICD-10-CM | POA: Diagnosis not present

## 2019-03-19 DIAGNOSIS — M47816 Spondylosis without myelopathy or radiculopathy, lumbar region: Secondary | ICD-10-CM | POA: Diagnosis not present

## 2019-04-09 DIAGNOSIS — F3110 Bipolar disorder, current episode manic without psychotic features, unspecified: Secondary | ICD-10-CM | POA: Diagnosis not present

## 2019-04-20 DIAGNOSIS — E785 Hyperlipidemia, unspecified: Secondary | ICD-10-CM | POA: Diagnosis not present

## 2019-04-20 DIAGNOSIS — E119 Type 2 diabetes mellitus without complications: Secondary | ICD-10-CM | POA: Diagnosis not present

## 2019-04-20 DIAGNOSIS — K219 Gastro-esophageal reflux disease without esophagitis: Secondary | ICD-10-CM | POA: Diagnosis not present

## 2019-04-20 DIAGNOSIS — E039 Hypothyroidism, unspecified: Secondary | ICD-10-CM | POA: Diagnosis not present

## 2019-04-20 DIAGNOSIS — M810 Age-related osteoporosis without current pathological fracture: Secondary | ICD-10-CM | POA: Diagnosis not present

## 2019-04-20 DIAGNOSIS — Z0000 Encounter for general adult medical examination without abnormal findings: Secondary | ICD-10-CM | POA: Diagnosis not present

## 2019-04-20 DIAGNOSIS — R600 Localized edema: Secondary | ICD-10-CM | POA: Diagnosis not present

## 2019-04-20 DIAGNOSIS — M159 Polyosteoarthritis, unspecified: Secondary | ICD-10-CM | POA: Diagnosis not present

## 2019-04-20 DIAGNOSIS — Z1239 Encounter for other screening for malignant neoplasm of breast: Secondary | ICD-10-CM | POA: Diagnosis not present

## 2019-04-20 DIAGNOSIS — M419 Scoliosis, unspecified: Secondary | ICD-10-CM | POA: Diagnosis not present

## 2019-04-21 DIAGNOSIS — K219 Gastro-esophageal reflux disease without esophagitis: Secondary | ICD-10-CM | POA: Diagnosis not present

## 2019-04-21 DIAGNOSIS — E785 Hyperlipidemia, unspecified: Secondary | ICD-10-CM | POA: Diagnosis not present

## 2019-04-21 DIAGNOSIS — E039 Hypothyroidism, unspecified: Secondary | ICD-10-CM | POA: Diagnosis not present

## 2019-04-21 DIAGNOSIS — E119 Type 2 diabetes mellitus without complications: Secondary | ICD-10-CM | POA: Diagnosis not present

## 2019-04-22 ENCOUNTER — Other Ambulatory Visit: Payer: Self-pay | Admitting: Physician Assistant

## 2019-04-22 DIAGNOSIS — M810 Age-related osteoporosis without current pathological fracture: Secondary | ICD-10-CM

## 2019-07-09 DIAGNOSIS — F3110 Bipolar disorder, current episode manic without psychotic features, unspecified: Secondary | ICD-10-CM | POA: Diagnosis not present

## 2019-07-13 DIAGNOSIS — F3110 Bipolar disorder, current episode manic without psychotic features, unspecified: Secondary | ICD-10-CM | POA: Diagnosis not present

## 2019-08-12 DIAGNOSIS — F3110 Bipolar disorder, current episode manic without psychotic features, unspecified: Secondary | ICD-10-CM | POA: Diagnosis not present

## 2019-08-15 IMAGING — DX DG KNEE 1-2V*R*
3 series · 3 of 3 positions shown · non-contrast
Comparison: None.

CLINICAL DATA: Status post right total knee replacement

EXAM:
RIGHT KNEE - 1-2 VIEW

[knee ap (1 of 2)]
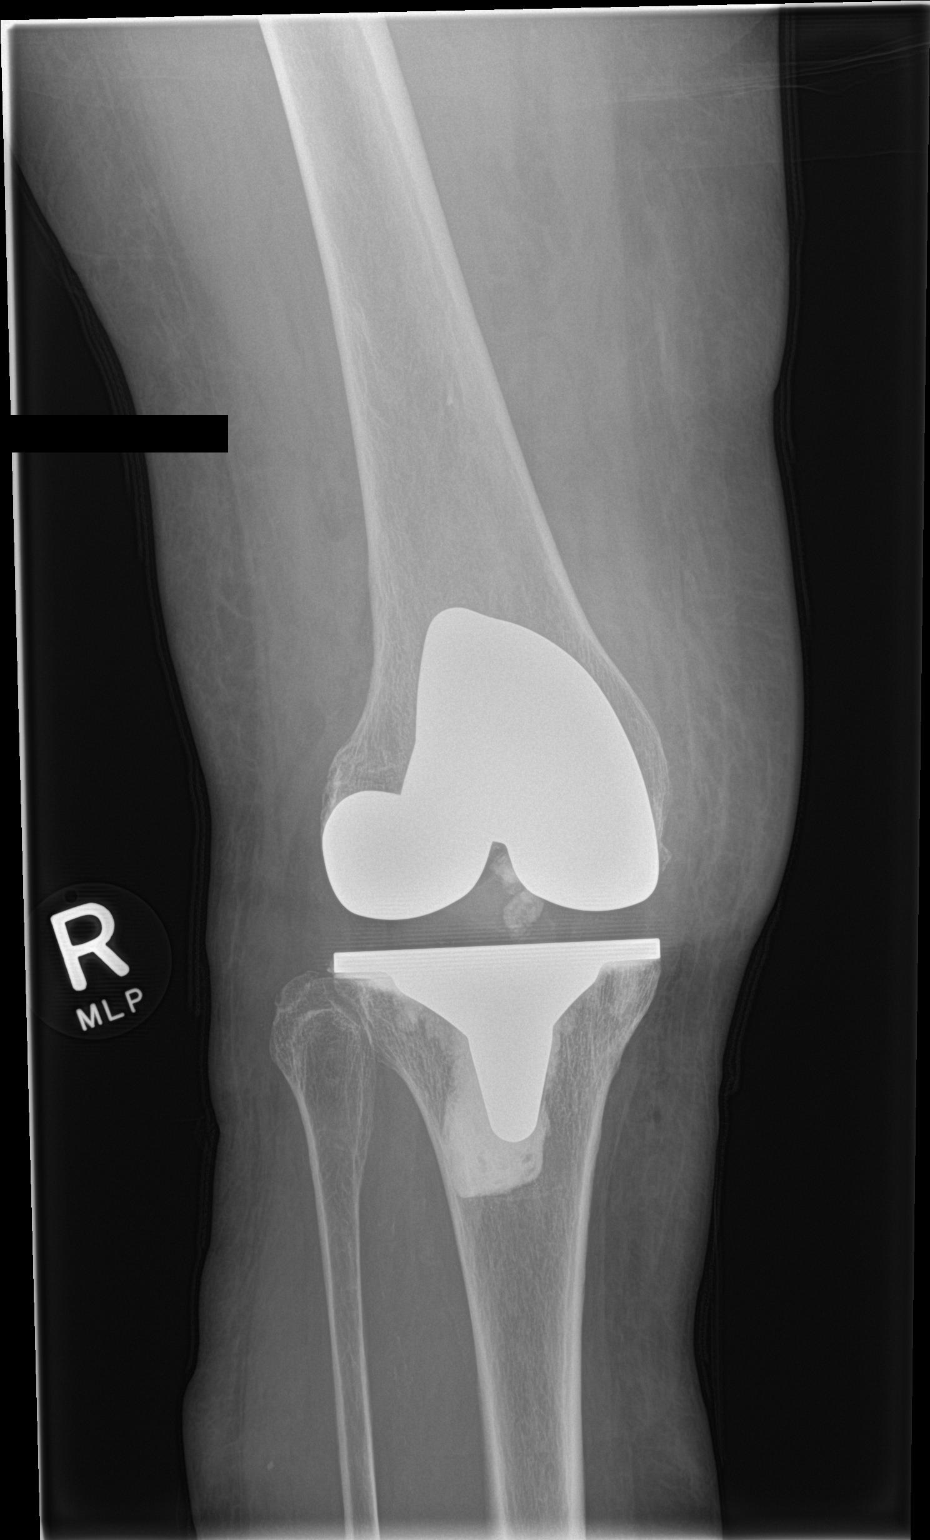

[knee lat]
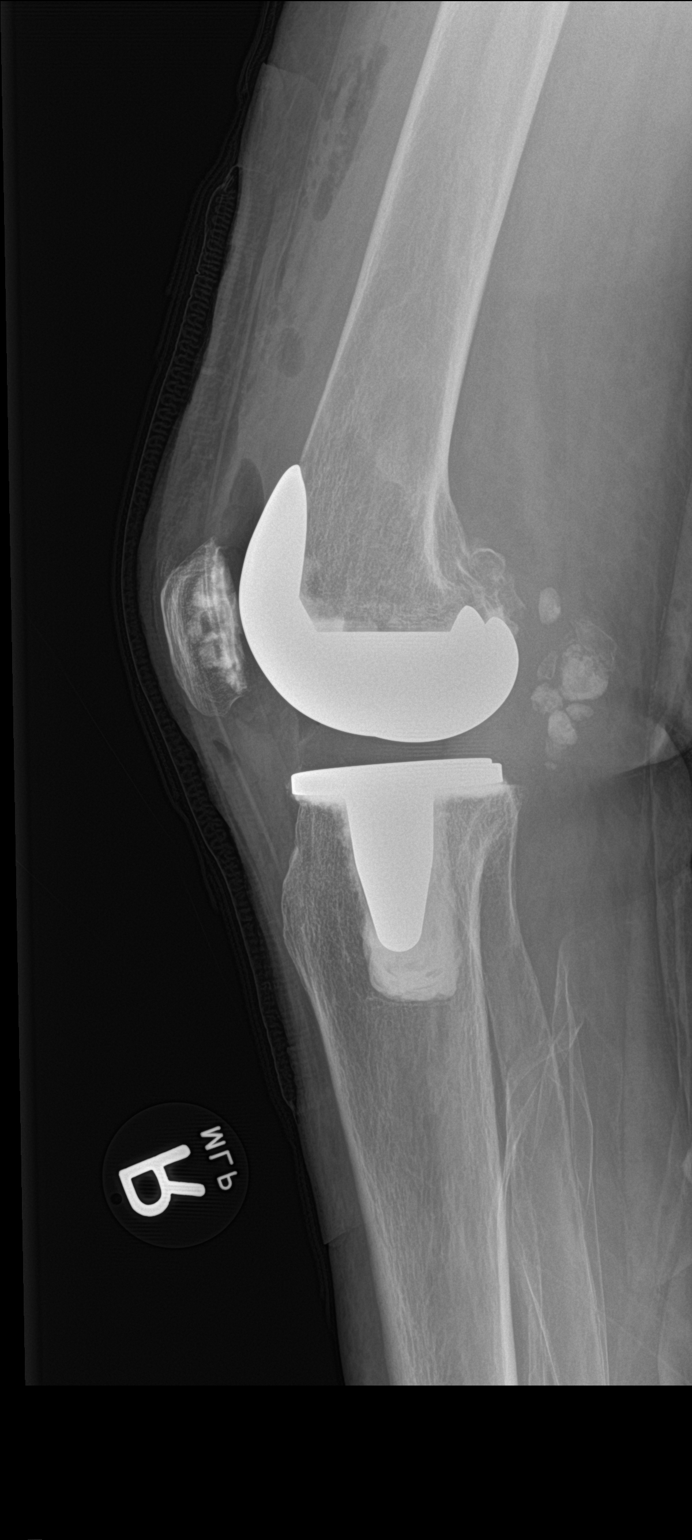

[knee ap (2 of 2)]
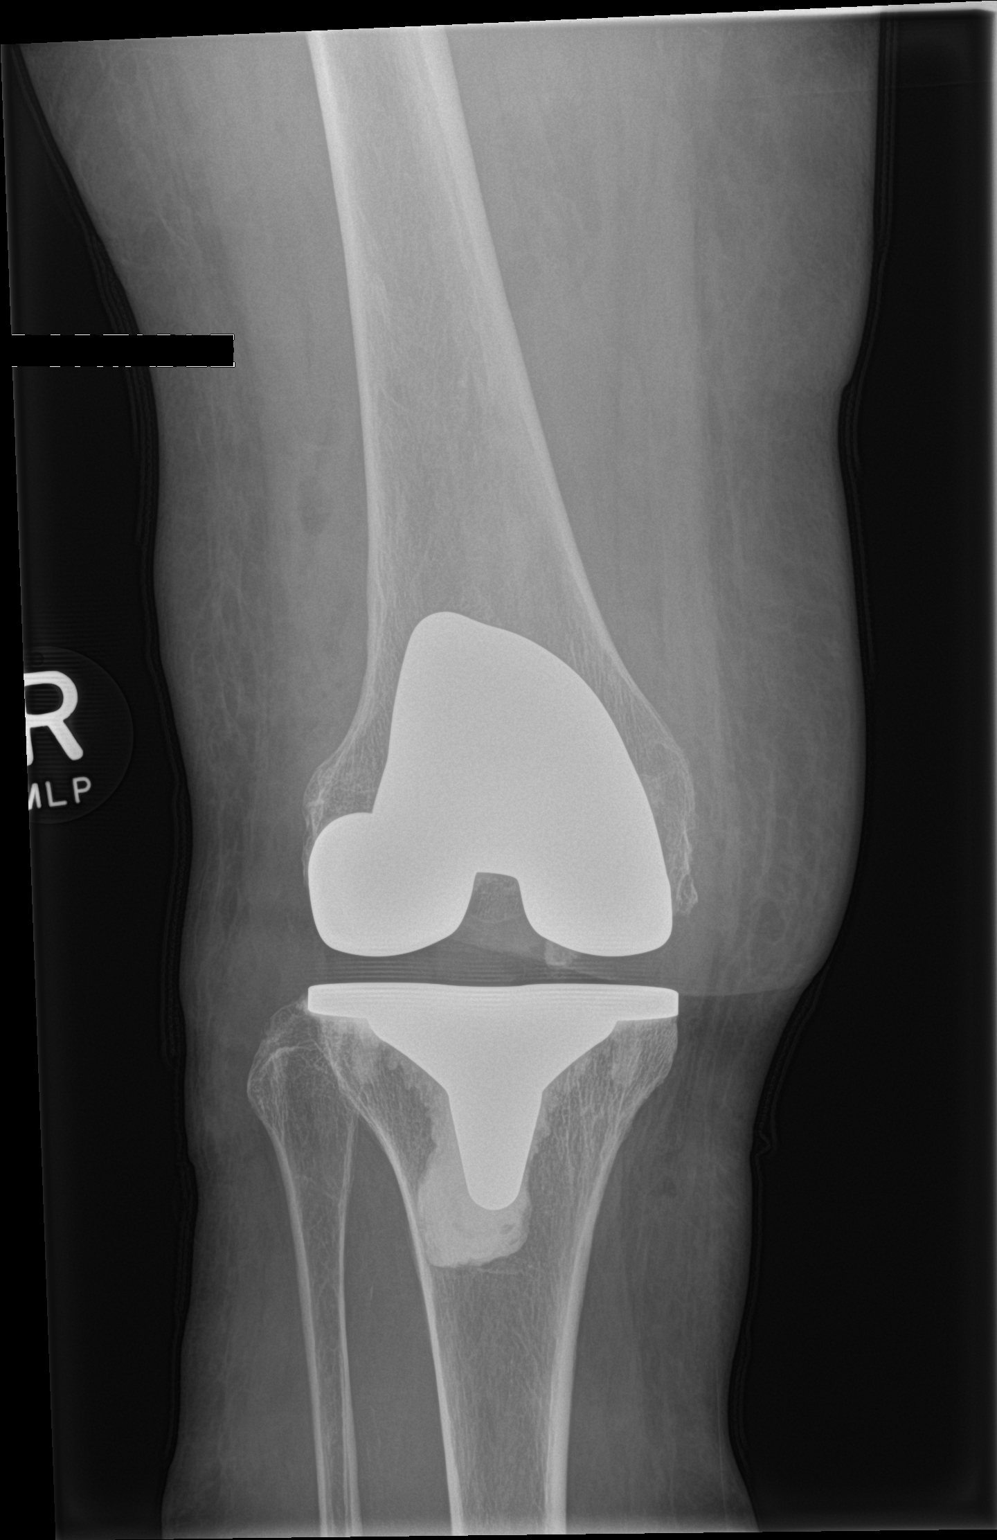

[3 of 3 positions shown; findings below may reference images not displayed]

FINDINGS: The right knee demonstrates a total knee arthroplasty without
evidence of hardware failure complication. There is no significant
joint effusion. There is no fracture or dislocation. The alignment
is anatomic. Post-surgical changes noted in the surrounding soft
tissues. Multiple ossified loose bodies in the posterior joint
space.
IMPRESSION: 1. Interval right total knee arthroplasty.
2. Multiple loose bodies in the posterior joint space.

## 2019-09-12 ENCOUNTER — Emergency Department (HOSPITAL_COMMUNITY): Payer: Medicare Other

## 2019-09-12 ENCOUNTER — Other Ambulatory Visit: Payer: Self-pay

## 2019-09-12 ENCOUNTER — Encounter (HOSPITAL_COMMUNITY): Payer: Self-pay

## 2019-09-12 ENCOUNTER — Inpatient Hospital Stay (HOSPITAL_COMMUNITY)
Admission: EM | Admit: 2019-09-12 | Discharge: 2019-09-15 | DRG: 378 | Disposition: A | Discharge status: Home / Self Care | Payer: Medicare Other | Attending: Family Medicine | Admitting: Family Medicine

## 2019-09-12 DIAGNOSIS — Z79899 Other long term (current) drug therapy: Secondary | ICD-10-CM

## 2019-09-12 DIAGNOSIS — D649 Anemia, unspecified: Secondary | ICD-10-CM | POA: Diagnosis present

## 2019-09-12 DIAGNOSIS — Z96653 Presence of artificial knee joint, bilateral: Secondary | ICD-10-CM | POA: Diagnosis present

## 2019-09-12 DIAGNOSIS — F319 Bipolar disorder, unspecified: Secondary | ICD-10-CM | POA: Diagnosis present

## 2019-09-12 DIAGNOSIS — Z96611 Presence of right artificial shoulder joint: Secondary | ICD-10-CM | POA: Diagnosis present

## 2019-09-12 DIAGNOSIS — T39395A Adverse effect of other nonsteroidal anti-inflammatory drugs [NSAID], initial encounter: Secondary | ICD-10-CM | POA: Diagnosis present

## 2019-09-12 DIAGNOSIS — Z87891 Personal history of nicotine dependence: Secondary | ICD-10-CM

## 2019-09-12 DIAGNOSIS — K649 Unspecified hemorrhoids: Secondary | ICD-10-CM | POA: Diagnosis not present

## 2019-09-12 DIAGNOSIS — M1990 Unspecified osteoarthritis, unspecified site: Secondary | ICD-10-CM | POA: Diagnosis present

## 2019-09-12 DIAGNOSIS — K5791 Diverticulosis of intestine, part unspecified, without perforation or abscess with bleeding: Secondary | ICD-10-CM | POA: Diagnosis not present

## 2019-09-12 DIAGNOSIS — K289 Gastrojejunal ulcer, unspecified as acute or chronic, without hemorrhage or perforation: Secondary | ICD-10-CM | POA: Diagnosis present

## 2019-09-12 DIAGNOSIS — D7589 Other specified diseases of blood and blood-forming organs: Secondary | ICD-10-CM | POA: Diagnosis present

## 2019-09-12 DIAGNOSIS — E785 Hyperlipidemia, unspecified: Secondary | ICD-10-CM | POA: Diagnosis present

## 2019-09-12 DIAGNOSIS — K640 First degree hemorrhoids: Secondary | ICD-10-CM | POA: Diagnosis present

## 2019-09-12 DIAGNOSIS — M810 Age-related osteoporosis without current pathological fracture: Secondary | ICD-10-CM | POA: Diagnosis present

## 2019-09-12 DIAGNOSIS — Z800 Family history of malignant neoplasm of digestive organs: Secondary | ICD-10-CM

## 2019-09-12 DIAGNOSIS — M48061 Spinal stenosis, lumbar region without neurogenic claudication: Secondary | ICD-10-CM | POA: Diagnosis not present

## 2019-09-12 DIAGNOSIS — R0600 Dyspnea, unspecified: Secondary | ICD-10-CM | POA: Diagnosis not present

## 2019-09-12 DIAGNOSIS — Z7982 Long term (current) use of aspirin: Secondary | ICD-10-CM

## 2019-09-12 DIAGNOSIS — M419 Scoliosis, unspecified: Secondary | ICD-10-CM | POA: Diagnosis present

## 2019-09-12 DIAGNOSIS — D62 Acute posthemorrhagic anemia: Secondary | ICD-10-CM | POA: Diagnosis not present

## 2019-09-12 DIAGNOSIS — K5730 Diverticulosis of large intestine without perforation or abscess without bleeding: Secondary | ICD-10-CM | POA: Diagnosis present

## 2019-09-12 DIAGNOSIS — F419 Anxiety disorder, unspecified: Secondary | ICD-10-CM | POA: Diagnosis present

## 2019-09-12 DIAGNOSIS — Z8719 Personal history of other diseases of the digestive system: Secondary | ICD-10-CM

## 2019-09-12 DIAGNOSIS — Z79891 Long term (current) use of opiate analgesic: Secondary | ICD-10-CM

## 2019-09-12 DIAGNOSIS — E039 Hypothyroidism, unspecified: Secondary | ICD-10-CM | POA: Diagnosis present

## 2019-09-12 DIAGNOSIS — E119 Type 2 diabetes mellitus without complications: Secondary | ICD-10-CM | POA: Diagnosis present

## 2019-09-12 DIAGNOSIS — R0602 Shortness of breath: Secondary | ICD-10-CM | POA: Diagnosis not present

## 2019-09-12 DIAGNOSIS — K922 Gastrointestinal hemorrhage, unspecified: Secondary | ICD-10-CM | POA: Diagnosis not present

## 2019-09-12 DIAGNOSIS — Z20828 Contact with and (suspected) exposure to other viral communicable diseases: Secondary | ICD-10-CM | POA: Diagnosis present

## 2019-09-12 DIAGNOSIS — K5731 Diverticulosis of large intestine without perforation or abscess with bleeding: Secondary | ICD-10-CM | POA: Diagnosis present

## 2019-09-12 DIAGNOSIS — M51369 Other intervertebral disc degeneration, lumbar region without mention of lumbar back pain or lower extremity pain: Secondary | ICD-10-CM | POA: Diagnosis present

## 2019-09-12 DIAGNOSIS — M4800 Spinal stenosis, site unspecified: Secondary | ICD-10-CM | POA: Diagnosis present

## 2019-09-12 DIAGNOSIS — Z981 Arthrodesis status: Secondary | ICD-10-CM

## 2019-09-12 DIAGNOSIS — M5136 Other intervertebral disc degeneration, lumbar region: Secondary | ICD-10-CM | POA: Diagnosis present

## 2019-09-12 DIAGNOSIS — M549 Dorsalgia, unspecified: Secondary | ICD-10-CM | POA: Diagnosis present

## 2019-09-12 DIAGNOSIS — K219 Gastro-esophageal reflux disease without esophagitis: Secondary | ICD-10-CM | POA: Diagnosis present

## 2019-09-12 DIAGNOSIS — K449 Diaphragmatic hernia without obstruction or gangrene: Secondary | ICD-10-CM | POA: Diagnosis present

## 2019-09-12 DIAGNOSIS — Z9884 Bariatric surgery status: Secondary | ICD-10-CM

## 2019-09-12 DIAGNOSIS — F909 Attention-deficit hyperactivity disorder, unspecified type: Secondary | ICD-10-CM | POA: Diagnosis present

## 2019-09-12 DIAGNOSIS — Z8619 Personal history of other infectious and parasitic diseases: Secondary | ICD-10-CM

## 2019-09-12 DIAGNOSIS — K254 Chronic or unspecified gastric ulcer with hemorrhage: Principal | ICD-10-CM | POA: Diagnosis present

## 2019-09-12 DIAGNOSIS — Z96612 Presence of left artificial shoulder joint: Secondary | ICD-10-CM | POA: Diagnosis present

## 2019-09-12 DIAGNOSIS — Z934 Other artificial openings of gastrointestinal tract status: Secondary | ICD-10-CM

## 2019-09-12 DIAGNOSIS — K921 Melena: Secondary | ICD-10-CM | POA: Diagnosis not present

## 2019-09-12 DIAGNOSIS — Z79890 Hormone replacement therapy (postmenopausal): Secondary | ICD-10-CM

## 2019-09-12 LAB — COMPREHENSIVE METABOLIC PANEL
ALT: 19 U/L (ref 0–44)
AST: 23 U/L (ref 15–41)
Albumin: 3.4 g/dL — ABNORMAL LOW (ref 3.5–5.0)
Alkaline Phosphatase: 33 U/L — ABNORMAL LOW (ref 38–126)
Anion gap: 10 (ref 5–15)
BUN: 22 mg/dL (ref 8–23)
CO2: 25 mmol/L (ref 22–32)
Calcium: 8.9 mg/dL (ref 8.9–10.3)
Chloride: 103 mmol/L (ref 98–111)
Creatinine, Ser: 0.92 mg/dL (ref 0.44–1.00)
GFR calc Af Amer: >60 mL/min (ref >60)
GFR calc non Af Amer: >60 mL/min (ref >60)
Glucose, Bld: 173 mg/dL — ABNORMAL HIGH (ref 70–99)
Potassium: 3.8 mmol/L (ref 3.5–5.1)
Sodium: 138 mmol/L (ref 135–145)
Total Bilirubin: 0.5 mg/dL (ref 0.3–1.2)
Total Protein: 5.5 g/dL — ABNORMAL LOW (ref 6.5–8.1)

## 2019-09-12 LAB — CBC
HCT: 25.7 % — ABNORMAL LOW (ref 36.0–46.0)
Hemoglobin: 8.4 g/dL — ABNORMAL LOW (ref 12.0–15.0)
MCH: 32.9 pg (ref 26.0–34.0)
MCHC: 32.7 g/dL (ref 30.0–36.0)
MCV: 100.8 fL — ABNORMAL HIGH (ref 80.0–100.0)
Platelets: 187 10*3/uL (ref 150–400)
RBC: 2.55 MIL/uL — ABNORMAL LOW (ref 3.87–5.11)
RDW: 13.9 % (ref 11.5–15.5)
WBC: 7.9 10*3/uL (ref 4.0–10.5)
nRBC: 0 % (ref 0.0–0.2)

## 2019-09-12 LAB — SARS CORONAVIRUS 2 (TAT 6-24 HRS): SARS Coronavirus 2: NEGATIVE

## 2019-09-12 LAB — POC OCCULT BLOOD, ED: Fecal Occult Bld: POSITIVE — AB

## 2019-09-12 MED ORDER — IOHEXOL 300 MG/ML  SOLN
100.0000 mL | Freq: Once | INTRAMUSCULAR | Status: AC | PRN
  Administered 2019-09-12: 100 mL via INTRAVENOUS

## 2019-09-12 MED ORDER — MORPHINE SULFATE (PF) 2 MG/ML IV SOLN: 2.0000 mg | Freq: Four times a day (QID) | INTRAVENOUS | Status: DC | PRN

## 2019-09-12 MED ORDER — FERROUS SULFATE 325 (65 FE) MG PO TABS
325.0000 mg | ORAL_TABLET | Freq: Every day | ORAL | Status: DC
  Administered 2019-09-13: 325 mg via ORAL
  Filled 2019-09-12: qty 1

## 2019-09-12 MED ORDER — ONDANSETRON HCL 4 MG/2ML IJ SOLN: 4.0000 mg | Freq: Four times a day (QID) | INTRAMUSCULAR | Status: DC | PRN

## 2019-09-12 MED ORDER — AMPHETAMINE-DEXTROAMPHETAMINE 10 MG PO TABS
30.0000 mg | ORAL_TABLET | Freq: Two times a day (BID) | ORAL | Status: DC
  Filled 2019-09-12 (×2): qty 3

## 2019-09-12 MED ORDER — DIVALPROEX SODIUM ER 500 MG PO TB24
1000.0000 mg | ORAL_TABLET | Freq: Every day | ORAL | Status: DC
  Administered 2019-09-13 – 2019-09-15 (×2): 1000 mg via ORAL
  Filled 2019-09-12 (×4): qty 2

## 2019-09-12 MED ORDER — BUPROPION HCL ER (XL) 150 MG PO TB24: 300.0000 mg | ORAL_TABLET | Freq: Every day | ORAL | Status: DC

## 2019-09-12 MED ORDER — BUPROPION HCL ER (XL) 150 MG PO TB24
450.0000 mg | ORAL_TABLET | Freq: Every day | ORAL | Status: DC
  Administered 2019-09-13 – 2019-09-15 (×2): 450 mg via ORAL
  Filled 2019-09-12 (×3): qty 3

## 2019-09-12 MED ORDER — LEVOTHYROXINE SODIUM 50 MCG PO TABS
50.0000 ug | ORAL_TABLET | Freq: Every day | ORAL | Status: DC
  Administered 2019-09-13 – 2019-09-15 (×3): 50 ug via ORAL
  Filled 2019-09-12 (×3): qty 1

## 2019-09-12 MED ORDER — ACETAMINOPHEN 650 MG RE SUPP: 650.0000 mg | Freq: Four times a day (QID) | RECTAL | Status: DC | PRN

## 2019-09-12 MED ORDER — TRAZODONE HCL 100 MG PO TABS
100.0000 mg | ORAL_TABLET | Freq: Every evening | ORAL | Status: DC | PRN
  Administered 2019-09-13: 100 mg via ORAL
  Filled 2019-09-12: qty 1

## 2019-09-12 MED ORDER — SODIUM CHLORIDE 0.9 % IV SOLN
INTRAVENOUS | Status: DC
  Administered 2019-09-13: 01:00:00 via INTRAVENOUS

## 2019-09-12 MED ORDER — ALPRAZOLAM 0.5 MG PO TABS
0.5000 mg | ORAL_TABLET | Freq: Three times a day (TID) | ORAL | Status: DC | PRN
  Administered 2019-09-13 – 2019-09-15 (×4): 0.5 mg via ORAL
  Filled 2019-09-12 (×5): qty 1

## 2019-09-12 MED ORDER — ACETAMINOPHEN 325 MG PO TABS
650.0000 mg | ORAL_TABLET | Freq: Four times a day (QID) | ORAL | Status: DC | PRN
  Administered 2019-09-13: 650 mg via ORAL
  Filled 2019-09-12: qty 2

## 2019-09-12 MED ORDER — PANTOPRAZOLE SODIUM 40 MG IV SOLR
40.0000 mg | Freq: Two times a day (BID) | INTRAVENOUS | Status: DC
  Administered 2019-09-13 – 2019-09-15 (×6): 40 mg via INTRAVENOUS
  Filled 2019-09-12 (×6): qty 40

## 2019-09-12 MED ORDER — PRAVASTATIN SODIUM 10 MG PO TABS
10.0000 mg | ORAL_TABLET | Freq: Every day | ORAL | Status: DC
  Administered 2019-09-13 (×2): 10 mg via ORAL
  Filled 2019-09-12 (×3): qty 1

## 2019-09-12 MED ORDER — ONDANSETRON HCL 4 MG PO TABS: 4.0000 mg | ORAL_TABLET | Freq: Four times a day (QID) | ORAL | Status: DC | PRN

## 2019-09-12 NOTE — ED Triage Notes (Signed)
Pt reports chills, sob and dizziness on exertion for the past 3 days and blood in her stool since yesterday. Pt states she has chronic pain all over. Pt a/o, nad noted

## 2019-09-12 NOTE — ED Notes (Signed)
Husband going home

## 2019-09-12 NOTE — ED Provider Notes (Signed)
[facility] EMERGENCY DEPARTMENT Provider Note   CSN: [id number] Arrival date & time: 09/12/19  1515     History   Chief Complaint Chief Complaint  Patient presents with  . Shortness of Breath  . Chills  . Rectal Bleeding    HPI Alexandra Morrison is a 73 y.o. female with PMH significant for myelopathy, spinal stenosis, osteopenia, bipolar d/o, scoliosis, anxiety, lumbar DDD, obesity s/p bariatric surgery 2006 who presents for 3 day h/o SOB and lightheadedness on exertion. Noticed bright red blood per rectum since yesterday with loose BMs, denies dark tarry stools. Did vomit once earlier this week but denies any abdominal pain. Had low BP this am, 87/57 on home monitor which prompted her to come in.  Last colonoscopy 3-4 years ago, noted diverticulosis and had benign polyps removed. She is not on any blood thinners. She has been taking more Aleve than usual this past week as she has had increased back pain from her scoliosis and DDD. She has been taking Aleve every 6 hours scheduled. She denies any falls or head trauma from her lightheadedness. She has noted slight congestion, rhinorrhea but attributes this to her allergies. Denies fevers, cough, sick contacts. BMs normally daily, alternates loose with compact. Normally takes iron tablets daily but has not taken any the past few days.     Past Medical History:  Diagnosis Date  . Abdominal aortic atherosclerosis (HCC)    Moderate  . Anxiety   . Arthritis   . Bipolar disorder ([place])    type 2 hypomanic  . Complication of anesthesia    pt request no spinal anesthesia only general  . DDD (degenerative disc disease), lumbar   . High cholesterol   . History of diabetes mellitus    resolved after weight loss  . History of mumps as a child   . History of sleep apnea    most recent study was negative for sleep apnea, resolved after weight loss   . Hypothyroidism   . Myelopathy ([place])   . Numbness and tingling in hands    . Osteopenia   . Osteoporosis   . Right arm fracture 1953   x2  . Scoliosis    per pt  . Spinal stenosis    Moderate to severe  . Vertigo   . Wears glasses     Patient Active Problem List   Diagnosis Date Noted  . Left knee DJD 03/12/2018  . Primary osteoarthritis of right knee 07/11/2017  . Right knee DJD 07/11/2017  . S/P cervical spinal fusion 11/25/2015    Past Surgical History:  Procedure Laterality Date  . APPENDECTOMY    . CARPAL TUNNEL RELEASE Bilateral   . [place]   x2  . EYE SURGERY Bilateral 2006   cataracts  . FRACTURE SURGERY     Arm  . GASTRIC BYPASS  2006  . POSTERIOR CERVICAL FUSION/FORAMINOTOMY N/A 11/25/2015   Procedure: Posterior Cervical Fusion with lateral mass fixation Cervical fiv-Thoracic one, cervical laminectomy  - Cervical six-Cervical seven - Cervical seven-Thoracic one;  Surgeon: Eustace Moore, MD;  Location: [facility];  Service: Neurosurgery;  Laterality: N/A;  . TOTAL KNEE ARTHROPLASTY Right 07/11/2017   Procedure: RIGHT TOTAL KNEE ARTHROPLASTY;  Surgeon: Susa Day, MD;  Location: [facility];  Service: Orthopedics;  Laterality: Right;  150 mins  . TOTAL KNEE ARTHROPLASTY Left 03/12/2018   Procedure: LEFT TOTAL KNEE ARTHROPLASTY;  Surgeon: Susa Day, MD;  Location: [facility];  Service: Orthopedics;  Laterality: Left;  Adductor Block  . TOTAL SHOULDER ARTHROPLASTY Bilateral 2010, 2011     OB History   No obstetric history on file.      Home Medications    Prior to Admission medications   Medication Sig Start Date End Date Taking? Authorizing Provider  acetaminophen (TYLENOL) 500 MG tablet Take 1,000 mg by mouth See admin instructions. Take two tablets (1000 mg) by mouth every morning, may also take two tablets (1000 mg) every 6 hours as needed for pain   Yes [provider]  alendronate (FOSAMAX) 70 MG tablet Take 70 mg by mouth every Monday. Take with a full glass of water on an empty stomach.   Yes  [provider]  ALPRAZolam Duanne Moron) 0.5 MG tablet Take 0.5 mg by mouth 3 (three) times daily as needed for anxiety.    Yes [provider]  amphetamine-dextroamphetamine (ADDERALL) 30 MG tablet Take 30 mg by mouth 2 (two) times daily with breakfast and lunch.    Yes [provider]  aspirin EC 81 MG tablet Take 81 mg by mouth daily with breakfast.   Yes [provider]  b complex vitamins tablet Take 1 tablet by mouth daily after supper.    Yes [provider]  Biotin 5000 MCG TABS Take 5,000 mcg by mouth daily.    Yes [provider]  buPROPion (WELLBUTRIN XL) 150 MG 24 hr tablet Take 150 mg by mouth daily with breakfast. Take with a 300 mg tablet for a total dose of 450 mg 08/04/19  Yes [provider]  buPROPion (WELLBUTRIN XL) 300 MG 24 hr tablet Take 300 mg by mouth daily with breakfast. Take with a 150 mg tablet for a total dose of 450 mg 12/30/17  Yes [provider]  calcium carbonate (OS-CAL) 600 MG tablet Take 600 mg by mouth 2 (two) times daily.   Yes [provider]  Cholecalciferol (VITAMIN D) 50 MCG (2000 UT) CAPS Take 2,000 Units by mouth daily after lunch.    Yes [provider]  Cinnamon 500 MG TABS Take 500 mg by mouth daily after supper.   Yes [provider]  Coenzyme Q10 300 MG CAPS Take 300 mg by mouth daily with lunch.   Yes [provider]  diphenhydrAMINE (BENADRYL ALLERGY) 25 MG tablet Take 25 mg by mouth 2 (two) times daily as needed for allergies.   Yes [provider]  divalproex (DEPAKOTE ER) 500 MG 24 hr tablet Take 1,000 mg by mouth daily with breakfast.  03/28/17  Yes [provider]  ferrous sulfate 325 (65 FE) MG tablet Take 325 mg by mouth at bedtime.   Yes [provider]  furosemide (LASIX) 20 MG tablet Take 20 mg by mouth 2 (two) times daily as needed for fluid or edema.    Yes [provider]  GLUCOSAMINE-CHONDROITIN PO  Take 1 capsule by mouth daily.    Yes [provider]  levothyroxine (SYNTHROID, LEVOTHROID) 50 MCG tablet Take 50 mcg by mouth daily with breakfast.    Yes [provider]  lovastatin (MEVACOR) 10 MG tablet Take 10 mg by mouth daily with supper. 07/30/19  Yes [provider]  Magnesium 500 MG TABS Take 500 mg by mouth daily after supper.   Yes [provider]  Menthol, Topical Analgesic, (BENGAY EX) Apply 1 application topically 2 (two) times daily as needed (pain).   Yes [provider]  methocarbamol (ROBAXIN) 500 MG  tablet Take 1 tablet (500 mg total) by mouth every 6 (six) hours as needed for muscle spasms. Patient taking differently: Take 500 mg by mouth 2 (two) times daily as needed for muscle spasms.  03/14/18  Yes Lacie Draft M, PA-C  Multiple Vitamins-Minerals (MULTIVITAMIN WITH MINERALS) tablet Take 1 tablet by mouth daily with lunch.    Yes [provider]  nabumetone (RELAFEN) 500 MG tablet Take 500 mg by mouth 2 (two) times daily as needed (pain).  08/04/19  Yes [provider]  niacin 500 MG tablet Take 500 mg by mouth daily after lunch.    Yes [provider]  Omega-3 Fatty Acids (FISH OIL) 1000 MG CAPS Take 1,000 mg by mouth every morning.   Yes [provider]  phenylephrine (SUDAFED PE) 10 MG TABS tablet Take 10 mg by mouth 3 times/day as needed-between meals & bedtime (sinus congestion).   Yes [provider]  Polyethyl Glycol-Propyl Glycol (SYSTANE) 0.4-0.3 % GEL ophthalmic gel Place 1 application into both eyes daily as needed (dry eyes/irritation/allergies).   Yes [provider]  Probiotic Product (PROBIOTIC DAILY PO) Take 1 capsule by mouth daily after supper.    Yes [provider]  traZODone (DESYREL) 100 MG tablet Take 100 mg by mouth at bedtime as needed for sleep.  07/09/19  Yes [provider]  Turmeric 500 MG CAPS Take 500 mg by mouth 2 (two) times  daily.   Yes [provider]  vitamin B-12 (CYANOCOBALAMIN) 1000 MCG tablet Take 1,000 mcg by mouth daily after supper.    Yes [provider]  aspirin 81 MG chewable tablet Chew 1 tablet (81 mg total) by mouth 2 (two) times daily. Patient not taking: Reported on 09/12/2019 03/14/18   Cecilie Kicks, PA-C  docusate sodium (COLACE) 100 MG capsule Take 1 capsule (100 mg total) by mouth 2 (two) times daily. Patient not taking: Reported on 09/12/2019 03/14/18   Cecilie Kicks, PA-C  HYDROcodone-acetaminophen [facility]) 7.5-325 MG tablet Take 1-2 tablets by mouth every 6 (six) hours as needed for severe pain (pain score 7-10). Patient not taking: Reported on 09/12/2019 03/14/18   Cecilie Kicks, PA-C  polyethylene glycol [facility] / GLYCOLAX) packet Take 17 g by mouth daily as needed for mild constipation. Patient not taking: Reported on 09/12/2019 03/14/18   Cecilie Kicks, PA-C    Family History No family history on file.  Social History Social History   Tobacco Use  . Smoking status: Former Smoker    Quit date: 01/20/1993    Years since quitting: 26.6  . Smokeless tobacco: Never Used  Substance Use Topics  . Alcohol use: Yes    Comment: rarely  . Drug use: No     Allergies   Patient has no known allergies.   Review of Systems Review of Systems  Constitutional: Positive for chills. Negative for appetite change and fever.  HENT: Positive for congestion and rhinorrhea. Negative for sore throat.   Respiratory: Positive for shortness of breath. Negative for cough.   Cardiovascular: Negative for chest pain, palpitations and leg swelling.  Gastrointestinal: Positive for blood in stool. Negative for abdominal pain and nausea.  Genitourinary: Negative for dysuria, frequency and vaginal bleeding.  Skin: Negative for rash.  Neurological: Positive for dizziness and light-headedness.    Physical Exam Updated Vital Signs BP 104/63   Pulse 75   Temp 97.8 F (36.6  C) (Oral)   Resp (!) 22   Ht 4\' 10"  (1.473  m)   Wt 61.7 kg   LMP  (LMP Unknown)   SpO2 100%   BMI 28.42 kg/m   Physical Exam Constitutional:      General: She is not in acute distress.    Appearance: She is not ill-appearing, toxic-appearing or diaphoretic.  HENT:     Head: Normocephalic.  Cardiovascular:     Rate and Rhythm: Normal rate and regular rhythm.     Pulses: Normal pulses.  Pulmonary:     Effort: Pulmonary effort is normal.     Breath sounds: Normal breath sounds. No decreased breath sounds, wheezing or rales.  Abdominal:     General: Bowel sounds are normal.     Palpations: Abdomen is soft.     Tenderness: There is no abdominal tenderness. There is no guarding or rebound.  Skin:    General: Skin is warm and dry.  Neurological:     Mental Status: She is alert.      ED Treatments / Results  Labs (all labs ordered are listed, but only abnormal results are displayed) Labs Reviewed  COMPREHENSIVE METABOLIC PANEL - Abnormal; Notable for the following components:      Result Value   Glucose, Bld 173 (*)    Total Protein 5.5 (*)    Albumin 3.4 (*)    Alkaline Phosphatase 33 (*)    All other components within normal limits  CBC - Abnormal; Notable for the following components:   RBC 2.55 (*)    Hemoglobin 8.4 (*)    HCT 25.7 (*)    MCV 100.8 (*)    All other components within normal limits  POC OCCULT BLOOD, ED - Abnormal; Notable for the following components:   Fecal Occult Bld POSITIVE (*)    All other components within normal limits  SARS CORONAVIRUS 2 (TAT 6-24 HRS)  TYPE AND SCREEN    EKG EKG Interpretation  Date/Time:  Saturday September 12 2019 15:40:59 EST Ventricular Rate:  89 PR Interval:  140 QRS Duration: 60 QT Interval:  366 QTC Calculation: 445 R Axis:   30 Text Interpretation: Normal sinus rhythm Normal ECG Confirmed by Elnora Morrison [id number]) on 09/12/2019 5:01:54 PM   Radiology Dg Chest 2 View  Result Date: 09/12/2019  CLINICAL DATA:  Dyspnea EXAM: CHEST - 2 VIEW COMPARISON:  11/22/2015 chest radiograph. FINDINGS: Bilateral posterior cervical spine fusion hardware. Bilateral shoulder arthroplasties. Stable cardiomediastinal silhouette with normal heart size. No pneumothorax. No pleural effusion. Lungs appear clear, with no acute consolidative airspace disease and no pulmonary edema. Long segment moderate S-shaped thoracolumbar scoliosis. IMPRESSION: No active cardiopulmonary disease. Electronically Signed   By: Ilona Sorrel M.D.   On: 09/12/2019 16:34   Ct Abdomen Pelvis W Contrast  Result Date: 09/12/2019 CLINICAL DATA:  Bright red blood per rectum. EXAM: CT ABDOMEN AND PELVIS WITH CONTRAST TECHNIQUE: Multidetector CT imaging of the abdomen and pelvis was performed using the standard protocol following bolus administration of intravenous contrast. CONTRAST:  163mL OMNIPAQUE IOHEXOL 300 MG/ML  SOLN COMPARISON:  None. FINDINGS: Lower chest: The lung bases are clear. The heart size is normal. Hepatobiliary: The liver is normal. Normal gallbladder.There is no biliary ductal dilation. Pancreas: Normal contours without ductal dilatation. No peripancreatic fluid collection. Spleen: No splenic laceration or hematoma. Adrenals/Urinary Tract: --Adrenal glands: No adrenal hemorrhage. --Right kidney/ureter: No hydronephrosis or perinephric hematoma. --Left kidney/ureter: No hydronephrosis or perinephric hematoma. --Urinary bladder: Unremarkable. Stomach/Bowel: --Stomach/Duodenum: The patient is status post prior gastric bypass. There is a moderate-sized hiatal hernia. --Small  bowel: No dilatation or inflammation. --Colon: No focal abnormality. --Appendix: Not visualized. No right lower quadrant inflammation or free fluid. Vascular/Lymphatic: Atherosclerotic calcification is present within the non-aneurysmal abdominal aorta, without hemodynamically significant stenosis. --No retroperitoneal lymphadenopathy. --No mesenteric  lymphadenopathy. --No pelvic or inguinal lymphadenopathy. Reproductive: Unremarkable Other: No ascites or free air. The abdominal wall is normal. Musculoskeletal. There are advanced degenerative changes throughout the lumbar spine with grade 1-2 anterolisthesis of L4 on L5 which is presumably degenerative. There is likely multilevel spinal canal stenosis. IMPRESSION: 1. No acute abdominopelvic abnormality. No findings to explain the patient's GI bleeding. 2. Moderate-sized hiatal hernia. The patient is status post prior gastric bypass. 3. Advanced degenerative changes of the lumbar spine resulting in multilevel spinal canal stenosis. Aortic Atherosclerosis (ICD10-I70.0). Electronically Signed   By: Constance Holster M.D.   On: 09/12/2019 21:39    Procedures Procedures (including critical care time)  Medications Ordered in ED Medications  iohexol (OMNIPAQUE) 300 MG/ML solution 100 mL (100 mLs Intravenous Contrast Given 09/12/19 2104)    Initial Impression / Assessment and Plan / ED Course  I have reviewed the triage vital signs and the nursing notes.  Pertinent labs & imaging results that were available during my care of the patient were reviewed by me and considered in my medical decision making (see chart for details).   73yo F w/ h/o myelopathy, spinal stenosis, osteopenia, bipolar d/o, scoliosis, anxiety, lumbar DDD, obesity s/p bariatric surgery 2006 who presents with 3 day h/o SOB on exertion and 1 day h/o bright red blood per rectum with loose stools. Per patient, has h/o diverticulosis without h/o malignancy on recent colonoscopy 3-4 years ago. Vitals wnl though symptomatic here with ambulation. Exam notable for gross blood visualized on rectal exam, no overt fissures or hemorrhoids appreciated. CBC notable for Hb 8.4, decreased from baseline of ~10. FOBT+. Discussed with GI on call who recommended admission for observation and serial Hb checks with transfusion threshold of 7. CT A/P obtained  which did not show obvious etiology of bleed. Discussed case with hospitalist who will admit for observation and serial Hb checks.     Final Clinical Impressions(s) / ED Diagnoses   Final diagnoses:  Symptomatic anemia  Gastrointestinal hemorrhage associated with intestinal diverticulosis    ED Discharge Orders    None       Rory Percy, DO 09/12/19 2222    Elnora Morrison, MD 09/12/19 [phone]

## 2019-09-12 NOTE — H&P (Signed)
History and Physical    Alexandra Morrison [id number] DOB: 1946-08-22 DOA: 09/12/2019  PCP: Orpah Melter, MD  Patient coming from: Home I have personally briefly reviewed patient's old medical records in [place]  Chief Complaint: Dyspnea on exertion since 3 days and bright red blood per rectum since 2 days  HPI: Alexandra Morrison is a 73 y.o. female with medical history significant of lumbar degenerative disc disease, obesity status post bariatric surgery in 2006, anxiety/depression, bipolar disorder presents to emergency department due to dyspnea on exertion since 3 days and bright red blood per rectum since 2 days.  Patient reports that she has noticed bright red blood per rectum twice, not mixed with stool, denies association with constipation, hemorrhoids, painful defecation.  She takes over-the-counter Tylenol and Aleve 2 in the morning and 2 in the afternoon as needed for severe back pain.  She reports heartburn however denies association with decreased appetite, melena, hematemesis, weight loss or night sweats.  She had family history of colon cancer in her and who diagnosed at the age of 65s/70s.  She had a sigmoidoscopy? 3 to 4 years ago which shows polyp and biopsy came back benign.  She takes iron pills every day at bedtime.  She denies headache, blurry vision, chest pain, shortness of breath, palpitation, leg swelling, urinary or sleep changes.  She lives with her husband and denies smoking, alcohol, illicit drug use.  ED Course: Upon arrival: Blood pressure soft, H&H trended down from 10.3-8.4 in 1 year, fecal occult positive, CT abdomen shows hiatal hernia, severe DDD, EDP consulted GI-Dr. Therisa Doyne who recommended admission and to monitor H&H closely.  Review of Systems: As per HPI otherwise negative.    Past Medical History:  Diagnosis Date  . Abdominal aortic atherosclerosis (HCC)    Moderate  . Anxiety   . Arthritis   . Bipolar disorder ([place])    type 2  hypomanic  . Complication of anesthesia    pt request no spinal anesthesia only general  . DDD (degenerative disc disease), lumbar   . High cholesterol   . History of diabetes mellitus    resolved after weight loss  . History of mumps as a child   . History of sleep apnea    most recent study was negative for sleep apnea, resolved after weight loss   . Hypothyroidism   . Myelopathy ([place])   . Numbness and tingling in hands   . Osteopenia   . Osteoporosis   . Right arm fracture 1953   x2  . Scoliosis    per pt  . Spinal stenosis    Moderate to severe  . Vertigo   . Wears glasses     Past Surgical History:  Procedure Laterality Date  . APPENDECTOMY    . CARPAL TUNNEL RELEASE Bilateral   . [place]   x2  . EYE SURGERY Bilateral 2006   cataracts  . FRACTURE SURGERY     Arm  . GASTRIC BYPASS  2006  . POSTERIOR CERVICAL FUSION/FORAMINOTOMY N/A 11/25/2015   Procedure: Posterior Cervical Fusion with lateral mass fixation Cervical fiv-Thoracic one, cervical laminectomy  - Cervical six-Cervical seven - Cervical seven-Thoracic one;  Surgeon: Eustace Moore, MD;  Location: [facility];  Service: Neurosurgery;  Laterality: N/A;  . TOTAL KNEE ARTHROPLASTY Right 07/11/2017   Procedure: RIGHT TOTAL KNEE ARTHROPLASTY;  Surgeon: Susa Day, MD;  Location: [facility];  Service: Orthopedics;  Laterality: Right;  150 mins  .  TOTAL KNEE ARTHROPLASTY Left 03/12/2018   Procedure: LEFT TOTAL KNEE ARTHROPLASTY;  Surgeon: Susa Day, MD;  Location: [facility];  Service: Orthopedics;  Laterality: Left;  Adductor Block  . TOTAL SHOULDER ARTHROPLASTY Bilateral 2010, 2011     reports that she quit smoking about 26 years ago. She has never used smokeless tobacco. She reports current alcohol use. She reports that she does not use drugs.  No Known Allergies  No family history on file.  Prior to Admission medications   Medication Sig Start Date End Date Taking? Authorizing Provider   acetaminophen (TYLENOL) 500 MG tablet Take 1,000 mg by mouth See admin instructions. Take two tablets (1000 mg) by mouth every morning, may also take two tablets (1000 mg) every 6 hours as needed for pain   Yes [provider]  alendronate (FOSAMAX) 70 MG tablet Take 70 mg by mouth every Monday. Take with a full glass of water on an empty stomach.   Yes [provider]  ALPRAZolam Duanne Moron) 0.5 MG tablet Take 0.5 mg by mouth 3 (three) times daily as needed for anxiety.    Yes [provider]  amphetamine-dextroamphetamine (ADDERALL) 30 MG tablet Take 30 mg by mouth 2 (two) times daily with breakfast and lunch.    Yes [provider]  aspirin EC 81 MG tablet Take 81 mg by mouth daily with breakfast.   Yes [provider]  b complex vitamins tablet Take 1 tablet by mouth daily after supper.    Yes [provider]  Biotin 5000 MCG TABS Take 5,000 mcg by mouth daily.    Yes [provider]  buPROPion (WELLBUTRIN XL) 150 MG 24 hr tablet Take 150 mg by mouth daily with breakfast. Take with a 300 mg tablet for a total dose of 450 mg 08/04/19  Yes [provider]  buPROPion (WELLBUTRIN XL) 300 MG 24 hr tablet Take 300 mg by mouth daily with breakfast. Take with a 150 mg tablet for a total dose of 450 mg 12/30/17  Yes [provider]  calcium carbonate (OS-CAL) 600 MG tablet Take 600 mg by mouth 2 (two) times daily.   Yes [provider]  Cholecalciferol (VITAMIN D) 50 MCG (2000 UT) CAPS Take 2,000 Units by mouth daily after lunch.    Yes [provider]  Cinnamon 500 MG TABS Take 500 mg by mouth daily after supper.   Yes [provider]  Coenzyme Q10 300 MG CAPS Take 300 mg by mouth daily with lunch.   Yes [provider]  diphenhydrAMINE (BENADRYL ALLERGY) 25 MG tablet Take 25 mg by mouth 2 (two) times daily as needed for allergies.   Yes [provider]  divalproex (DEPAKOTE ER) 500 MG  24 hr tablet Take 1,000 mg by mouth daily with breakfast.  03/28/17  Yes [provider]  ferrous sulfate 325 (65 FE) MG tablet Take 325 mg by mouth at bedtime.   Yes [provider]  furosemide (LASIX) 20 MG tablet Take 20 mg by mouth 2 (two) times daily as needed for fluid or edema.    Yes [provider]  GLUCOSAMINE-CHONDROITIN PO Take 1 capsule by mouth daily.    Yes [provider]  levothyroxine (SYNTHROID, LEVOTHROID) 50 MCG tablet Take 50 mcg by mouth daily with breakfast.    Yes [provider]  lovastatin (MEVACOR) 10 MG tablet Take 10 mg by mouth daily with supper. 07/30/19  Yes [provider]  Magnesium 500 MG TABS  Take 500 mg by mouth daily after supper.   Yes [provider]  Menthol, Topical Analgesic, (BENGAY EX) Apply 1 application topically 2 (two) times daily as needed (pain).   Yes [provider]  methocarbamol (ROBAXIN) 500 MG tablet Take 1 tablet (500 mg total) by mouth every 6 (six) hours as needed for muscle spasms. Patient taking differently: Take 500 mg by mouth 2 (two) times daily as needed for muscle spasms.  03/14/18  Yes Lacie Draft M, PA-C  Multiple Vitamins-Minerals (MULTIVITAMIN WITH MINERALS) tablet Take 1 tablet by mouth daily with lunch.    Yes [provider]  nabumetone (RELAFEN) 500 MG tablet Take 500 mg by mouth 2 (two) times daily as needed (pain).  08/04/19  Yes [provider]  niacin 500 MG tablet Take 500 mg by mouth daily after lunch.    Yes [provider]  Omega-3 Fatty Acids (FISH OIL) 1000 MG CAPS Take 1,000 mg by mouth every morning.   Yes [provider]  phenylephrine (SUDAFED PE) 10 MG TABS tablet Take 10 mg by mouth 3 times/day as needed-between meals & bedtime (sinus congestion).   Yes [provider]  Polyethyl Glycol-Propyl Glycol (SYSTANE) 0.4-0.3 % GEL ophthalmic gel Place 1 application into both eyes daily as needed (dry  eyes/irritation/allergies).   Yes [provider]  Probiotic Product (PROBIOTIC DAILY PO) Take 1 capsule by mouth daily after supper.    Yes [provider]  traZODone (DESYREL) 100 MG tablet Take 100 mg by mouth at bedtime as needed for sleep.  07/09/19  Yes [provider]  Turmeric 500 MG CAPS Take 500 mg by mouth 2 (two) times daily.   Yes [provider]  vitamin B-12 (CYANOCOBALAMIN) 1000 MCG tablet Take 1,000 mcg by mouth daily after supper.    Yes [provider]  aspirin 81 MG chewable tablet Chew 1 tablet (81 mg total) by mouth 2 (two) times daily. Patient not taking: Reported on 09/12/2019 03/14/18   Cecilie Kicks, PA-C  docusate sodium (COLACE) 100 MG capsule Take 1 capsule (100 mg total) by mouth 2 (two) times daily. Patient not taking: Reported on 09/12/2019 03/14/18   Cecilie Kicks, PA-C  HYDROcodone-acetaminophen [facility]) 7.5-325 MG tablet Take 1-2 tablets by mouth every 6 (six) hours as needed for severe pain (pain score 7-10). Patient not taking: Reported on 09/12/2019 03/14/18   Cecilie Kicks, PA-C  polyethylene glycol [facility] / GLYCOLAX) packet Take 17 g by mouth daily as needed for mild constipation. Patient not taking: Reported on 09/12/2019 03/14/18   Cecilie Kicks, [place]    Physical Exam: Vitals:   09/12/19 1715 09/12/19 1745 09/12/19 1837 09/12/19 1900  BP: (!) 117/47 (!) 113/55 (!) 110/47 104/63  Pulse: 85 79 78 75  Resp: (!) 21 15 17  (!) 22  Temp:      TempSrc:      SpO2: 100% 100% 100% 100%  Weight:      Height:        Constitutional: NAD, calm, comfortable Eyes: PERRL, lids and conjunctivae: Pale  ENMT: Mucous membranes are moist. Posterior pharynx clear of any exudate or lesions.Normal dentition.  Neck: normal, supple, no masses, no thyromegaly Respiratory: clear to auscultation bilaterally, no wheezing, no crackles. Normal respiratory effort. No accessory muscle use.  Cardiovascular: Regular rate  and rhythm, no murmurs / rubs / gallops. No extremity edema. 2+ pedal pulses. No carotid bruits.  Abdomen: no tenderness, no masses palpated. No hepatosplenomegaly. Bowel  sounds positive.  Musculoskeletal: no clubbing / cyanosis. No joint deformity upper and lower extremities. Good ROM, no contractures. Normal muscle tone.  Skin: no rashes, lesions, ulcers. No induration Neurologic: CN 2-12 grossly intact. Sensation intact, DTR normal. Strength 5/5 in all 4.  Psychiatric: Normal judgment and insight. Alert and oriented x 3. Normal mood.    Labs on Admission: I have personally reviewed following labs and imaging studies  CBC: Recent Labs  Lab 09/12/19 1536  WBC 7.9  HGB 8.4*  HCT 25.7*  MCV 100.8*  PLT 123XX123   Basic Metabolic Panel: Recent Labs  Lab 09/12/19 1536  NA 138  K 3.8  CL 103  CO2 25  GLUCOSE 173*  BUN 22  CREATININE 0.92  CALCIUM 8.9   GFR: Estimated Creatinine Clearance: 43.6 mL/min (by C-G formula based on SCr of 0.92 mg/dL). Liver Function Tests: Recent Labs  Lab 09/12/19 1536  AST 23  ALT 19  ALKPHOS 33*  BILITOT 0.5  PROT 5.5*  ALBUMIN 3.4*   No results for input(s): LIPASE, AMYLASE in the last 168 hours. No results for input(s): AMMONIA in the last 168 hours. Coagulation Profile: No results for input(s): INR, PROTIME in the last 168 hours. Cardiac Enzymes: No results for input(s): CKTOTAL, CKMB, CKMBINDEX, TROPONINI in the last 168 hours. BNP (last 3 results) No results for input(s): PROBNP in the last 8760 hours. HbA1C: No results for input(s): HGBA1C in the last 72 hours. CBG: No results for input(s): GLUCAP in the last 168 hours. Lipid Profile: No results for input(s): CHOL, HDL, LDLCALC, TRIG, CHOLHDL, LDLDIRECT in the last 72 hours. Thyroid Function Tests: No results for input(s): TSH, T4TOTAL, FREET4, T3FREE, THYROIDAB in the last 72 hours. Anemia Panel: No results for input(s): VITAMINB12, FOLATE, FERRITIN, TIBC, IRON, RETICCTPCT in  the last 72 hours. Urine analysis:    Component Value Date/Time   COLORURINE COLORLESS (A) 03/05/2018 1341   APPEARANCEUR CLEAR 03/05/2018 1341   LABSPEC 1.004 (L) 03/05/2018 1341   PHURINE 5.0 03/05/2018 1341   GLUCOSEU NEGATIVE 03/05/2018 1341   HGBUR NEGATIVE 03/05/2018 1341   BILIRUBINUR NEGATIVE 03/05/2018 1341   KETONESUR NEGATIVE 03/05/2018 1341   PROTEINUR NEGATIVE 03/05/2018 1341   NITRITE NEGATIVE 03/05/2018 1341   LEUKOCYTESUR NEGATIVE 03/05/2018 1341    Radiological Exams on Admission: Dg Chest 2 View  Result Date: 09/12/2019 CLINICAL DATA:  Dyspnea EXAM: CHEST - 2 VIEW COMPARISON:  11/22/2015 chest radiograph. FINDINGS: Bilateral posterior cervical spine fusion hardware. Bilateral shoulder arthroplasties. Stable cardiomediastinal silhouette with normal heart size. No pneumothorax. No pleural effusion. Lungs appear clear, with no acute consolidative airspace disease and no pulmonary edema. Long segment moderate S-shaped thoracolumbar scoliosis. IMPRESSION: No active cardiopulmonary disease. Electronically Signed   By: Ilona Sorrel M.D.   On: 09/12/2019 16:34   Ct Abdomen Pelvis W Contrast  Result Date: 09/12/2019 CLINICAL DATA:  Bright red blood per rectum. EXAM: CT ABDOMEN AND PELVIS WITH CONTRAST TECHNIQUE: Multidetector CT imaging of the abdomen and pelvis was performed using the standard protocol following bolus administration of intravenous contrast. CONTRAST:  17mL OMNIPAQUE IOHEXOL 300 MG/ML  SOLN COMPARISON:  None. FINDINGS: Lower chest: The lung bases are clear. The heart size is normal. Hepatobiliary: The liver is normal. Normal gallbladder.There is no biliary ductal dilation. Pancreas: Normal contours without ductal dilatation. No peripancreatic fluid collection. Spleen: No splenic laceration or hematoma. Adrenals/Urinary Tract: --Adrenal glands: No adrenal hemorrhage. --Right kidney/ureter: No hydronephrosis or perinephric hematoma. --Left kidney/ureter: No  hydronephrosis or perinephric hematoma. --  Urinary bladder: Unremarkable. Stomach/Bowel: --Stomach/Duodenum: The patient is status post prior gastric bypass. There is a moderate-sized hiatal hernia. --Small bowel: No dilatation or inflammation. --Colon: No focal abnormality. --Appendix: Not visualized. No right lower quadrant inflammation or free fluid. Vascular/Lymphatic: Atherosclerotic calcification is present within the non-aneurysmal abdominal aorta, without hemodynamically significant stenosis. --No retroperitoneal lymphadenopathy. --No mesenteric lymphadenopathy. --No pelvic or inguinal lymphadenopathy. Reproductive: Unremarkable Other: No ascites or free air. The abdominal wall is normal. Musculoskeletal. There are advanced degenerative changes throughout the lumbar spine with grade 1-2 anterolisthesis of L4 on L5 which is presumably degenerative. There is likely multilevel spinal canal stenosis. IMPRESSION: 1. No acute abdominopelvic abnormality. No findings to explain the patient's GI bleeding. 2. Moderate-sized hiatal hernia. The patient is status post prior gastric bypass. 3. Advanced degenerative changes of the lumbar spine resulting in multilevel spinal canal stenosis. Aortic Atherosclerosis (ICD10-I70.0). Electronically Signed   By: Constance Holster M.D.   On: 09/12/2019 21:39    EKG: Normal sinus rhythm, no ST elevation or depression noted.  Assessment/Plan Principal Problem:   GI bleed Active Problems:   Hypothyroidism   Spinal stenosis   Symptomatic anemia   Macrocytosis   HLD (hyperlipidemia)   Anxiety   DDD (degenerative disc disease), lumbar   Symptomatic anemia: -Secondary from upper/lower GI bleed.  History of chronic over-the-counter NSAID use. -H&H dropped from 10.3-8.4 in 1 year.  Fecal occult positive in ED -CT abdomen/pelvis as above. -Admit patient on the floor for close monitoring. -EDP consulted GI-await recommendations. -Start on clear liquid diet and n.p.o.  after midnight in case she needs to undergo EGD/colonoscopy. -Monitor H&H and vitals closely.  No NSAIDs. -Zofran as needed for nausea and vomiting, morphine as needed for severe pain, will start on PPI.  Advanced lumbar degenerative disc disease: -We will hold NSAIDs for now, -Morphine as needed.  Hyperlipidemia: Continue statin  Anxiety/depression: -Continue Xanax, Wellbutrin, trazodone  Bipolar disorder: Continue Depakote  Macrocytosis: -We will check TSH, B12, folate.  Hypothyroidism: Check TSH -Continue levothyroxine.    Chronic leg swelling: Not noted on exam. -Hold p.o. Lasix for now as blood pressure is on lower side.   DVT prophylaxis: TED/SCD Code Status: Full code Family Communication: None present at bedside.  Plan of care discussed with patient in length and he verbalized understanding and agreed with it. Disposition Plan: TBD Consults called: GI-Dr. Therisa Doyne by EDP Admission status: Inpatient  Mckinley Jewel MD [facility] Pager [phone]  If 7PM-7AM, please contact night-coverage [email or web address] Password [facility]  09/12/2019, 10:39 PM

## 2019-09-12 NOTE — ED Notes (Signed)
The  Pt is here for rectal bleeding for the past 2 days with her nms she has had it once or twice  A day  No pain related to that  Bright red blood

## 2019-09-12 NOTE — ED Notes (Signed)
Pt ambulated to bathroom with one person minimal assistance. After exiting the bathroom pt reported feeling SOB, O2 sat checked on return to room and remained at 100%.

## 2019-09-13 LAB — BASIC METABOLIC PANEL
Anion gap: 7 (ref 5–15)
BUN: 18 mg/dL (ref 8–23)
CO2: 24 mmol/L (ref 22–32)
Calcium: 8.4 mg/dL — ABNORMAL LOW (ref 8.9–10.3)
Chloride: 107 mmol/L (ref 98–111)
Creatinine, Ser: 0.83 mg/dL (ref 0.44–1.00)
GFR calc Af Amer: >60 mL/min (ref >60)
GFR calc non Af Amer: >60 mL/min (ref >60)
Glucose, Bld: 103 mg/dL — ABNORMAL HIGH (ref 70–99)
Potassium: 3.6 mmol/L (ref 3.5–5.1)
Sodium: 138 mmol/L (ref 135–145)

## 2019-09-13 LAB — CBC
HCT: 20.4 % — ABNORMAL LOW (ref 36.0–46.0)
Hemoglobin: 6.9 g/dL — CL (ref 12.0–15.0)
MCH: 32.5 pg (ref 26.0–34.0)
MCHC: 33.8 g/dL (ref 30.0–36.0)
MCV: 96.2 fL (ref 80.0–100.0)
Platelets: 155 10*3/uL (ref 150–400)
RBC: 2.12 MIL/uL — ABNORMAL LOW (ref 3.87–5.11)
RDW: 14 % (ref 11.5–15.5)
WBC: 8.6 10*3/uL (ref 4.0–10.5)
nRBC: 0 % (ref 0.0–0.2)

## 2019-09-13 LAB — PREPARE RBC (CROSSMATCH)

## 2019-09-13 LAB — TSH: TSH: 4.146 u[IU]/mL (ref 0.350–4.500)

## 2019-09-13 LAB — PROTIME-INR
INR: 1.1 (ref 0.8–1.2)
Prothrombin Time: 14 seconds (ref 11.4–15.2)

## 2019-09-13 LAB — VITAMIN B12: Vitamin B-12: 2019 pg/mL — ABNORMAL HIGH (ref 180–914)

## 2019-09-13 LAB — HEMOGLOBIN AND HEMATOCRIT, BLOOD
HCT: 39.7 % (ref 36.0–46.0)
Hemoglobin: 13.5 g/dL (ref 12.0–15.0)

## 2019-09-13 MED ORDER — DICLOFENAC EPOLAMINE 1.3 % EX PTCH
1.0000 | MEDICATED_PATCH | Freq: Two times a day (BID) | CUTANEOUS | Status: DC
  Administered 2019-09-13 – 2019-09-15 (×5): 1 via TRANSDERMAL
  Filled 2019-09-13 (×6): qty 1

## 2019-09-13 MED ORDER — PEG 3350-KCL-NA BICARB-NACL 420 G PO SOLR
4000.0000 mL | Freq: Once | ORAL | Status: AC
  Administered 2019-09-13: 4000 mL via ORAL
  Filled 2019-09-13: qty 4000

## 2019-09-13 MED ORDER — SODIUM CHLORIDE 0.9% IV SOLUTION
Freq: Once | INTRAVENOUS | Status: AC
  Administered 2019-09-13: 09:00:00 via INTRAVENOUS

## 2019-09-13 MED ORDER — SODIUM CHLORIDE 0.9% IV SOLUTION
Freq: Once | INTRAVENOUS | Status: AC
  Administered 2019-09-13: 05:00:00 via INTRAVENOUS

## 2019-09-13 MED ORDER — SODIUM CHLORIDE 0.9% FLUSH
10.0000 mL | Freq: Two times a day (BID) | INTRAVENOUS | Status: DC
  Administered 2019-09-13 – 2019-09-15 (×2): 10 mL

## 2019-09-13 MED ORDER — FUROSEMIDE 10 MG/ML IJ SOLN
20.0000 mg | Freq: Once | INTRAMUSCULAR | Status: AC
  Administered 2019-09-13: 20 mg via INTRAVENOUS
  Filled 2019-09-13: qty 2

## 2019-09-13 MED ORDER — SODIUM CHLORIDE 0.9% FLUSH: 10.0000 mL | INTRAVENOUS | Status: DC | PRN

## 2019-09-13 MED ORDER — ACETAMINOPHEN 325 MG PO TABS
650.0000 mg | ORAL_TABLET | Freq: Once | ORAL | Status: AC
  Administered 2019-09-13: 650 mg via ORAL
  Filled 2019-09-13: qty 2

## 2019-09-13 NOTE — Consult Note (Signed)
[facility] Consult  Referring Provider: Elnora Morrison, MD/ER Primary Care Physician:  Orpah Melter, MD Primary Gastroenterologist: Dr.Schooler  Reason for Consultation:  Bloody BM  HPI: Alexandra Morrison is a 73 y.o. female with past medical history for myelopathy, spinal stenosis, bariatric surgery gastric bypass in 2016 in [place], osteopenia, bipolar disorder, anxiety, scoliosis, lumbar degenerative disease, presented to the ER with complains of exertional shortness of breath and dizziness for the last 3 days.  3 days ago she noticed small amount of blood in the stool, the very next day she noticed a few more episodes of blood in the stool described as bright red.  Her husband took her blood pressure at home which was 87/ 55 mmHg and decided to bring the patient to the hospital. Patient denies abdominal or rectal pain. She takes double strength Tylenol and Aleve on a daily basis for back pain.  Denies black stools.  Because of a gastric bypass she has early satiety and complains of frequent nausea and vomiting if she eats too much.  She denies difficulty swallowing or pain on swallowing but complains of significant acid reflux and heartburn for which she takes Rolaids.  Normally she has a bowel movement every 2 days and has not noted bright red blood in the past. Last colonoscopy was in 2016 by Dr. Michail Sermon for screening, she had hyperplastic sigmoid polyps removed and was noted to have small sigmoid diverticulosis. Patient denies recent unintentional weight loss and denies loss of appetite.   Past Medical History:  Diagnosis Date  . Abdominal aortic atherosclerosis (HCC)    Moderate  . Anxiety   . Arthritis   . Bipolar disorder ([place])    type 2 hypomanic  . Complication of anesthesia    pt request no spinal anesthesia only general  . DDD (degenerative disc disease), lumbar   . High cholesterol   . History of diabetes mellitus    resolved after weight loss  . History  of mumps as a child   . History of sleep apnea    most recent study was negative for sleep apnea, resolved after weight loss   . Hypothyroidism   . Myelopathy ([place])   . Numbness and tingling in hands   . Osteopenia   . Osteoporosis   . Right arm fracture 1953   x2  . Scoliosis    per pt  . Spinal stenosis    Moderate to severe  . Vertigo   . Wears glasses     Past Surgical History:  Procedure Laterality Date  . APPENDECTOMY    . CARPAL TUNNEL RELEASE Bilateral   . [place]   x2  . EYE SURGERY Bilateral 2006   cataracts  . FRACTURE SURGERY     Arm  . GASTRIC BYPASS  2006  . POSTERIOR CERVICAL FUSION/FORAMINOTOMY N/A 11/25/2015   Procedure: Posterior Cervical Fusion with lateral mass fixation Cervical fiv-Thoracic one, cervical laminectomy  - Cervical six-Cervical seven - Cervical seven-Thoracic one;  Surgeon: Eustace Moore, MD;  Location: [facility];  Service: Neurosurgery;  Laterality: N/A;  . TOTAL KNEE ARTHROPLASTY Right 07/11/2017   Procedure: RIGHT TOTAL KNEE ARTHROPLASTY;  Surgeon: Susa Day, MD;  Location: [facility];  Service: Orthopedics;  Laterality: Right;  150 mins  . TOTAL KNEE ARTHROPLASTY Left 03/12/2018   Procedure: LEFT TOTAL KNEE ARTHROPLASTY;  Surgeon: Susa Day, MD;  Location: [facility];  Service: Orthopedics;  Laterality: Left;  Adductor Block  . TOTAL SHOULDER ARTHROPLASTY  Bilateral 2010, 2011    Prior to Admission medications   Medication Sig Start Date End Date Taking? Authorizing Provider  acetaminophen (TYLENOL) 500 MG tablet Take 1,000 mg by mouth See admin instructions. Take two tablets (1000 mg) by mouth every morning, may also take two tablets (1000 mg) every 6 hours as needed for pain   Yes [provider]  alendronate (FOSAMAX) 70 MG tablet Take 70 mg by mouth every Monday. Take with a full glass of water on an empty stomach.   Yes [provider]  ALPRAZolam Duanne Moron) 0.5 MG tablet Take 0.5 mg by mouth 3  (three) times daily as needed for anxiety.    Yes [provider]  amphetamine-dextroamphetamine (ADDERALL) 30 MG tablet Take 30 mg by mouth 2 (two) times daily with breakfast and lunch.    Yes [provider]  aspirin EC 81 MG tablet Take 81 mg by mouth daily with breakfast.   Yes [provider]  b complex vitamins tablet Take 1 tablet by mouth daily after supper.    Yes [provider]  Biotin 5000 MCG TABS Take 5,000 mcg by mouth daily.    Yes [provider]  buPROPion (WELLBUTRIN XL) 150 MG 24 hr tablet Take 150 mg by mouth daily with breakfast. Take with a 300 mg tablet for a total dose of 450 mg 08/04/19  Yes [provider]  buPROPion (WELLBUTRIN XL) 300 MG 24 hr tablet Take 300 mg by mouth daily with breakfast. Take with a 150 mg tablet for a total dose of 450 mg 12/30/17  Yes [provider]  calcium carbonate (OS-CAL) 600 MG tablet Take 600 mg by mouth 2 (two) times daily.   Yes [provider]  Cholecalciferol (VITAMIN D) 50 MCG (2000 UT) CAPS Take 2,000 Units by mouth daily after lunch.    Yes [provider]  Cinnamon 500 MG TABS Take 500 mg by mouth daily after supper.   Yes [provider]  Coenzyme Q10 300 MG CAPS Take 300 mg by mouth daily with lunch.   Yes [provider]  diphenhydrAMINE (BENADRYL ALLERGY) 25 MG tablet Take 25 mg by mouth 2 (two) times daily as needed for allergies.   Yes [provider]  divalproex (DEPAKOTE ER) 500 MG 24 hr tablet Take 1,000 mg by mouth daily with breakfast.  03/28/17  Yes [provider]  ferrous sulfate 325 (65 FE) MG tablet Take 325 mg by mouth at bedtime.   Yes [provider]  furosemide (LASIX) 20 MG tablet Take 20 mg by mouth 2 (two) times daily as needed for fluid or edema.    Yes [provider]  GLUCOSAMINE-CHONDROITIN PO Take 1 capsule by mouth daily.    Yes [provider]  levothyroxine  (SYNTHROID, LEVOTHROID) 50 MCG tablet Take 50 mcg by mouth daily with breakfast.    Yes [provider]  lovastatin (MEVACOR) 10 MG tablet Take 10 mg by mouth daily with supper. 07/30/19  Yes [provider]  Magnesium 500 MG TABS Take 500 mg by mouth daily after supper.   Yes [provider]  Menthol, Topical Analgesic, (BENGAY EX) Apply 1 application topically 2 (two) times daily as needed (pain).   Yes [provider]  methocarbamol (ROBAXIN) 500 MG tablet Take 1 tablet (500 mg total) by mouth every 6 (six) hours as needed for muscle spasms. Patient taking differently: Take 500 mg by mouth 2 (two) times daily as needed for  muscle spasms.  03/14/18  Yes Lacie Draft M, PA-C  Multiple Vitamins-Minerals (MULTIVITAMIN WITH MINERALS) tablet Take 1 tablet by mouth daily with lunch.    Yes [provider]  nabumetone (RELAFEN) 500 MG tablet Take 500 mg by mouth 2 (two) times daily as needed (pain).  08/04/19  Yes [provider]  niacin 500 MG tablet Take 500 mg by mouth daily after lunch.    Yes [provider]  Omega-3 Fatty Acids (FISH OIL) 1000 MG CAPS Take 1,000 mg by mouth every morning.   Yes [provider]  phenylephrine (SUDAFED PE) 10 MG TABS tablet Take 10 mg by mouth 3 times/day as needed-between meals & bedtime (sinus congestion).   Yes [provider]  Polyethyl Glycol-Propyl Glycol (SYSTANE) 0.4-0.3 % GEL ophthalmic gel Place 1 application into both eyes daily as needed (dry eyes/irritation/allergies).   Yes [provider]  Probiotic Product (PROBIOTIC DAILY PO) Take 1 capsule by mouth daily after supper.    Yes [provider]  traZODone (DESYREL) 100 MG tablet Take 100 mg by mouth at bedtime as needed for sleep.  07/09/19  Yes [provider]  Turmeric 500 MG CAPS Take 500 mg by mouth 2 (two) times daily.   Yes [provider]  vitamin B-12 (CYANOCOBALAMIN) 1000 MCG  tablet Take 1,000 mcg by mouth daily after supper.    Yes [provider]  aspirin 81 MG chewable tablet Chew 1 tablet (81 mg total) by mouth 2 (two) times daily. Patient not taking: Reported on 09/12/2019 03/14/18   Cecilie Kicks, PA-C  docusate sodium (COLACE) 100 MG capsule Take 1 capsule (100 mg total) by mouth 2 (two) times daily. Patient not taking: Reported on 09/12/2019 03/14/18   Cecilie Kicks, PA-C  HYDROcodone-acetaminophen [facility]) 7.5-325 MG tablet Take 1-2 tablets by mouth every 6 (six) hours as needed for severe pain (pain score 7-10). Patient not taking: Reported on 09/12/2019 03/14/18   Cecilie Kicks, PA-C  polyethylene glycol [facility] / GLYCOLAX) packet Take 17 g by mouth daily as needed for mild constipation. Patient not taking: Reported on 09/12/2019 03/14/18   Cecilie Kicks, PA-C    Current Facility-Administered Medications  Medication Dose Route Frequency Provider Last Rate Last Dose  . 0.9 %  sodium chloride infusion   Intravenous Continuous Pahwani, Michell Heinrich, MD   Stopped at 09/13/19 0453  . acetaminophen (TYLENOL) tablet 650 mg  650 mg Oral Q6H PRN Pahwani, Rinka R, MD       Or  . acetaminophen (TYLENOL) suppository 650 mg  650 mg Rectal Q6H PRN Pahwani, Rinka R, MD      . ALPRAZolam Duanne Moron) tablet 0.5 mg  0.5 mg Oral TID PRN Pahwani, Rinka R, MD   0.5 mg at 09/13/19 0146  . amphetamine-dextroamphetamine (ADDERALL) tablet 30 mg  30 mg Oral BID WC Pahwani, Rinka R, MD      . buPROPion (WELLBUTRIN XL) 24 hr tablet 450 mg  450 mg Oral Q breakfast Pahwani, Rinka R, MD      . divalproex (DEPAKOTE ER) 24 hr tablet 1,000 mg  1,000 mg Oral Q breakfast Pahwani, Rinka R, MD      . ferrous sulfate tablet 325 mg  325 mg Oral QHS Pahwani, Rinka R, MD   325 mg at 09/13/19 0146  . levothyroxine (SYNTHROID) tablet 50 mcg  50 mcg Oral Q breakfast Pahwani, Rinka R, MD   50 mcg at 09/13/19 0616  . morphine 2 MG/ML  injection 2 mg  2 mg Intravenous Q6H PRN Pahwani, Rinka  R, MD      . ondansetron (ZOFRAN) tablet 4 mg  4 mg Oral Q6H PRN Pahwani, Rinka R, MD       Or  . ondansetron (ZOFRAN) injection 4 mg  4 mg Intravenous Q6H PRN Pahwani, Rinka R, MD      . pantoprazole (PROTONIX) injection 40 mg  40 mg Intravenous Q12H Pahwani, Rinka R, MD   40 mg at 09/13/19 0147  . polyethylene glycol-electrolytes (NuLYTELY/GoLYTELY) solution 4,000 mL  4,000 mL Oral Once Ronnette Juniper, MD      . pravastatin (PRAVACHOL) tablet 10 mg  10 mg Oral q1800 Pahwani, Rinka R, MD   10 mg at 09/13/19 0146  . traZODone (DESYREL) tablet 100 mg  100 mg Oral QHS PRN Pahwani, Michell Heinrich, MD        Allergies as of 09/12/2019  . (No Known Allergies)    No family history on file.  Social History   Socioeconomic History  . Marital status: Married    Spouse name: Not on file  . Number of children: Not on file  . Years of education: Not on file  . Highest education level: Not on file  Occupational History  . Not on file  Social Needs  . Financial resource strain: Not on file  . Food insecurity    Worry: Not on file    Inability: Not on file  . Transportation needs    Medical: Not on file    Non-medical: Not on file  Tobacco Use  . Smoking status: Former Smoker    Quit date: 01/20/1993    Years since quitting: 26.6  . Smokeless tobacco: Never Used  Substance and Sexual Activity  . Alcohol use: Yes    Comment: rarely  . Drug use: No  . Sexual activity: Yes    Birth control/protection: Post-menopausal  Lifestyle  . Physical activity    Days per week: Not on file    Minutes per session: Not on file  . Stress: Not on file  Relationships  . Social Herbalist on phone: Not on file    Gets together: Not on file    Attends religious service: Not on file    Active member of club or organization: Not on file    Attends meetings of clubs or organizations: Not on file    Relationship status: Not on file  . Intimate partner violence    Fear of current or ex partner: Not on  file    Emotionally abused: Not on file    Physically abused: Not on file    Forced sexual activity: Not on file  Other Topics Concern  . Not on file  Social History Narrative  . Not on file    Review of Systems: Positive for: GI: Described in detail in HPI.    Gen:  fatigue, weakness, malaise,Denies any fever, chills, rigors, night sweats, anorexia, involuntary weight loss, and sleep disorder CV: Denies chest pain, angina, palpitations, syncope, orthopnea, PND, peripheral edema, and claudication. Resp: Denies dyspnea, cough, sputum, wheezing, coughing up blood. GU : Denies urinary burning, blood in urine, urinary frequency, urinary hesitancy, nocturnal urination, and urinary incontinence. MS: Back pain, prior history of bilateral knee replacement, bilateral shoulder replacement and multiple applications of the back Derm: Denies rash, itching, oral ulcerations, hives, unhealing ulcers.  Psych: Denies depression, anxiety, memory loss, suicidal ideation, hallucinations,  and confusion. Heme: Denies bruising  and enlarged lymph nodes. Neuro:  Denies any headaches, dizziness, paresthesias. Endo:  Denies any problems with DM, thyroid, adrenal function.  Physical Exam: Vital signs in last 24 hours: Temp:  [97.7 F (36.5 C)-98.3 F (36.8 C)] 97.8 F (36.6 C) (11/22 0711) Pulse Rate:  [65-98] 79 (11/22 0711) Resp:  [9-22] 16 (11/22 0711) BP: (88-135)/(41-63) 98/45 (11/22 0711) SpO2:  [93 %-100 %] 95 % (11/22 0711) Weight:  [60 kg-61.7 kg] 60 kg (11/22 0453) Last BM Date: 09/12/19  General:   Alert,  Well-developed, well-nourished, pleasant and cooperative in NAD Head:  Normocephalic and atraumatic. Eyes:  Sclera clear, no icterus.   Prominent pallor Ears:  Normal auditory acuity. Nose:  No deformity, discharge,  or lesions. Mouth:  No deformity or lesions.  Oropharynx pink & moist. Neck:  Supple; no masses or thyromegaly. Lungs:  Clear throughout to auscultation.   No wheezes,  crackles, or rhonchi. No acute distress. Heart:  Regular rate and rhythm; no murmurs, clicks, rubs,  or gallops. Extremities:  Without clubbing or edema. Neurologic:  Alert and  oriented x4;  grossly normal neurologically. Skin:  Intact without significant lesions or rashes. Psych:  Alert and cooperative. Normal mood and affect. Abdomen:  Soft, nontender and nondistended. No masses, hepatosplenomegaly or hernias noted. Normal bowel sounds, without guarding, and without rebound.         Lab Results: Recent Labs    09/12/19 1536 09/13/19 0208  WBC 7.9 8.6  HGB 8.4* 6.9*  HCT 25.7* 20.4*  PLT 187 155   BMET Recent Labs    09/12/19 1536 09/13/19 0208  NA 138 138  K 3.8 3.6  CL 103 107  CO2 25 24  GLUCOSE 173* 103*  BUN 22 18  CREATININE 0.92 0.83  CALCIUM 8.9 8.4*   LFT Recent Labs    09/12/19 1536  PROT 5.5*  ALBUMIN 3.4*  AST 23  ALT 19  ALKPHOS 33*  BILITOT 0.5   PT/INR Recent Labs    09/13/19 0208  LABPROT 14.0  INR 1.1    Studies/Results: Dg Chest 2 View  Result Date: 09/12/2019 CLINICAL DATA:  Dyspnea EXAM: CHEST - 2 VIEW COMPARISON:  11/22/2015 chest radiograph. FINDINGS: Bilateral posterior cervical spine fusion hardware. Bilateral shoulder arthroplasties. Stable cardiomediastinal silhouette with normal heart size. No pneumothorax. No pleural effusion. Lungs appear clear, with no acute consolidative airspace disease and no pulmonary edema. Long segment moderate S-shaped thoracolumbar scoliosis. IMPRESSION: No active cardiopulmonary disease. Electronically Signed   By: Ilona Sorrel M.D.   On: 09/12/2019 16:34   Ct Abdomen Pelvis W Contrast  Result Date: 09/12/2019 CLINICAL DATA:  Bright red blood per rectum. EXAM: CT ABDOMEN AND PELVIS WITH CONTRAST TECHNIQUE: Multidetector CT imaging of the abdomen and pelvis was performed using the standard protocol following bolus administration of intravenous contrast. CONTRAST:  14mL OMNIPAQUE IOHEXOL 300 MG/ML   SOLN COMPARISON:  None. FINDINGS: Lower chest: The lung bases are clear. The heart size is normal. Hepatobiliary: The liver is normal. Normal gallbladder.There is no biliary ductal dilation. Pancreas: Normal contours without ductal dilatation. No peripancreatic fluid collection. Spleen: No splenic laceration or hematoma. Adrenals/Urinary Tract: --Adrenal glands: No adrenal hemorrhage. --Right kidney/ureter: No hydronephrosis or perinephric hematoma. --Left kidney/ureter: No hydronephrosis or perinephric hematoma. --Urinary bladder: Unremarkable. Stomach/Bowel: --Stomach/Duodenum: The patient is status post prior gastric bypass. There is a moderate-sized hiatal hernia. --Small bowel: No dilatation or inflammation. --Colon: No focal abnormality. --Appendix: Not visualized. No right lower quadrant inflammation or free fluid. Vascular/Lymphatic:  Atherosclerotic calcification is present within the non-aneurysmal abdominal aorta, without hemodynamically significant stenosis. --No retroperitoneal lymphadenopathy. --No mesenteric lymphadenopathy. --No pelvic or inguinal lymphadenopathy. Reproductive: Unremarkable Other: No ascites or free air. The abdominal wall is normal. Musculoskeletal. There are advanced degenerative changes throughout the lumbar spine with grade 1-2 anterolisthesis of L4 on L5 which is presumably degenerative. There is likely multilevel spinal canal stenosis. IMPRESSION: 1. No acute abdominopelvic abnormality. No findings to explain the patient's GI bleeding. 2. Moderate-sized hiatal hernia. The patient is status post prior gastric bypass. 3. Advanced degenerative changes of the lumbar spine resulting in multilevel spinal canal stenosis. Aortic Atherosclerosis (ICD10-I70.0). Electronically Signed   By: Constance Holster M.D.   On: 09/12/2019 21:39    Impression: Bloody bowel movement, presented with a hemoglobin of 8.4 which is dropped to 6.9, despite 1 unit of PRBC transfusion, slightly  hypotensive with a blood pressure of 98 x 45 mmHg today, not tachycardic  Significant NSAID use on aspirin 81 mg daily, elevated BUN/creatinine ratio of 22/0.92 on admission, possible upper GI bleed, possible peptic ulcer disease/anastomotic ulcer.  Small sigmoid diverticulosis noted in colonoscopy from 2016 no acute abdominopelvic pathology noted on CAT scan except for moderate-sized hiatal hernia and prior gastric bypass  SARS Coronavirus 2 NEGATIVE     Plan: This could be upper GI bleeding from peptic ulcer disease or lower GI bleeding from diverticulosis. Recommend 2 more units of PRBC transfusion. Clear liquid diet, continue Protonix 40 mg IV every 12 hours Will start colonic prep day, in anticipation of endoscopy and colonoscopy tomorrow morning. The risks and the benefits of the procedure were discussed with the patient in details. Understands and verbalizes consent.   LOS: 1 day   Ronnette Juniper, MD  09/13/2019, 8:04 AM

## 2019-09-13 NOTE — Progress Notes (Signed)
2 units of PRBC completed, patient denies any distress. Lasix given, post transfusion h/h ordered. Golytely initated. NPO c MN order reviewed with patient. No other concerns voice at this time. Will continue to monitor.  Ave Filter, RN

## 2019-09-13 NOTE — Progress Notes (Signed)
Patient denies seeing blood in her stool with bowel movements at this time.   Ave Filter, RN

## 2019-09-13 NOTE — Progress Notes (Signed)
Hospitalist progress note   Alexandra Morrison [id number] DOB: 1945/12/19 DOA: 09/12/2019  PCP: Orpah Melter, MD   Narrative:  73 year old Caucasian female bilateral osteoarthritis status post knee repairs 2018 2019, cervical stenosis status post procedure 2017 bariatric surgery 2006, bipolar Admitted with rectal bleeding X 48 hours prior to admission in addition to S OB Assessment & Plan: Probable upper GI bleed, prior sigmoid diverticulosis 2016 prior GOP 2006-transfuse 1 unit PRBC 11/21 giving 2 more units today as hemoglobin 6.9 continue Protonix IV twice daily-getting bowel prep later on today can continue saline 100 cc/h Management of back pain may be an issue going forward as she may not be able to use NSAIDs Hemodynamically stable at this time Lumbar degenerative disc disease on NSAIDs having mainly back and hip pain-I have explained to her we may have some challenges with pain control-May use topical agents such as Flector patch also is on morphine 2 mg every 6 hourly Osteoarthritis-as above Bipolar,?  ADHD continue Xanax 0.5 3 times daily Adderall 30 twice daily Wellbutrin 4:50 AM trazodone 100 p.m Depakote 1000 every morning. Hypothyroid continue Synthroid 50 mcg daily  Subjective: Awake alert coherent no distress less dizzy than previously no further bleeding Consultants:   GI Procedures:   None Antimicrobials:   No Data Reviewed:  Labs hemoglobin down from admissionBaseline usually around 10 was 8.4 on admit currently 6.9 platelets 150s BUN/creatinine down from admission 22/0.9018/0.8 Radiology Studies:   Objective: Vitals:   09/13/19 0453 09/13/19 0459 09/13/19 0600 09/13/19 0711  BP:  (!) 94/41 (!) 98/43 (!) 98/45  Pulse:  73 66 79  Resp:  18 20 16   Temp:  98.1 F (36.7 C) 98.3 F (36.8 C) 97.8 F (36.6 C)  TempSrc:  Oral Oral Oral  SpO2:  100% 95% 95%  Weight: 60 kg     Height:        Intake/Output Summary (Last 24 hours) at 09/13/2019 0759 Last data  filed at 09/13/2019 0453 Gross per 24 hour  Intake 655 ml  Output -  Net 655 ml   Filed Weights   09/12/19 1554 09/13/19 0453  Weight: 61.7 kg 60 kg    Examination: Awake coherent pleasant looks about stated age EOMI NCAT no icterus no pallor throat soft supple S1-S2 no murmur rub or gallop abdomen soft no rebound no guarding ROM intact in lower extremities although slightly limited by pain to hip flexion on the right side no lower extremity edema neurologically intact no focal neurological deficit  Scheduled Meds: . amphetamine-dextroamphetamine  30 mg Oral BID WC  . buPROPion  450 mg Oral Q breakfast  . divalproex  1,000 mg Oral Q breakfast  . ferrous sulfate  325 mg Oral QHS  . levothyroxine  50 mcg Oral Q breakfast  . pantoprazole (PROTONIX) IV  40 mg Intravenous Q12H  . pravastatin  10 mg Oral q1800   Continuous Infusions: . sodium chloride Stopped (09/13/19 0453)     LOS: 1 day   Time spent: [place], MD [facility]  09/13/2019, 7:59 AM

## 2019-09-13 NOTE — Progress Notes (Addendum)
Verified with attending MD that patient only needs two units of  PRBC at this time. Notified blood bank.  Ave Filter, RN

## 2019-09-13 NOTE — ED Notes (Signed)
Report called to rn on 5 c 

## 2019-09-14 ENCOUNTER — Inpatient Hospital Stay (HOSPITAL_COMMUNITY): Payer: Medicare Other | Admitting: Certified Registered"

## 2019-09-14 ENCOUNTER — Encounter (HOSPITAL_COMMUNITY)
Admission: EM | Disposition: A | Discharge status: Home / Self Care | Payer: Self-pay | Source: Home / Self Care | Attending: Family Medicine

## 2019-09-14 ENCOUNTER — Encounter (HOSPITAL_COMMUNITY): Payer: Self-pay | Admitting: Emergency Medicine

## 2019-09-14 HISTORY — PX: COLONOSCOPY WITH PROPOFOL: SHX5780

## 2019-09-14 HISTORY — PX: ESOPHAGOGASTRODUODENOSCOPY (EGD) WITH PROPOFOL: SHX5813

## 2019-09-14 LAB — CBC WITH DIFFERENTIAL/PLATELET
Abs Immature Granulocytes: 0.02 10*3/uL (ref 0.00–0.07)
Basophils Absolute: 0 10*3/uL (ref 0.0–0.1)
Basophils Relative: 1 %
Eosinophils Absolute: 0.1 10*3/uL (ref 0.0–0.5)
Eosinophils Relative: 2 %
HCT: 32.1 % — ABNORMAL LOW (ref 36.0–46.0)
Hemoglobin: 10.7 g/dL — ABNORMAL LOW (ref 12.0–15.0)
Immature Granulocytes: 0 %
Lymphocytes Relative: 43 %
Lymphs Abs: 2.7 10*3/uL (ref 0.7–4.0)
MCH: 31.5 pg (ref 26.0–34.0)
MCHC: 33.3 g/dL (ref 30.0–36.0)
MCV: 94.4 fL (ref 80.0–100.0)
Monocytes Absolute: 0.5 10*3/uL (ref 0.1–1.0)
Monocytes Relative: 8 %
Neutro Abs: 2.9 10*3/uL (ref 1.7–7.7)
Neutrophils Relative %: 46 %
Platelets: 139 10*3/uL — ABNORMAL LOW (ref 150–400)
RBC: 3.4 MIL/uL — ABNORMAL LOW (ref 3.87–5.11)
RDW: 15.4 % (ref 11.5–15.5)
WBC: 6.2 10*3/uL (ref 4.0–10.5)
nRBC: 0 % (ref 0.0–0.2)

## 2019-09-14 LAB — BPAM RBC
Blood Product Expiration Date: 202012132359
Blood Product Expiration Date: 202012132359
Blood Product Expiration Date: 202012162359
ISSUE DATE / TIME: 202011220432
ISSUE DATE / TIME: 202011220938
ISSUE DATE / TIME: 202011221310
Unit Type and Rh: 6200
Unit Type and Rh: 6200
Unit Type and Rh: 6200

## 2019-09-14 LAB — TYPE AND SCREEN
ABO/RH(D): A POS
Antibody Screen: NEGATIVE
Unit division: 0
Unit division: 0
Unit division: 0

## 2019-09-14 LAB — FOLATE RBC
Folate, Hemolysate: 410 ng/mL
Folate, RBC: 1952 ng/mL (ref >498)
Hematocrit: 21 % — ABNORMAL LOW (ref 34.0–46.6)

## 2019-09-14 SURGERY — ESOPHAGOGASTRODUODENOSCOPY (EGD) WITH PROPOFOL
Anesthesia: Monitor Anesthesia Care

## 2019-09-14 MED ORDER — PROPOFOL 500 MG/50ML IV EMUL
INTRAVENOUS | Status: DC | PRN
  Administered 2019-09-14: 175 ug/kg/min via INTRAVENOUS

## 2019-09-14 MED ORDER — EPHEDRINE SULFATE 50 MG/ML IJ SOLN
INTRAMUSCULAR | Status: DC | PRN
  Administered 2019-09-14: 10 mg via INTRAVENOUS
  Administered 2019-09-14 (×4): 5 mg via INTRAVENOUS

## 2019-09-14 MED ORDER — PROPOFOL 10 MG/ML IV BOLUS
INTRAVENOUS | Status: DC | PRN
  Administered 2019-09-14: 20 mg via INTRAVENOUS
  Administered 2019-09-14: 10 mg via INTRAVENOUS

## 2019-09-14 MED ORDER — LORAZEPAM 2 MG/ML IJ SOLN
0.5000 mg | Freq: Once | INTRAMUSCULAR | Status: AC
  Administered 2019-09-14: 0.5 mg via INTRAVENOUS
  Filled 2019-09-14: qty 1

## 2019-09-14 MED ORDER — SODIUM CHLORIDE 0.9 % IV SOLN: INTRAVENOUS | Status: DC

## 2019-09-14 MED ORDER — PHENYLEPHRINE HCL (PRESSORS) 10 MG/ML IV SOLN
INTRAVENOUS | Status: DC | PRN
  Administered 2019-09-14 (×4): 80 ug via INTRAVENOUS
  Administered 2019-09-14: 40 ug via INTRAVENOUS
  Administered 2019-09-14 (×3): 80 ug via INTRAVENOUS

## 2019-09-14 MED ORDER — SODIUM CHLORIDE 0.9 % IV SOLN
INTRAVENOUS | Status: DC | PRN
  Administered 2019-09-14: 13:00:00 via INTRAVENOUS

## 2019-09-14 SURGICAL SUPPLY — 25 items

## 2019-09-14 NOTE — Plan of Care (Signed)

## 2019-09-14 NOTE — Transfer of Care (Signed)
Immediate Anesthesia Transfer of Care Note  Patient: Alexandra Morrison  Procedure(s) Performed: ESOPHAGOGASTRODUODENOSCOPY (EGD) WITH PROPOFOL (N/A ) COLONOSCOPY WITH PROPOFOL (N/A )  Patient Location: Endoscopy Unit  Anesthesia Type:MAC  Level of Consciousness: awake, alert  and oriented  Airway & Oxygen Therapy: Patient Spontanous Breathing and Patient connected to nasal cannula oxygen  Post-op Assessment: Report given to RN, Post -op Vital signs reviewed and stable and Patient moving all extremities X 4  Post vital signs: Reviewed and stable  Last Vitals:  Vitals Value Taken Time  BP 85/36 09/14/19 1413  Temp    Pulse 83 09/14/19 1413  Resp 21 09/14/19 1413  SpO2 100 % 09/14/19 1413  Vitals shown include unvalidated device data.  Last Pain:  Vitals:   09/14/19 1240  TempSrc: Oral  PainSc: 0-No pain         Complications: No apparent anesthesia complications

## 2019-09-14 NOTE — Progress Notes (Signed)
RN walked into the room to round on pt and she was dressed up and stating that she was going to call a cab and go home. Tried to reason with pt and let her know about leaving AMA pt AOx4 and coherent atm. MD notified and Husband aware.

## 2019-09-14 NOTE — Progress Notes (Signed)
Pt got back from endo around 1420 and since then has been very SOB and tearful and in a panic, pt placed on 2L O2 sats 98% will notify MD

## 2019-09-14 NOTE — Brief Op Note (Signed)
On EGD, clean-based anastomotic ulcer with small amount of oozing seen adjacent to it that stopped spontaneously. On colonoscopy sigmoid diverticulosis and internal hemorrhoids without active bleeding or blood products seen. Anastomotic ulcer likely due to NSAIDs. Continue PPI IV Q 12 hours until discharge and then change to PPI PO BID for 3 months and then QD indefinitely. Avoid NSAIDs. D/W Dr. Verlon Au, patient, and patient's husband. Sips/ice chips now and then clear liquid diet this evening. Soft food tomorrow ok if stable and home tomorrow if tolerating POs. Will f/u.

## 2019-09-14 NOTE — Progress Notes (Signed)
RN called pt husband and updated him on the situation let him speak to pt to see if he could calm her down. Pt stated to her husband " you always bring me places and leave me here. Screw you I am going home" and hung  Up on her husband. Pt then asked RN to remove IV from her arm so she can leave. Will have pt sign AMA papers.

## 2019-09-14 NOTE — Anesthesia Preprocedure Evaluation (Signed)
Anesthesia Evaluation  Patient identified by MRN, date of birth, ID band Patient awake    Reviewed: Allergy & Precautions, NPO status , Patient's Chart, lab work & pertinent test results  History of Anesthesia Complications Negative for: history of anesthetic complications  Airway Mallampati: III  TM Distance: >3 FB Neck ROM: Full  Mouth opening: Limited Mouth Opening  Dental  (+) Teeth Intact, Dental Advisory Given, Chipped   Pulmonary former smoker,    breath sounds clear to auscultation       Cardiovascular + Peripheral Vascular Disease   Rhythm:Regular Rate:Normal     Neuro/Psych PSYCHIATRIC DISORDERS Anxiety Bipolar Disorder negative neurological ROS     GI/Hepatic negative GI ROS, Neg liver ROS,   Endo/Other  Hypothyroidism   Renal/GU negative Renal ROS     Musculoskeletal  (+) Arthritis ,   Abdominal   Peds  Hematology negative hematology ROS (+) anemia ,   Anesthesia Other Findings   Reproductive/Obstetrics                             Anesthesia Physical  Anesthesia Plan  ASA: II  Anesthesia Plan: MAC   Post-op Pain Management:    Induction:   PONV Risk Score and Plan: 2 and Treatment may vary due to age or medical condition, Ondansetron and Propofol infusion  Airway Management Planned: Natural Airway  Additional Equipment: None  Intra-op Plan:   Post-operative Plan: Extubation in OR  Informed Consent: I have reviewed the patients History and Physical, chart, labs and discussed the procedure including the risks, benefits and alternatives for the proposed anesthesia with the patient or authorized representative who has indicated his/her understanding and acceptance.     Dental advisory given  Plan Discussed with: CRNA  Anesthesia Plan Comments:         Anesthesia Quick Evaluation

## 2019-09-14 NOTE — Progress Notes (Signed)
Hospitalist progress note   Alexandra Morrison [id number] DOB: 1945/11/19 DOA: 09/12/2019  PCP: Orpah Melter, MD   Narrative:  73 year old Caucasian female bilateral osteoarthritis status post knee repairs 2018 2019, cervical stenosis status post procedure 2017 bariatric surgery 2006, bipolar Admitted with rectal bleeding X 48 hours prior to admission in addition to S OB Assessment & Plan: Probable upper GI bleed, prior sigmoid diverticulosis 2016 prior GOP 2006-transfuse 1 unit PRBC 11/21 giving 2 more units today as hemoglobin 6.9 continue Protonix IV twice daily-getting bowel prep later on today can continue saline 100 cc/h hemoglobin is-->up to 10.7 and stabilized without further bleeding-for scope today  Management of back pain may be an issue going forward as she may not be able to use NSAIDs-added Flector patch-has had some improvement with the same and pain is not too bad  Lumbar degenerative disc disease on NSAIDs having mainly back and hip pain-I have explained to her we may have some challenges with pain control-continue morphine 2 mg every 6 hourly  Osteoarthritis-as above  Bipolar,?  ADHD continue Xanax 0.5 3 times daily Adderall 30 twice daily Wellbutrin 4:50 AM trazodone 100 p.m Depakote 1000 every morning.  Hypothyroid continue Synthroid 50 mcg daily  Subjective: Awake coherent no distress brushing teeth at the bedside N.p.o. for procedure no further dark tarry stool no nausea no vomiting Consultants:   GI Procedures:   None Antimicrobials:   No Data Reviewed:  Labs hemoglobin down from admissionBaseline usually around 10 was 8.4 on admit-->6.9-->13-->10.7 Platelet down to 139 white count normal BUN/creatinine down from admission 22/0.9-->18/0.8 Radiology Studies:   Objective: Vitals:   09/13/19 1339 09/13/19 1609 09/13/19 2150 09/14/19 0528  BP: (!) 98/45 (!) 121/44 (!) 117/48 (!) 120/46  Pulse: 67 73 70 72  Resp: 16 16 17 17   Temp: 98 F (36.7 C) 98.3  F (36.8 C) 98.2 F (36.8 C) 98.3 F (36.8 C)  TempSrc: Oral Oral Oral Oral  SpO2: 96% 99% 97% 97%  Weight:    58.1 kg  Height:        Intake/Output Summary (Last 24 hours) at 09/14/2019 0824 Last data filed at 09/13/2019 2240 Gross per 24 hour  Intake 1550 ml  Output -  Net 1550 ml   Filed Weights   09/12/19 1554 09/13/19 0453 09/14/19 0528  Weight: 61.7 kg 60 kg 58.1 kg    Examination:   Alert oriented no distress EOMI NCAT Chest clear Abdomen soft  Scheduled Meds: . amphetamine-dextroamphetamine  30 mg Oral BID WC  . buPROPion  450 mg Oral Q breakfast  . diclofenac  1 patch Transdermal BID  . divalproex  1,000 mg Oral Q breakfast  . levothyroxine  50 mcg Oral Q breakfast  . pantoprazole (PROTONIX) IV  40 mg Intravenous Q12H  . pravastatin  10 mg Oral q1800  . sodium chloride flush  10-40 mL Intracatheter Q12H   Continuous Infusions: . sodium chloride Stopped (09/13/19 0453)  . sodium chloride 20 mL/hr at 09/14/19 0604     LOS: 2 days   Time spent: [place], MD [facility]  09/14/2019, 8:24 AM

## 2019-09-14 NOTE — Op Note (Signed)
[facility] Patient Name: Alexandra Morrison Procedure Date : 09/14/2019 MRN: [id number] Attending MD: Lear Ng , MD Date of Birth: 1946/09/01 CSN: [id number] Age: 73 Admit Type: Inpatient Procedure:                Upper GI endoscopy Indications:              Active gastrointestinal bleeding, Hematochezia Providers:                Lear Ng, MD, Baird Cancer, RN, Marguerita Merles, Technician Referring MD:             hospital team Medicines:                Propofol per Anesthesia, Monitored Anesthesia Care Complications:            No immediate complications. Estimated Blood Loss:     Estimated blood loss was minimal. Procedure:                Pre-Anesthesia Assessment:                           - Prior to the procedure, a History and Physical                            was performed, and patient medications and                            allergies were reviewed. The patient's tolerance of                            previous anesthesia was also reviewed. The risks                            and benefits of the procedure and the sedation                            options and risks were discussed with the patient.                            All questions were answered, and informed consent                            was obtained. Prior Anticoagulants: The patient has                            taken no previous anticoagulant or antiplatelet                            agents. ASA Grade Assessment: III - A patient with                            severe systemic disease. After reviewing the risks  and benefits, the patient was deemed in                            satisfactory condition to undergo the procedure.                           After obtaining informed consent, the endoscope was                            passed under direct vision. Throughout the                            procedure, the  patient's blood pressure, pulse, and                            oxygen saturations were monitored continuously. The                            [id number]-[id number] Olympus gastroscope was                            introduced through the mouth, and advanced to the                            afferent and efferent jejunal loops. The upper GI                            endoscopy was accomplished without difficulty. The                            patient tolerated the procedure well. Scope In: Scope Out: Findings:      The examined esophagus was normal.      The Z-line was regular and was found 30 cm from the incisors.      Evidence of a Roux-en-Y gastrojejunostomy was found. The gastrojejunal       anastomosis was characterized by ulceration. This was traversed. The       pouch-to-jejunum limb was characterized by ulceration. The jejunojejunal       anastomosis was characterized by healthy appearing mucosa.      The examined jejunum was normal.      One oozing cratered gastric ulcer was found at the anastomosis. The       lesion was 6 mm in largest dimension. Impression:               - Normal esophagus.                           - Z-line regular, 30 cm from the incisors.                           - Roux-en-Y gastrojejunostomy with gastrojejunal                            anastomosis characterized by ulceration.                           -  Normal examined jejunum.                           - Oozing gastric ulcer.                           - No specimens collected. Recommendation:           - Clear liquid diet.                           - Observe patient's clinical course.                           - Advance diet as tolerated. Procedure Code(s):        --- Professional ---                           [phone], Esophagogastroduodenoscopy, flexible,                            transoral; diagnostic, including collection of                            specimen(s) by brushing or washing, when  performed                            (separate procedure) Diagnosis Code(s):        --- Professional ---                           K92.2, Gastrointestinal hemorrhage, unspecified                           K92.1, Melena (includes Hematochezia)                           K25.4, Chronic or unspecified gastric ulcer with                            hemorrhage                           Z98.0, Intestinal bypass and anastomosis status CPT copyright 2019 [employer]. All rights reserved. The codes documented in this report are preliminary and upon coder review may  be revised to meet current compliance requirements. Lear Ng, MD 09/14/2019 2:14:05 PM This report has been signed electronically. Number of Addenda: 0

## 2019-09-14 NOTE — Op Note (Signed)
[facility] Patient Name: Alexandra Morrison Procedure Date : 09/14/2019 MRN: [id number] Attending MD: Lear Ng , MD Date of Birth: 13-Mar-1946 CSN: [id number] Age: 73 Admit Type: Inpatient Procedure:                Colonoscopy Indications:              Evaluation of unexplained GI bleeding presenting                            with Hematochezia, Last colonoscopy: 2016 Providers:                Lear Ng, MD, Baird Cancer, RN, Marguerita Merles, Technician Referring MD:             hospital team Medicines:                Propofol per Anesthesia, Monitored Anesthesia Care Complications:            No immediate complications. Estimated Blood Loss:     Estimated blood loss: none. Procedure:                Pre-Anesthesia Assessment:                           - Prior to the procedure, a History and Physical                            was performed, and patient medications and                            allergies were reviewed. The patient's tolerance of                            previous anesthesia was also reviewed. The risks                            and benefits of the procedure and the sedation                            options and risks were discussed with the patient.                            All questions were answered, and informed consent                            was obtained. Prior Anticoagulants: The patient has                            taken no previous anticoagulant or antiplatelet                            agents. ASA Grade Assessment: III - A patient with  severe systemic disease. After reviewing the risks                            and benefits, the patient was deemed in                            satisfactory condition to undergo the procedure.                           After obtaining informed consent, the colonoscope                            was passed under direct vision.  Throughout the                            procedure, the patient's blood pressure, pulse, and                            oxygen saturations were monitored continuously. The                            [id number]-[id number] Olympus pediatric colonscope                            was introduced through the anus and advanced to the                            the cecum, identified by appendiceal orifice and                            ileocecal valve. The colonoscopy was somewhat                            difficult due to a tortuous colon, the patient's                            discomfort during the procedure and fair prep.                            Successful completion of the procedure was aided by                            increasing the dose of sedation medication,                            straightening and shortening the scope to obtain                            bowel loop reduction and lavage. The patient                            tolerated the procedure fairly well. The quality of  the bowel preparation was fair and fair but                            repeated irrigation led to a good and adequate                            prep. The terminal ileum, the appendiceal orifice                            and the rectum were photographed. Scope In: 1:38:33 PM Scope Out: 2:04:57 PM Scope Withdrawal Time: 0 hours 16 minutes 20 seconds  Total Procedure Duration: 0 hours 26 minutes 24 seconds  Findings:      The perianal and digital rectal examinations were normal.      Scattered small and large-mouthed diverticula were found in the sigmoid       colon.      Internal hemorrhoids were found during retroflexion. The hemorrhoids       were small and Grade I (internal hemorrhoids that do not prolapse).      The terminal ileum appeared normal.      There is no endoscopic evidence of bleeding in the entire colon. Impression:               - Preparation of the colon  was fair.                           - Diverticulosis in the sigmoid colon.                           - Internal hemorrhoids.                           - The examined portion of the ileum was normal.                           - No specimens collected. Recommendation:           - Patient has a contact number available for                            emergencies. The signs and symptoms of potential                            delayed complications were discussed with the                            patient. Return to normal activities tomorrow.                            Written discharge instructions were provided to the                            patient.                           - High fiber diet.                           -  Repeat colonoscopy is not recommended for                            screening purposes. Procedure Code(s):        --- Professional ---                           [phone], Colonoscopy, flexible; diagnostic, including                            collection of specimen(s) by brushing or washing,                            when performed (separate procedure) Diagnosis Code(s):        --- Professional ---                           K92.1, Melena (includes Hematochezia)                           K64.0, First degree hemorrhoids                           K57.30, Diverticulosis of large intestine without                            perforation or abscess without bleeding CPT copyright 2019 American [employer] Association. All rights reserved. The codes documented in this report are preliminary and upon coder review may  be revised to meet current compliance requirements. Lear Ng, MD 09/14/2019 2:18:35 PM This report has been signed electronically. Number of Addenda: 0

## 2019-09-15 LAB — BASIC METABOLIC PANEL
Anion gap: 9 (ref 5–15)
BUN: 5 mg/dL — ABNORMAL LOW (ref 8–23)
CO2: 22 mmol/L (ref 22–32)
Calcium: 8.5 mg/dL — ABNORMAL LOW (ref 8.9–10.3)
Chloride: 113 mmol/L — ABNORMAL HIGH (ref 98–111)
Creatinine, Ser: 0.87 mg/dL (ref 0.44–1.00)
GFR calc Af Amer: >60 mL/min (ref >60)
GFR calc non Af Amer: >60 mL/min (ref >60)
Glucose, Bld: 100 mg/dL — ABNORMAL HIGH (ref 70–99)
Potassium: 3.6 mmol/L (ref 3.5–5.1)
Sodium: 144 mmol/L (ref 135–145)

## 2019-09-15 LAB — CBC WITH DIFFERENTIAL/PLATELET
Abs Immature Granulocytes: 0.02 10*3/uL (ref 0.00–0.07)
Basophils Absolute: 0 10*3/uL (ref 0.0–0.1)
Basophils Relative: 1 %
Eosinophils Absolute: 0.1 10*3/uL (ref 0.0–0.5)
Eosinophils Relative: 2 %
HCT: 35.1 % — ABNORMAL LOW (ref 36.0–46.0)
Hemoglobin: 11.6 g/dL — ABNORMAL LOW (ref 12.0–15.0)
Immature Granulocytes: 0 %
Lymphocytes Relative: 22 %
Lymphs Abs: 1.3 10*3/uL (ref 0.7–4.0)
MCH: 31.8 pg (ref 26.0–34.0)
MCHC: 33 g/dL (ref 30.0–36.0)
MCV: 96.2 fL (ref 80.0–100.0)
Monocytes Absolute: 0.6 10*3/uL (ref 0.1–1.0)
Monocytes Relative: 10 %
Neutro Abs: 4.1 10*3/uL (ref 1.7–7.7)
Neutrophils Relative %: 65 %
Platelets: 161 10*3/uL (ref 150–400)
RBC: 3.65 MIL/uL — ABNORMAL LOW (ref 3.87–5.11)
RDW: 15.5 % (ref 11.5–15.5)
WBC: 6.2 10*3/uL (ref 4.0–10.5)
nRBC: 0 % (ref 0.0–0.2)

## 2019-09-15 LAB — GLUCOSE, CAPILLARY: Glucose-Capillary: 76 mg/dL (ref 70–99)

## 2019-09-15 MED ORDER — PANTOPRAZOLE SODIUM 40 MG PO TBEC: 40.0000 mg | DELAYED_RELEASE_TABLET | Freq: Two times a day (BID) | ORAL | 3 refills | Status: AC

## 2019-09-15 MED ORDER — DICLOFENAC EPOLAMINE 1.3 % EX PTCH: 1.0000 | MEDICATED_PATCH | Freq: Two times a day (BID) | CUTANEOUS | 0 refills | Status: DC

## 2019-09-15 NOTE — Anesthesia Postprocedure Evaluation (Signed)
Anesthesia Post Note  Patient: Alexandra Morrison  Procedure(s) Performed: ESOPHAGOGASTRODUODENOSCOPY (EGD) WITH PROPOFOL (N/A ) COLONOSCOPY WITH PROPOFOL (N/A )     Patient location during evaluation: PACU Anesthesia Type: MAC Level of consciousness: awake and alert Pain management: pain level controlled Vital Signs Assessment: post-procedure vital signs reviewed and stable Respiratory status: spontaneous breathing Cardiovascular status: stable Anesthetic complications: no Comments: Called from floor that patient was upset and felt bad from anesthesia. Discussed with RN that propofol should be long out of her system and that her primary service should come evaluate her. She said hospitalist service had been paged.    Last Vitals:  Vitals:   09/14/19 2334 09/15/19 0535  BP: (!) 106/43 97/60  Pulse: 64 (!) 57  Resp: 17 15  Temp: 36.8 C 36.7 C  SpO2: 97% 95%    Last Pain:  Vitals:   09/15/19 0535  TempSrc: Oral  PainSc:                  Nolon Nations

## 2019-09-15 NOTE — Care Management Important Message (Signed)
Important Message  Patient Details  Name: Alexandra Morrison MRN: [id number] Date of Birth: [birth year]   Medicare Important Message Given:  Yes     Orbie Pyo 09/15/2019, 2:22 PM

## 2019-09-15 NOTE — Discharge Summary (Signed)
Physician Discharge Summary  Alexandra Morrison [id number] DOB: 09-28-1946 DOA: 09/12/2019  PCP: Camille Bal, PA-C  Admit date: 09/12/2019 Discharge date: 09/15/2019  Time spent: 35 minutes  Recommendations for Outpatient Follow-up:  1. Needs Chem-12 and CBC in about 1 week to 2 weeks 2. Avoid aspirin nonsteroidals turmeric fish oil for about 1 week 3. Require follow-up with pain clinic and pain physician 4. Outpatient follow-up with GI as directed  Discharge Diagnoses:  Principal Problem:   GI bleed Active Problems:   Hypothyroidism   Spinal stenosis   Symptomatic anemia   Macrocytosis   HLD (hyperlipidemia)   Anxiety   DDD (degenerative disc disease), lumbar   Discharge Condition: Improved  Diet recommendation: Heart healthy  Filed Weights   09/14/19 0528 09/14/19 1240 09/15/19 0500  Weight: 58.1 kg 58.1 kg 60.8 kg    History of present illness:  73 year old Caucasian female bilateral osteoarthritis status post knee repairs 2018 2019, cervical stenosis status post procedure 2017 bariatric surgery 2006, bipolar Admitted with rectal bleeding X 48 hours prior to admission in addition to [facility] Course:  Probable upper GI bleed, prior sigmoid diverticulosis 2016 prior GOP 2006-transfuse 1 unit PRBC 11/21 giving 2 more units today as hemoglobin 6.9 continue Protonix IV twice daily- Colonoscopy and endoscopy performed this admission showing mainly sigmoid diverticulosis internal hemorrhoids on colonoscopy however on EGD clean-based anastomotic ulcer with small amount of oozing that spontaneously stopped GI recommended p.o. PPI twice daily for 3 months then daily and avoid aspirin NSAIDs if possible  Management of back pain may be an issue going forward as she may not be able to use NSAIDs-added Flector patch-has had some improvement with the same and pain is not too bad  Lumbar degenerative disc disease on NSAIDs having mainly back and hip pain-I have  explained to her we may have some challenges with pain control-we transitioned her to Flector patch which seemed to do well I resume the rest of her home meds on discharge and she should follow-up with her pain physician for what appears to be injections in the next week she is stabilized from my perspective for the same  Osteoarthritis-as above  Bipolar,?  ADHD continue Xanax 0.5 3 times daily Adderall 30 twice daily Wellbutrin 4:50 AM trazodone 100 p.m Depakote 1000 every morning.  Hypothyroid continue Synthroid 50 mcg daily  Procedures:  Endoscopy colonoscopy 11/23  Consultations:  GI  Discharge Exam: Vitals:   09/14/19 2334 09/15/19 0535  BP: (!) 106/43 97/60  Pulse: 64 (!) 57  Resp: 17 15  Temp: 98.3 F (36.8 C) 98 F (36.7 C)  SpO2: 97% 95%    General: Awake alert coherent no distress EOMI NCAT no focal deficit less agitated than prior Cardiovascular: S1-S2 no murmur rub or gallop Respiratory: Clinically clear no added sound Abdomen soft nontender no rebound no guarding No lower extremity edema  Discharge Instructions    Allergies as of 09/15/2019   No Known Allergies     Medication List    STOP taking these medications   aspirin 81 MG chewable tablet   aspirin EC 81 MG tablet     TAKE these medications   acetaminophen 500 MG tablet Commonly known as: TYLENOL Take 1,000 mg by mouth See admin instructions. Take two tablets (1000 mg) by mouth every morning, may also take two tablets (1000 mg) every 6 hours as needed for pain   alendronate 70 MG tablet Commonly known as: FOSAMAX Take 70 mg by  mouth every Monday. Take with a full glass of water on an empty stomach.   ALPRAZolam 0.5 MG tablet Commonly known as: XANAX Take 0.5 mg by mouth 3 (three) times daily as needed for anxiety.   amphetamine-dextroamphetamine 30 MG tablet Commonly known as: ADDERALL Take 30 mg by mouth 2 (two) times daily with breakfast and lunch.   b complex vitamins  tablet Take 1 tablet by mouth daily after supper.   Benadryl Allergy 25 MG tablet Generic drug: diphenhydrAMINE Take 25 mg by mouth 2 (two) times daily as needed for allergies.   BENGAY EX Apply 1 application topically 2 (two) times daily as needed (pain).   Biotin 5000 MCG Tabs Take 5,000 mcg by mouth daily.   buPROPion 300 MG 24 hr tablet Commonly known as: WELLBUTRIN XL Take 300 mg by mouth daily with breakfast. Take with a 150 mg tablet for a total dose of 450 mg   buPROPion 150 MG 24 hr tablet Commonly known as: WELLBUTRIN XL Take 150 mg by mouth daily with breakfast. Take with a 300 mg tablet for a total dose of 450 mg   calcium carbonate 600 MG tablet Commonly known as: OS-CAL Take 600 mg by mouth 2 (two) times daily.   Cinnamon 500 MG Tabs Take 500 mg by mouth daily after supper.   Coenzyme Q10 300 MG Caps Take 300 mg by mouth daily with lunch.   diclofenac 1.3 % Ptch Commonly known as: FLECTOR Place 1 patch onto the skin 2 (two) times daily.   divalproex 500 MG 24 hr tablet Commonly known as: DEPAKOTE ER Take 1,000 mg by mouth daily with breakfast.   docusate sodium 100 MG capsule Commonly known as: COLACE Take 1 capsule (100 mg total) by mouth 2 (two) times daily.   ferrous sulfate 325 (65 FE) MG tablet Take 325 mg by mouth at bedtime.   Fish Oil 1000 MG Caps Take 1,000 mg by mouth every morning.   furosemide 20 MG tablet Commonly known as: LASIX Take 20 mg by mouth 2 (two) times daily as needed for fluid or edema.   GLUCOSAMINE-CHONDROITIN PO Take 1 capsule by mouth daily.   HYDROcodone-acetaminophen 7.5-325 MG tablet Commonly known as: NORCO Take 1-2 tablets by mouth every 6 (six) hours as needed for severe pain (pain score 7-10).   levothyroxine 50 MCG tablet Commonly known as: SYNTHROID Take 50 mcg by mouth daily with breakfast.   lovastatin 10 MG tablet Commonly known as: MEVACOR Take 10 mg by mouth daily with supper.   Magnesium 500  MG Tabs Take 500 mg by mouth daily after supper.   methocarbamol 500 MG tablet Commonly known as: ROBAXIN Take 1 tablet (500 mg total) by mouth every 6 (six) hours as needed for muscle spasms. What changed: when to take this   multivitamin with minerals tablet Take 1 tablet by mouth daily with lunch.   nabumetone 500 MG tablet Commonly known as: RELAFEN Take 500 mg by mouth 2 (two) times daily as needed (pain).   niacin 500 MG tablet Take 500 mg by mouth daily after lunch.   pantoprazole 40 MG tablet Commonly known as: Protonix Take 1 tablet (40 mg total) by mouth 2 (two) times daily.   phenylephrine 10 MG Tabs tablet Commonly known as: SUDAFED PE Take 10 mg by mouth 3 times/day as needed-between meals & bedtime (sinus congestion).   polyethylene glycol 17 g packet Commonly known as: MIRALAX / GLYCOLAX Take 17 g by mouth daily as  needed for mild constipation.   PROBIOTIC DAILY PO Take 1 capsule by mouth daily after supper.   Systane 0.4-0.3 % Gel ophthalmic gel Generic drug: Polyethyl Glycol-Propyl Glycol Place 1 application into both eyes daily as needed (dry eyes/irritation/allergies).   traZODone 100 MG tablet Commonly known as: DESYREL Take 100 mg by mouth at bedtime as needed for sleep.   Turmeric 500 MG Caps Take 500 mg by mouth 2 (two) times daily.   vitamin B-12 1000 MCG tablet Commonly known as: CYANOCOBALAMIN Take 1,000 mcg by mouth daily after supper.   Vitamin D 50 MCG (2000 UT) Caps Take 2,000 Units by mouth daily after lunch.      No Known Allergies    The results of significant diagnostics from this hospitalization (including imaging, microbiology, ancillary and laboratory) are listed below for reference.    Significant Diagnostic Studies: Dg Chest 2 View  Result Date: 09/12/2019 CLINICAL DATA:  Dyspnea EXAM: CHEST - 2 VIEW COMPARISON:  11/22/2015 chest radiograph. FINDINGS: Bilateral posterior cervical spine fusion hardware. Bilateral  shoulder arthroplasties. Stable cardiomediastinal silhouette with normal heart size. No pneumothorax. No pleural effusion. Lungs appear clear, with no acute consolidative airspace disease and no pulmonary edema. Long segment moderate S-shaped thoracolumbar scoliosis. IMPRESSION: No active cardiopulmonary disease. Electronically Signed   By: Ilona Sorrel M.D.   On: 09/12/2019 16:34   Ct Abdomen Pelvis W Contrast  Result Date: 09/12/2019 CLINICAL DATA:  Bright red blood per rectum. EXAM: CT ABDOMEN AND PELVIS WITH CONTRAST TECHNIQUE: Multidetector CT imaging of the abdomen and pelvis was performed using the standard protocol following bolus administration of intravenous contrast. CONTRAST:  181mL OMNIPAQUE IOHEXOL 300 MG/ML  SOLN COMPARISON:  None. FINDINGS: Lower chest: The lung bases are clear. The heart size is normal. Hepatobiliary: The liver is normal. Normal gallbladder.There is no biliary ductal dilation. Pancreas: Normal contours without ductal dilatation. No peripancreatic fluid collection. Spleen: No splenic laceration or hematoma. Adrenals/Urinary Tract: --Adrenal glands: No adrenal hemorrhage. --Right kidney/ureter: No hydronephrosis or perinephric hematoma. --Left kidney/ureter: No hydronephrosis or perinephric hematoma. --Urinary bladder: Unremarkable. Stomach/Bowel: --Stomach/Duodenum: The patient is status post prior gastric bypass. There is a moderate-sized hiatal hernia. --Small bowel: No dilatation or inflammation. --Colon: No focal abnormality. --Appendix: Not visualized. No right lower quadrant inflammation or free fluid. Vascular/Lymphatic: Atherosclerotic calcification is present within the non-aneurysmal abdominal aorta, without hemodynamically significant stenosis. --No retroperitoneal lymphadenopathy. --No mesenteric lymphadenopathy. --No pelvic or inguinal lymphadenopathy. Reproductive: Unremarkable Other: No ascites or free air. The abdominal wall is normal. Musculoskeletal. There are  advanced degenerative changes throughout the lumbar spine with grade 1-2 anterolisthesis of L4 on L5 which is presumably degenerative. There is likely multilevel spinal canal stenosis. IMPRESSION: 1. No acute abdominopelvic abnormality. No findings to explain the patient's GI bleeding. 2. Moderate-sized hiatal hernia. The patient is status post prior gastric bypass. 3. Advanced degenerative changes of the lumbar spine resulting in multilevel spinal canal stenosis. Aortic Atherosclerosis (ICD10-I70.0). Electronically Signed   By: Constance Holster M.D.   On: 09/12/2019 21:39    Microbiology: Recent Results (from the past 240 hour(s))  SARS CORONAVIRUS 2 (TAT 6-24 HRS) Nasopharyngeal Nasopharyngeal Swab     Status: None   Collection Time: 09/12/19  5:42 PM   Specimen: Nasopharyngeal Swab  Result Value Ref Range Status   SARS Coronavirus 2 NEGATIVE NEGATIVE Final    Comment: (NOTE) SARS-CoV-2 target nucleic acids are NOT DETECTED. The SARS-CoV-2 RNA is generally detectable in upper and lower respiratory specimens during the acute phase  of infection. Negative results do not preclude SARS-CoV-2 infection, do not rule out co-infections with other pathogens, and should not be used as the sole basis for treatment or other patient management decisions. Negative results must be combined with clinical observations, patient history, and epidemiological information. The expected result is Negative. Fact Sheet for Patients: SugarRoll.be Fact Sheet for Healthcare Providers: https://www.woods-mathews.com/ This test is not yet approved or cleared by the [place] FDA and  has been authorized for detection and/or diagnosis of SARS-CoV-2 by FDA under an Emergency Use Authorization (EUA). This EUA will remain  in effect (meaning this test can be used) for the duration of the COVID-19 declaration under Section 56 4(b)(1) of the Act, 21 U.S.C. section  360bbb-3(b)(1), unless the authorization is terminated or revoked sooner. Performed at [facility], [place] [address], [place], [place] [address]      Labs: Basic Metabolic Panel: Recent Labs  Lab 09/12/19 1536 09/13/19 0208  NA 138 138  K 3.8 3.6  CL 103 107  CO2 25 24  GLUCOSE 173* 103*  BUN 22 18  CREATININE 0.92 0.83  CALCIUM 8.9 8.4*   Liver Function Tests: Recent Labs  Lab 09/12/19 1536  AST 23  ALT 19  ALKPHOS 33*  BILITOT 0.5  PROT 5.5*  ALBUMIN 3.4*   No results for input(s): LIPASE, AMYLASE in the last 168 hours. No results for input(s): AMMONIA in the last 168 hours. CBC: Recent Labs  Lab 09/12/19 1536 09/13/19 0208 09/13/19 1836 09/14/19 0333  WBC 7.9 8.6  --  6.2  NEUTROABS  --   --   --  2.9  HGB 8.4* 6.9* 13.5 10.7*  HCT 25.7* 20.4*  21.0* 39.7 32.1*  MCV 100.8* 96.2  --  94.4  PLT 187 155  --  139*   Cardiac Enzymes: No results for input(s): CKTOTAL, CKMB, CKMBINDEX, TROPONINI in the last 168 hours. BNP: BNP (last 3 results) No results for input(s): BNP in the last 8760 hours.  ProBNP (last 3 results) No results for input(s): PROBNP in the last 8760 hours.  CBG: Recent Labs  Lab 09/15/19 0536  GLUCAP 76       Signed:  Nita Sells MD   [facility] 09/15/2019, 8:02 AM

## 2019-09-15 NOTE — Progress Notes (Signed)
Patient is discharged from room 5C09 at this time Alert and in stable condition. IV site d/c'd and instructions read to patient and spouse with understanding verbalized and all questions answered. Left unit via wheelchair with all belongings at side.

## 2019-09-21 DIAGNOSIS — M47816 Spondylosis without myelopathy or radiculopathy, lumbar region: Secondary | ICD-10-CM | POA: Diagnosis not present

## 2019-09-24 DIAGNOSIS — F3110 Bipolar disorder, current episode manic without psychotic features, unspecified: Secondary | ICD-10-CM | POA: Diagnosis not present

## 2019-11-10 DIAGNOSIS — M47816 Spondylosis without myelopathy or radiculopathy, lumbar region: Secondary | ICD-10-CM | POA: Diagnosis not present

## 2019-11-13 ENCOUNTER — Ambulatory Visit: Payer: Medicare Other | Attending: Internal Medicine

## 2019-11-13 DIAGNOSIS — Z23 Encounter for immunization: Secondary | ICD-10-CM | POA: Insufficient documentation

## 2019-11-13 NOTE — Progress Notes (Signed)
   Covid-19 Vaccination Clinic  Name:  Alexandra Morrison    MRN: [id number] DOB: 05-31-1946  11/13/2019  [name] was observed post Covid-19 immunization for 15 minutes without incidence. She was provided with Vaccine Information Sheet and instruction to access the V-Safe system.   [name] was instructed to call 911 with any severe reactions post vaccine: Marland Kitchen Difficulty breathing  . Swelling of your face and throat  . A fast heartbeat  . A bad rash all over your body  . Dizziness and weakness    Immunizations Administered    Name Date Dose VIS Date Route   Pfizer COVID-19 Vaccine 11/13/2019  1:18 PM 0.3 mL 10/02/2019 Intramuscular   Manufacturer: [place]   Lot: [id number]   [place]: [id number]

## 2019-12-01 ENCOUNTER — Ambulatory Visit: Payer: Medicare Other | Attending: Internal Medicine

## 2019-12-01 DIAGNOSIS — Z23 Encounter for immunization: Secondary | ICD-10-CM | POA: Insufficient documentation

## 2019-12-01 NOTE — Progress Notes (Signed)
   Covid-19 Vaccination Clinic  Name:  Alexandra Morrison    MRN: [id number] DOB: 11-Jul-1946  12/01/2019  Alexandra Morrison was observed post Covid-19 immunization for 15 minutes without incidence. She was provided with Vaccine Information Sheet and instruction to access the V-Safe system.   Alexandra Morrison was instructed to call 911 with any severe reactions post vaccine: Marland Kitchen Difficulty breathing  . Swelling of your face and throat  . A fast heartbeat  . A bad rash all over your body  . Dizziness and weakness    Immunizations Administered    Name Date Dose VIS Date Route   Pfizer COVID-19 Vaccine 12/01/2019 12:48 PM 0.3 mL 10/02/2019 Intramuscular   Manufacturer: [place]   Lot: [id number]   [place]: [id number]

## 2019-12-21 DIAGNOSIS — F3110 Bipolar disorder, current episode manic without psychotic features, unspecified: Secondary | ICD-10-CM | POA: Diagnosis not present

## 2020-03-14 DIAGNOSIS — F3110 Bipolar disorder, current episode manic without psychotic features, unspecified: Secondary | ICD-10-CM | POA: Diagnosis not present

## 2020-03-25 ENCOUNTER — Other Ambulatory Visit: Payer: Self-pay | Admitting: Physician Assistant

## 2020-03-25 ENCOUNTER — Other Ambulatory Visit: Payer: Self-pay | Admitting: Family Medicine

## 2020-03-25 DIAGNOSIS — Z1231 Encounter for screening mammogram for malignant neoplasm of breast: Secondary | ICD-10-CM

## 2020-03-25 DIAGNOSIS — M810 Age-related osteoporosis without current pathological fracture: Secondary | ICD-10-CM

## 2020-04-15 IMAGING — DX DG KNEE 1-2V PORT*L*
2 series · 2 of 2 positions shown · non-contrast
Comparison: None.

CLINICAL DATA: Left knee replacement

EXAM:
PORTABLE LEFT KNEE - 1-2 VIEW

[knee ap]
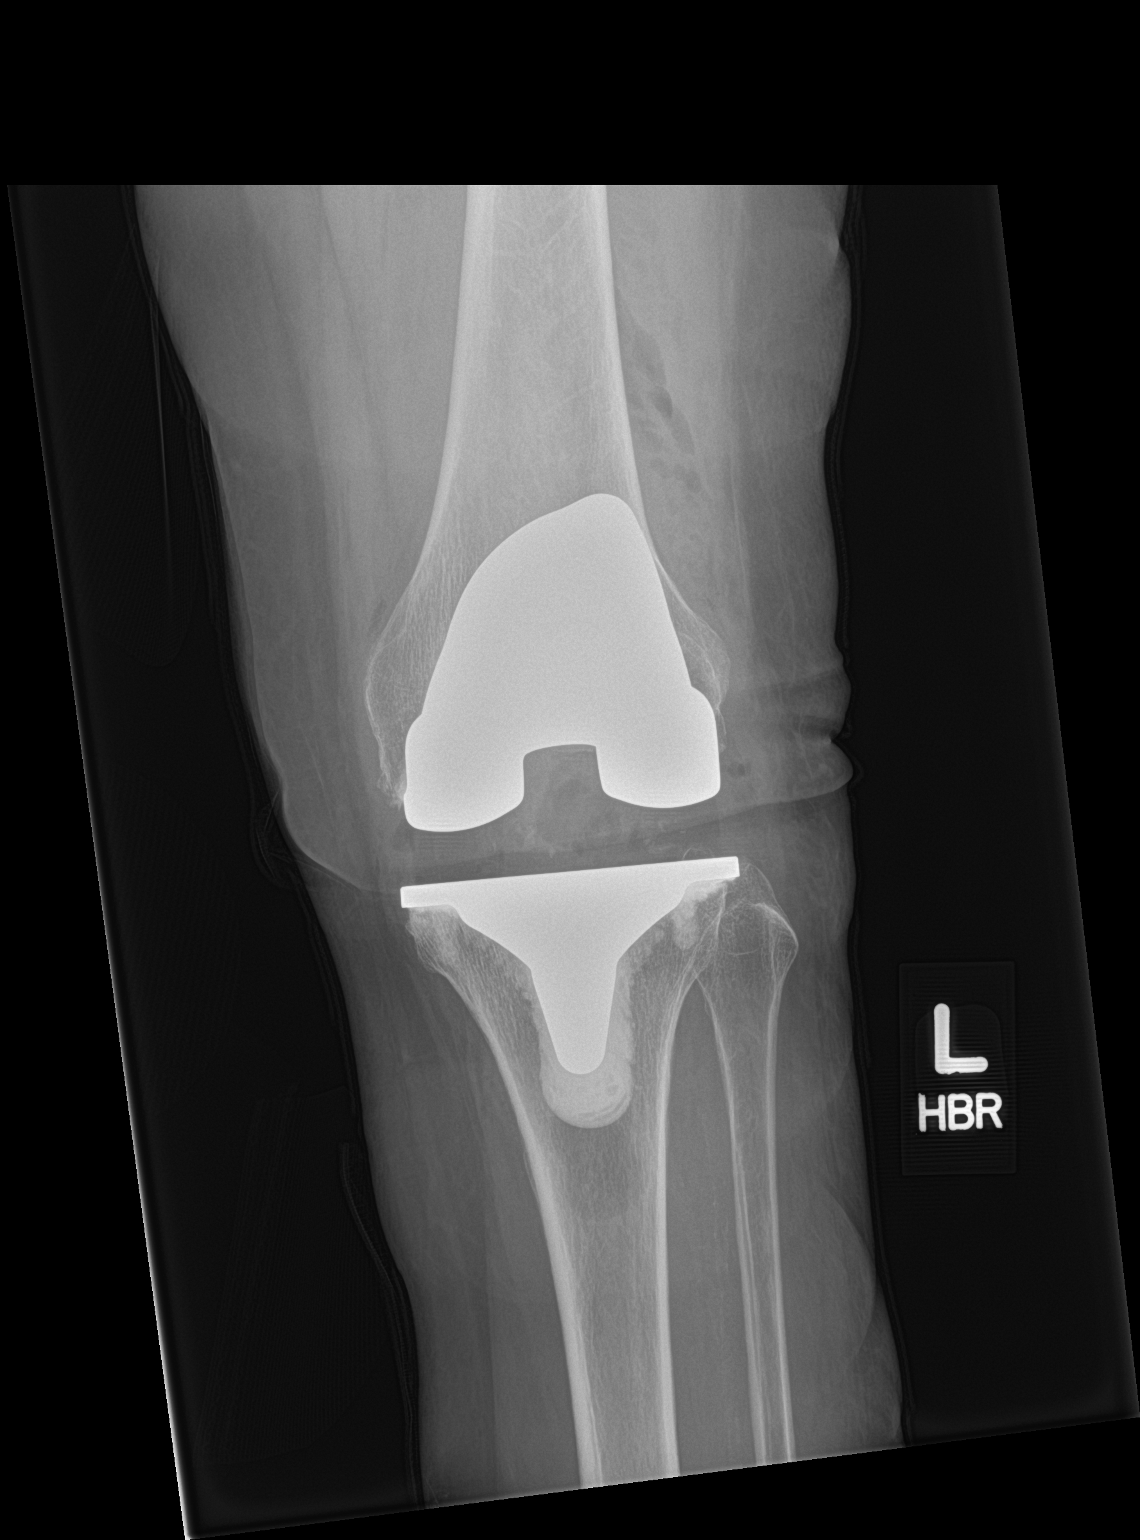

[knee lat]
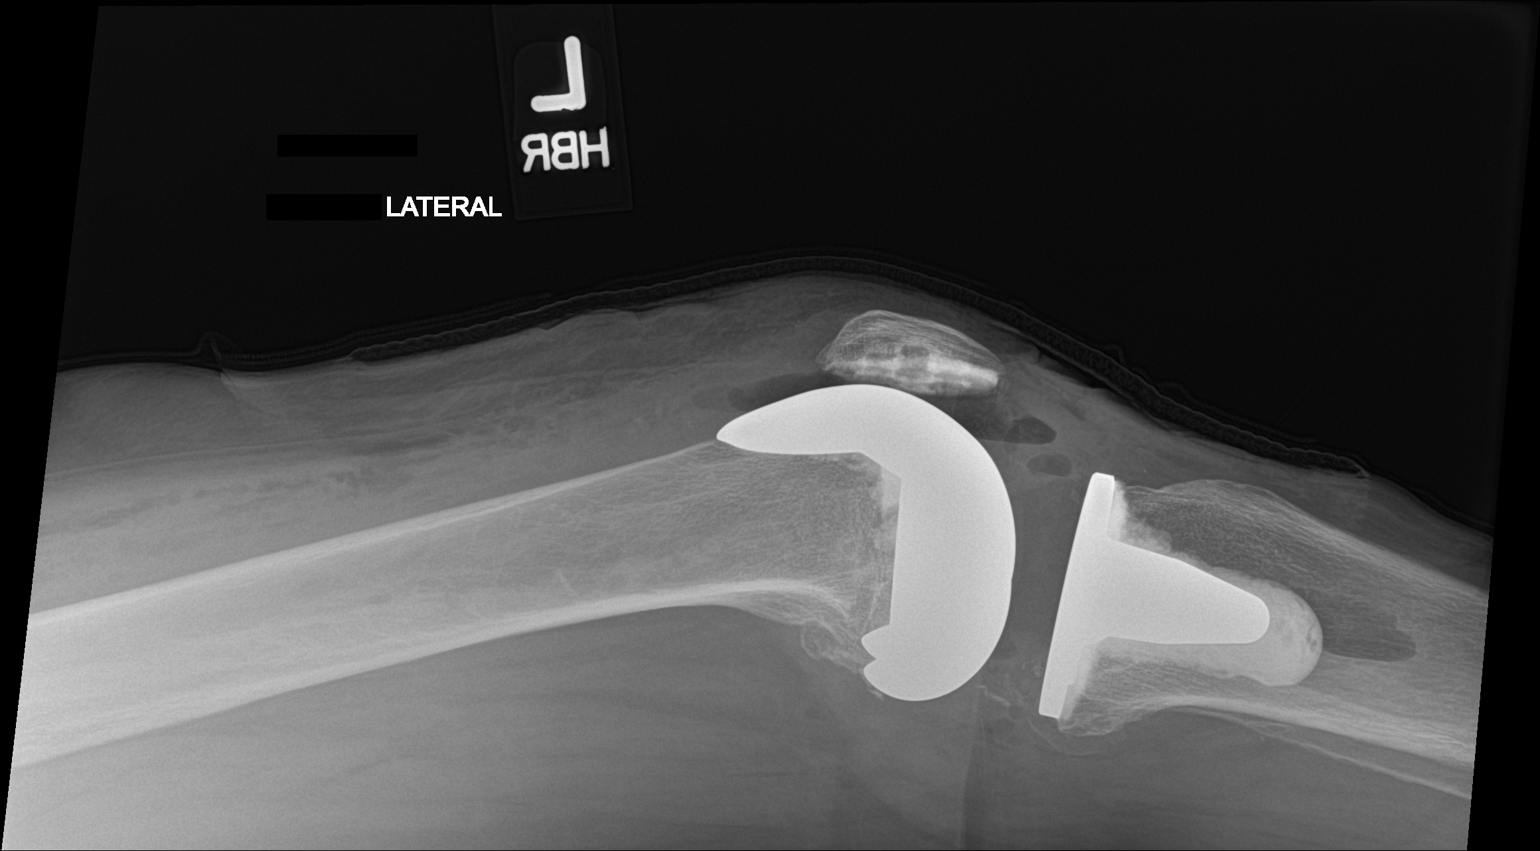

[2 of 2 positions shown; findings below may reference images not displayed]

FINDINGS: Left knee prosthesis is seen in satisfactory position. Postsurgical
changes in the adjacent soft tissues are noted.
IMPRESSION: Status post left knee replacement

## 2020-06-15 ENCOUNTER — Ambulatory Visit
Admission: RE | Admit: 2020-06-15 | Discharge: 2020-06-15 | Disposition: A | Discharge status: Home / Self Care | Payer: Medicare Other | Source: Ambulatory Visit | Attending: Family Medicine | Admitting: Family Medicine

## 2020-06-15 ENCOUNTER — Other Ambulatory Visit: Payer: Self-pay

## 2020-06-15 DIAGNOSIS — Z780 Asymptomatic menopausal state: Secondary | ICD-10-CM | POA: Diagnosis not present

## 2020-06-15 DIAGNOSIS — Z1231 Encounter for screening mammogram for malignant neoplasm of breast: Secondary | ICD-10-CM

## 2020-06-15 DIAGNOSIS — M810 Age-related osteoporosis without current pathological fracture: Secondary | ICD-10-CM

## 2020-06-21 DIAGNOSIS — R42 Dizziness and giddiness: Secondary | ICD-10-CM | POA: Diagnosis not present

## 2020-06-21 DIAGNOSIS — R29818 Other symptoms and signs involving the nervous system: Secondary | ICD-10-CM | POA: Diagnosis not present

## 2020-06-28 ENCOUNTER — Encounter: Payer: Self-pay | Admitting: Neurology

## 2020-07-13 DIAGNOSIS — F3110 Bipolar disorder, current episode manic without psychotic features, unspecified: Secondary | ICD-10-CM | POA: Diagnosis not present

## 2020-07-18 NOTE — Progress Notes (Signed)
NEUROLOGY CONSULTATION NOTE  Alexandra Morrison MRN: [id number] DOB: [birth year]  Referring provider: Carolee Rota, NP Primary care provider: Sammuel Hines, PA-C  Reason for consult:  dizziness  HISTORY OF PRESENT ILLNESS: Alexandra Morrison is a 74 year old right-handed female with history of cervical spinal stenosis with myelopathy s/p C5-T1 decompression and fusion who presents for dizziness.  History supplemented by referring provider's and prior neurologist's notes.    She developed dizziness around June, described as positional spinning sensation.  At that time, she endorsed nasal congestion and otalgia and was treated with amoxicillin for presumed sinusitis and ear infection.  She was prescribed meclizine and symptoms resolved after 2 weeks.  However, she presents for balance problems.  She finds herself veering off when she walks.  She reports multiple falls.  She states that she has numbness in the feet.  These symptoms are chronic.  She reports balance problems since childhood.  She has history of moderate to severe cervical spinal stenosis with cord deformity after presenting with balance problems in 2016 and subsequently underwent C5-T1 decompression and fusion.  She noted at the time that her balance did not improve.  MRI of brain with and without contrast from December 2016 was normal.  She had a NCV-EMG performed in January 2017 which was normal.  She also reports tremors in the hands.  It shakes when holding a glass and makes it difficult to write.  She takes Depakote for Bipolar disorder but states the onset of tremor predates initiating Depakote.  PAST MEDICAL HISTORY: Past Medical History:  Diagnosis Date  . Abdominal aortic atherosclerosis (HCC)    Moderate  . Anxiety   . Arthritis   . Bipolar disorder ([place])    type 2 hypomanic  . Complication of anesthesia    pt request no spinal anesthesia only general  . DDD (degenerative disc disease), lumbar   . High  cholesterol   . History of diabetes mellitus    resolved after weight loss  . History of mumps as a child   . History of sleep apnea    most recent study was negative for sleep apnea, resolved after weight loss   . Hypothyroidism   . Myelopathy ([place])   . Numbness and tingling in hands   . Osteopenia   . Osteoporosis   . Right arm fracture 1953   x2  . Scoliosis    per pt  . Spinal stenosis    Moderate to severe  . Vertigo   . Wears glasses     PAST SURGICAL HISTORY: Past Surgical History:  Procedure Laterality Date  . APPENDECTOMY    . CARPAL TUNNEL RELEASE Bilateral   . [place]   x2  . COLONOSCOPY WITH PROPOFOL N/A 09/14/2019   Procedure: COLONOSCOPY WITH PROPOFOL;  Surgeon: Wilford Corner, MD;  Location: [facility];  Service: Endoscopy;  Laterality: N/A;  . ESOPHAGOGASTRODUODENOSCOPY (EGD) WITH PROPOFOL N/A 09/14/2019   Procedure: ESOPHAGOGASTRODUODENOSCOPY (EGD) WITH PROPOFOL;  Surgeon: Wilford Corner, MD;  Location: [facility];  Service: Endoscopy;  Laterality: N/A;  . EYE SURGERY Bilateral 2006   cataracts  . FRACTURE SURGERY     Arm  . GASTRIC BYPASS  2006  . POSTERIOR CERVICAL FUSION/FORAMINOTOMY N/A 11/25/2015   Procedure: Posterior Cervical Fusion with lateral mass fixation Cervical fiv-Thoracic one, cervical laminectomy  - Cervical six-Cervical seven - Cervical seven-Thoracic one;  Surgeon: Eustace Moore, MD;  Location: [facility];  Service: Neurosurgery;  Laterality: N/A;  .  TOTAL KNEE ARTHROPLASTY Right 07/11/2017   Procedure: RIGHT TOTAL KNEE ARTHROPLASTY;  Surgeon: Susa Day, MD;  Location: [facility];  Service: Orthopedics;  Laterality: Right;  150 mins  . TOTAL KNEE ARTHROPLASTY Left 03/12/2018   Procedure: LEFT TOTAL KNEE ARTHROPLASTY;  Surgeon: Susa Day, MD;  Location: [facility];  Service: Orthopedics;  Laterality: Left;  Adductor Block  . TOTAL SHOULDER ARTHROPLASTY Bilateral 2010, 2011    MEDICATIONS: Current  Outpatient Medications on File Prior to Visit  Medication Sig Dispense Refill  . acetaminophen (TYLENOL) 500 MG tablet Take 1,000 mg by mouth See admin instructions. Take two tablets (1000 mg) by mouth every morning, may also take two tablets (1000 mg) every 6 hours as needed for pain    . alendronate (FOSAMAX) 70 MG tablet Take 70 mg by mouth every Monday. Take with a full glass of water on an empty stomach.    . ALPRAZolam (XANAX) 0.5 MG tablet Take 0.5 mg by mouth 3 (three) times daily as needed for anxiety.     Marland Kitchen amphetamine-dextroamphetamine (ADDERALL) 30 MG tablet Take 30 mg by mouth 2 (two) times daily with breakfast and lunch.     . b complex vitamins tablet Take 1 tablet by mouth daily after supper.     . Biotin 5000 MCG TABS Take 5,000 mcg by mouth daily.     Marland Kitchen buPROPion (WELLBUTRIN XL) 150 MG 24 hr tablet Take 150 mg by mouth daily with breakfast. Take with a 300 mg tablet for a total dose of 450 mg    . buPROPion (WELLBUTRIN XL) 300 MG 24 hr tablet Take 300 mg by mouth daily with breakfast. Take with a 150 mg tablet for a total dose of 450 mg  0  . calcium carbonate (OS-CAL) 600 MG tablet Take 600 mg by mouth 2 (two) times daily.    . Cholecalciferol (VITAMIN D) 50 MCG (2000 UT) CAPS Take 2,000 Units by mouth daily after lunch.     . Cinnamon 500 MG TABS Take 500 mg by mouth daily after supper.    . Coenzyme Q10 300 MG CAPS Take 300 mg by mouth daily with lunch.    . diclofenac (FLECTOR) 1.3 % PTCH Place 1 patch onto the skin 2 (two) times daily. 60 patch 0  . diphenhydrAMINE (BENADRYL ALLERGY) 25 MG tablet Take 25 mg by mouth 2 (two) times daily as needed for allergies.    Marland Kitchen divalproex (DEPAKOTE ER) 500 MG 24 hr tablet Take 1,000 mg by mouth daily with breakfast.   0  . docusate sodium (COLACE) 100 MG capsule Take 1 capsule (100 mg total) by mouth 2 (two) times daily. (Patient not taking: Reported on 09/12/2019) 40 capsule 1  . ferrous sulfate 325 (65 FE) MG tablet Take 325 mg by mouth  at bedtime.    . furosemide (LASIX) 20 MG tablet Take 20 mg by mouth 2 (two) times daily as needed for fluid or edema.     Marland Kitchen GLUCOSAMINE-CHONDROITIN PO Take 1 capsule by mouth daily.     Marland Kitchen HYDROcodone-acetaminophen (NORCO) 7.5-325 MG tablet Take 1-2 tablets by mouth every 6 (six) hours as needed for severe pain (pain score 7-10). (Patient not taking: Reported on 09/12/2019) 40 tablet 0  . levothyroxine (SYNTHROID, LEVOTHROID) 50 MCG tablet Take 50 mcg by mouth daily with breakfast.     . lovastatin (MEVACOR) 10 MG tablet Take 10 mg by mouth daily with supper.    . Magnesium 500 MG TABS Take 500 mg  by mouth daily after supper.    . Menthol, Topical Analgesic, (BENGAY EX) Apply 1 application topically 2 (two) times daily as needed (pain).    . methocarbamol (ROBAXIN) 500 MG tablet Take 1 tablet (500 mg total) by mouth every 6 (six) hours as needed for muscle spasms. (Patient taking differently: Take 500 mg by mouth 2 (two) times daily as needed for muscle spasms. ) 40 tablet 1  . Multiple Vitamins-Minerals (MULTIVITAMIN WITH MINERALS) tablet Take 1 tablet by mouth daily with lunch.     . nabumetone (RELAFEN) 500 MG tablet Take 500 mg by mouth 2 (two) times daily as needed (pain).     . niacin 500 MG tablet Take 500 mg by mouth daily after lunch.     . Omega-3 Fatty Acids (FISH OIL) 1000 MG CAPS Take 1,000 mg by mouth every morning.    . pantoprazole (PROTONIX) 40 MG tablet Take 1 tablet (40 mg total) by mouth 2 (two) times daily. 30 tablet 3  . phenylephrine (SUDAFED PE) 10 MG TABS tablet Take 10 mg by mouth 3 times/day as needed-between meals & bedtime (sinus congestion).    Vladimir Faster Glycol-Propyl Glycol (SYSTANE) 0.4-0.3 % GEL ophthalmic gel Place 1 application into both eyes daily as needed (dry eyes/irritation/allergies).    . polyethylene glycol (MIRALAX / GLYCOLAX) packet Take 17 g by mouth daily as needed for mild constipation. (Patient not taking: Reported on 09/12/2019) 14 each 0  .  Probiotic Product (PROBIOTIC DAILY PO) Take 1 capsule by mouth daily after supper.     . traZODone (DESYREL) 100 MG tablet Take 100 mg by mouth at bedtime as needed for sleep.     . Turmeric 500 MG CAPS Take 500 mg by mouth 2 (two) times daily.    . vitamin B-12 (CYANOCOBALAMIN) 1000 MCG tablet Take 1,000 mcg by mouth daily after supper.      No current facility-administered medications on file prior to visit.    ALLERGIES: No Known Allergies  FAMILY HISTORY: Family History  Problem Relation Age of Onset  . Breast cancer Maternal Aunt        onset in 63s    SOCIAL HISTORY: Social History   Socioeconomic History  . Marital status: Married    Spouse name: Not on file  . Number of children: Not on file  . Years of education: Not on file  . Highest education level: Not on file  Occupational History  . Not on file  Tobacco Use  . Smoking status: Former Smoker    Quit date: 01/20/1993    Years since quitting: 27.5  . Smokeless tobacco: Never Used  Vaping Use  . Vaping Use: Never used  Substance and Sexual Activity  . Alcohol use: Yes    Comment: rarely  . Drug use: No  . Sexual activity: Yes    Birth control/protection: Post-menopausal  Other Topics Concern  . Not on file  Social History Narrative  . Not on file   Social Determinants of Health   Financial Resource Strain:   . Difficulty of Paying Living Expenses: Not on file  Food Insecurity:   . Worried About Charity fundraiser in the Last Year: Not on file  . Ran Out of Food in the Last Year: Not on file  Transportation Needs:   . Lack of Transportation (Medical): Not on file  . Lack of Transportation (Non-Medical): Not on file  Physical Activity:   . Days of Exercise per Week: Not on  file  . Minutes of Exercise per Session: Not on file  Stress:   . Feeling of Stress : Not on file  Social Connections:   . Frequency of Communication with Friends and Family: Not on file  . Frequency of Social Gatherings with  Friends and Family: Not on file  . Attends Religious Services: Not on file  . Active Member of Clubs or Organizations: Not on file  . Attends Archivist Meetings: Not on file  . Marital Status: Not on file  Intimate Partner Violence:   . Fear of Current or Ex-Partner: Not on file  . Emotionally Abused: Not on file  . Physically Abused: Not on file  . Sexually Abused: Not on file    PHYSICAL EXAM: Blood pressure (!) 148/69, pulse (!) 7, height 4\' 10"  (1.473 m), weight 123 lb 3.2 oz (55.9 kg), SpO2 96 %. General: No acute distress.  Patient appears well-groomed.  Head:  Normocephalic/atraumatic Eyes:  fundi examined but not visualized Neck: supple, no paraspinal tenderness, full range of motion Back: No paraspinal tenderness Heart: regular rate and rhythm Lungs: Clear to auscultation bilaterally. Vascular: No carotid bruits. Neurological Exam: Mental status: alert and oriented to person, place, and time, recent and remote memory intact, fund of knowledge intact, attention and concentration intact, speech fluent and not dysarthric, language intact. Cranial nerves: CN I: not tested CN II: pupils equal, round and reactive to light, visual fields intact CN III, IV, VI:  full range of motion, no nystagmus, no ptosis CN V: facial sensation intact CN VII: upper and lower face symmetric CN VIII: hearing intact CN IX, X: gag intact, uvula midline CN XI: sternocleidomastoid and trapezius muscles intact CN XII: tongue midline Bulk & Tone: normal, no fasciculations. Motor:  5/5 throughout  Sensation:  Pinprick sensation intact and vibration sensation reduced in feet. Deep Tendon Reflexes:  2+ throughout, toes downgoing.  Finger to nose testing:  Without dysmetria.  Postural and intention tremor of hands.   Heel to shin:  Without dysmetria.   Gait:  Mildly wide-based gait.  Able to turn and tandem walk but with some difficulty. Romberg negative.  IMPRESSION: 1.  Dizziness.  It  seemed consistent with a peripheral vertigo which has resolved. 2.  Balance problems.  This is a chronic problem, she says since childhood.  She has had workup over the past 5 years.  She was found to have cervical spinal stenosis and underwent surgery.  She endorsed no improvement in balance and went on to have a brain MRI, which was unremarkable.  She does have some evidence of neuropathy in the feet, which is likely playing a role.  She does not exhibit any signs to suggest recurrent stenosis causing myelopathy.  3.  Essential tremor.  Treatment options would include propranolol, but she is reluctant because of low blood pressure.  I would not start primidone as it may potentially lower levels of her Depakote.  PLAN: She will follow up as needed.  At this time, her balance problems are not new.  We can repeat MRI of cervical spine but I do not appreciate any [facility] signs to suggest recurrence of a stenosis causing myelopathy and she declines such imaging anyway.    Thank you for allowing me to take part in the care of this patient.  Metta Clines, DO  CC:  Carolee Rota, NP  Sammuel Hines, PA-C

## 2020-07-19 ENCOUNTER — Ambulatory Visit (INDEPENDENT_AMBULATORY_CARE_PROVIDER_SITE_OTHER): Payer: Medicare Other | Admitting: Neurology

## 2020-07-19 ENCOUNTER — Encounter: Payer: Self-pay | Admitting: Neurology

## 2020-07-19 ENCOUNTER — Other Ambulatory Visit: Payer: Self-pay

## 2020-07-19 VITALS — BP 148/69 | HR 7 | Ht <= 58 in | Wt 123.2 lb

## 2020-07-19 DIAGNOSIS — R2681 Unsteadiness on feet: Secondary | ICD-10-CM | POA: Diagnosis not present

## 2020-07-19 DIAGNOSIS — G250 Essential tremor: Secondary | ICD-10-CM

## 2020-07-19 DIAGNOSIS — Z23 Encounter for immunization: Secondary | ICD-10-CM | POA: Diagnosis not present

## 2020-07-19 DIAGNOSIS — Z981 Arthrodesis status: Secondary | ICD-10-CM | POA: Diagnosis not present

## 2020-07-19 NOTE — Patient Instructions (Signed)
I think the tremor is a benign essential tremor.  The balance problems are likely related to nerve damage in the feet.

## 2020-09-14 ENCOUNTER — Ambulatory Visit: Payer: Medicare Other | Admitting: Neurology

## 2020-09-27 DIAGNOSIS — F319 Bipolar disorder, unspecified: Secondary | ICD-10-CM | POA: Diagnosis not present

## 2020-09-27 DIAGNOSIS — M159 Polyosteoarthritis, unspecified: Secondary | ICD-10-CM | POA: Diagnosis not present

## 2020-09-27 DIAGNOSIS — E785 Hyperlipidemia, unspecified: Secondary | ICD-10-CM | POA: Diagnosis not present

## 2020-09-27 DIAGNOSIS — N183 Chronic kidney disease, stage 3 unspecified: Secondary | ICD-10-CM | POA: Diagnosis not present

## 2020-09-27 DIAGNOSIS — K279 Peptic ulcer, site unspecified, unspecified as acute or chronic, without hemorrhage or perforation: Secondary | ICD-10-CM | POA: Diagnosis not present

## 2020-09-27 DIAGNOSIS — Z1211 Encounter for screening for malignant neoplasm of colon: Secondary | ICD-10-CM | POA: Diagnosis not present

## 2020-09-27 DIAGNOSIS — E1169 Type 2 diabetes mellitus with other specified complication: Secondary | ICD-10-CM | POA: Diagnosis not present

## 2020-09-27 DIAGNOSIS — M810 Age-related osteoporosis without current pathological fracture: Secondary | ICD-10-CM | POA: Diagnosis not present

## 2020-09-27 DIAGNOSIS — R600 Localized edema: Secondary | ICD-10-CM | POA: Diagnosis not present

## 2020-09-27 DIAGNOSIS — D7589 Other specified diseases of blood and blood-forming organs: Secondary | ICD-10-CM | POA: Diagnosis not present

## 2020-09-27 DIAGNOSIS — M62838 Other muscle spasm: Secondary | ICD-10-CM | POA: Diagnosis not present

## 2020-09-27 DIAGNOSIS — E039 Hypothyroidism, unspecified: Secondary | ICD-10-CM | POA: Diagnosis not present

## 2020-09-28 DIAGNOSIS — Z1211 Encounter for screening for malignant neoplasm of colon: Secondary | ICD-10-CM | POA: Diagnosis not present

## 2020-10-05 DIAGNOSIS — F3110 Bipolar disorder, current episode manic without psychotic features, unspecified: Secondary | ICD-10-CM | POA: Diagnosis not present

## 2020-10-11 DIAGNOSIS — Z03818 Encounter for observation for suspected exposure to other biological agents ruled out: Secondary | ICD-10-CM | POA: Diagnosis not present

## 2020-10-20 DIAGNOSIS — E1169 Type 2 diabetes mellitus with other specified complication: Secondary | ICD-10-CM | POA: Diagnosis not present

## 2020-10-20 DIAGNOSIS — E119 Type 2 diabetes mellitus without complications: Secondary | ICD-10-CM | POA: Diagnosis not present

## 2020-10-20 DIAGNOSIS — D500 Iron deficiency anemia secondary to blood loss (chronic): Secondary | ICD-10-CM | POA: Diagnosis not present

## 2020-10-20 DIAGNOSIS — N183 Chronic kidney disease, stage 3 unspecified: Secondary | ICD-10-CM | POA: Diagnosis not present

## 2020-10-20 DIAGNOSIS — E039 Hypothyroidism, unspecified: Secondary | ICD-10-CM | POA: Diagnosis not present

## 2020-10-20 DIAGNOSIS — M159 Polyosteoarthritis, unspecified: Secondary | ICD-10-CM | POA: Diagnosis not present

## 2020-10-20 DIAGNOSIS — F3181 Bipolar II disorder: Secondary | ICD-10-CM | POA: Diagnosis not present

## 2020-10-20 DIAGNOSIS — E785 Hyperlipidemia, unspecified: Secondary | ICD-10-CM | POA: Diagnosis not present

## 2020-10-20 DIAGNOSIS — M810 Age-related osteoporosis without current pathological fracture: Secondary | ICD-10-CM | POA: Diagnosis not present

## 2020-11-03 DIAGNOSIS — E039 Hypothyroidism, unspecified: Secondary | ICD-10-CM | POA: Diagnosis not present

## 2020-11-03 DIAGNOSIS — Z23 Encounter for immunization: Secondary | ICD-10-CM | POA: Diagnosis not present

## 2020-11-03 DIAGNOSIS — R944 Abnormal results of kidney function studies: Secondary | ICD-10-CM | POA: Diagnosis not present

## 2020-12-18 DIAGNOSIS — D500 Iron deficiency anemia secondary to blood loss (chronic): Secondary | ICD-10-CM | POA: Diagnosis not present

## 2020-12-18 DIAGNOSIS — M810 Age-related osteoporosis without current pathological fracture: Secondary | ICD-10-CM | POA: Diagnosis not present

## 2020-12-18 DIAGNOSIS — N183 Chronic kidney disease, stage 3 unspecified: Secondary | ICD-10-CM | POA: Diagnosis not present

## 2020-12-18 DIAGNOSIS — E119 Type 2 diabetes mellitus without complications: Secondary | ICD-10-CM | POA: Diagnosis not present

## 2020-12-18 DIAGNOSIS — F3181 Bipolar II disorder: Secondary | ICD-10-CM | POA: Diagnosis not present

## 2020-12-18 DIAGNOSIS — E039 Hypothyroidism, unspecified: Secondary | ICD-10-CM | POA: Diagnosis not present

## 2020-12-18 DIAGNOSIS — E1169 Type 2 diabetes mellitus with other specified complication: Secondary | ICD-10-CM | POA: Diagnosis not present

## 2020-12-18 DIAGNOSIS — E785 Hyperlipidemia, unspecified: Secondary | ICD-10-CM | POA: Diagnosis not present

## 2020-12-18 DIAGNOSIS — M159 Polyosteoarthritis, unspecified: Secondary | ICD-10-CM | POA: Diagnosis not present

## 2020-12-26 DIAGNOSIS — F3110 Bipolar disorder, current episode manic without psychotic features, unspecified: Secondary | ICD-10-CM | POA: Diagnosis not present

## 2021-01-18 DIAGNOSIS — F3181 Bipolar II disorder: Secondary | ICD-10-CM | POA: Diagnosis not present

## 2021-01-18 DIAGNOSIS — E039 Hypothyroidism, unspecified: Secondary | ICD-10-CM | POA: Diagnosis not present

## 2021-01-18 DIAGNOSIS — E785 Hyperlipidemia, unspecified: Secondary | ICD-10-CM | POA: Diagnosis not present

## 2021-01-18 DIAGNOSIS — D500 Iron deficiency anemia secondary to blood loss (chronic): Secondary | ICD-10-CM | POA: Diagnosis not present

## 2021-01-18 DIAGNOSIS — E1169 Type 2 diabetes mellitus with other specified complication: Secondary | ICD-10-CM | POA: Diagnosis not present

## 2021-01-18 DIAGNOSIS — N183 Chronic kidney disease, stage 3 unspecified: Secondary | ICD-10-CM | POA: Diagnosis not present

## 2021-01-18 DIAGNOSIS — M810 Age-related osteoporosis without current pathological fracture: Secondary | ICD-10-CM | POA: Diagnosis not present

## 2021-01-18 DIAGNOSIS — M159 Polyosteoarthritis, unspecified: Secondary | ICD-10-CM | POA: Diagnosis not present

## 2021-01-18 DIAGNOSIS — E119 Type 2 diabetes mellitus without complications: Secondary | ICD-10-CM | POA: Diagnosis not present

## 2021-01-24 DIAGNOSIS — Z23 Encounter for immunization: Secondary | ICD-10-CM | POA: Diagnosis not present

## 2021-03-19 DIAGNOSIS — N183 Chronic kidney disease, stage 3 unspecified: Secondary | ICD-10-CM | POA: Diagnosis not present

## 2021-03-19 DIAGNOSIS — F3181 Bipolar II disorder: Secondary | ICD-10-CM | POA: Diagnosis not present

## 2021-03-19 DIAGNOSIS — M159 Polyosteoarthritis, unspecified: Secondary | ICD-10-CM | POA: Diagnosis not present

## 2021-03-19 DIAGNOSIS — E785 Hyperlipidemia, unspecified: Secondary | ICD-10-CM | POA: Diagnosis not present

## 2021-03-19 DIAGNOSIS — M810 Age-related osteoporosis without current pathological fracture: Secondary | ICD-10-CM | POA: Diagnosis not present

## 2021-03-19 DIAGNOSIS — E1169 Type 2 diabetes mellitus with other specified complication: Secondary | ICD-10-CM | POA: Diagnosis not present

## 2021-03-19 DIAGNOSIS — E039 Hypothyroidism, unspecified: Secondary | ICD-10-CM | POA: Diagnosis not present

## 2021-03-19 DIAGNOSIS — D500 Iron deficiency anemia secondary to blood loss (chronic): Secondary | ICD-10-CM | POA: Diagnosis not present

## 2021-03-19 DIAGNOSIS — E119 Type 2 diabetes mellitus without complications: Secondary | ICD-10-CM | POA: Diagnosis not present

## 2021-03-29 DIAGNOSIS — F3110 Bipolar disorder, current episode manic without psychotic features, unspecified: Secondary | ICD-10-CM | POA: Diagnosis not present

## 2021-04-10 DIAGNOSIS — M810 Age-related osteoporosis without current pathological fracture: Secondary | ICD-10-CM | POA: Diagnosis not present

## 2021-04-10 DIAGNOSIS — D500 Iron deficiency anemia secondary to blood loss (chronic): Secondary | ICD-10-CM | POA: Diagnosis not present

## 2021-04-10 DIAGNOSIS — N183 Chronic kidney disease, stage 3 unspecified: Secondary | ICD-10-CM | POA: Diagnosis not present

## 2021-04-10 DIAGNOSIS — E1122 Type 2 diabetes mellitus with diabetic chronic kidney disease: Secondary | ICD-10-CM | POA: Diagnosis not present

## 2021-04-10 DIAGNOSIS — M159 Polyosteoarthritis, unspecified: Secondary | ICD-10-CM | POA: Diagnosis not present

## 2021-04-10 DIAGNOSIS — E785 Hyperlipidemia, unspecified: Secondary | ICD-10-CM | POA: Diagnosis not present

## 2021-04-10 DIAGNOSIS — E039 Hypothyroidism, unspecified: Secondary | ICD-10-CM | POA: Diagnosis not present

## 2021-04-10 DIAGNOSIS — F3181 Bipolar II disorder: Secondary | ICD-10-CM | POA: Diagnosis not present

## 2021-04-10 DIAGNOSIS — E119 Type 2 diabetes mellitus without complications: Secondary | ICD-10-CM | POA: Diagnosis not present

## 2021-04-10 DIAGNOSIS — E1169 Type 2 diabetes mellitus with other specified complication: Secondary | ICD-10-CM | POA: Diagnosis not present

## 2021-04-14 DIAGNOSIS — K219 Gastro-esophageal reflux disease without esophagitis: Secondary | ICD-10-CM | POA: Diagnosis not present

## 2021-04-14 DIAGNOSIS — I700 Atherosclerosis of aorta: Secondary | ICD-10-CM | POA: Diagnosis not present

## 2021-04-14 DIAGNOSIS — E785 Hyperlipidemia, unspecified: Secondary | ICD-10-CM | POA: Diagnosis not present

## 2021-04-14 DIAGNOSIS — F3181 Bipolar II disorder: Secondary | ICD-10-CM | POA: Diagnosis not present

## 2021-04-14 DIAGNOSIS — M542 Cervicalgia: Secondary | ICD-10-CM | POA: Diagnosis not present

## 2021-04-14 DIAGNOSIS — N183 Chronic kidney disease, stage 3 unspecified: Secondary | ICD-10-CM | POA: Diagnosis not present

## 2021-04-14 DIAGNOSIS — E1122 Type 2 diabetes mellitus with diabetic chronic kidney disease: Secondary | ICD-10-CM | POA: Diagnosis not present

## 2021-04-14 DIAGNOSIS — Z0000 Encounter for general adult medical examination without abnormal findings: Secondary | ICD-10-CM | POA: Diagnosis not present

## 2021-04-14 DIAGNOSIS — M810 Age-related osteoporosis without current pathological fracture: Secondary | ICD-10-CM | POA: Diagnosis not present

## 2021-04-14 DIAGNOSIS — M159 Polyosteoarthritis, unspecified: Secondary | ICD-10-CM | POA: Diagnosis not present

## 2021-04-14 DIAGNOSIS — E039 Hypothyroidism, unspecified: Secondary | ICD-10-CM | POA: Diagnosis not present

## 2021-05-04 DIAGNOSIS — Z6825 Body mass index (BMI) 25.0-25.9, adult: Secondary | ICD-10-CM | POA: Diagnosis not present

## 2021-05-04 DIAGNOSIS — M542 Cervicalgia: Secondary | ICD-10-CM | POA: Diagnosis not present

## 2021-05-16 DIAGNOSIS — M542 Cervicalgia: Secondary | ICD-10-CM | POA: Diagnosis not present

## 2021-05-19 DIAGNOSIS — M542 Cervicalgia: Secondary | ICD-10-CM | POA: Diagnosis not present

## 2021-06-01 DIAGNOSIS — M542 Cervicalgia: Secondary | ICD-10-CM | POA: Diagnosis not present

## 2021-06-05 DIAGNOSIS — M47816 Spondylosis without myelopathy or radiculopathy, lumbar region: Secondary | ICD-10-CM | POA: Diagnosis not present

## 2021-06-09 DIAGNOSIS — M542 Cervicalgia: Secondary | ICD-10-CM | POA: Diagnosis not present

## 2021-06-12 DIAGNOSIS — M542 Cervicalgia: Secondary | ICD-10-CM | POA: Diagnosis not present

## 2021-06-28 DIAGNOSIS — M542 Cervicalgia: Secondary | ICD-10-CM | POA: Diagnosis not present

## 2021-06-29 DIAGNOSIS — F3110 Bipolar disorder, current episode manic without psychotic features, unspecified: Secondary | ICD-10-CM | POA: Diagnosis not present

## 2021-06-30 DIAGNOSIS — M542 Cervicalgia: Secondary | ICD-10-CM | POA: Diagnosis not present

## 2021-07-03 DIAGNOSIS — M542 Cervicalgia: Secondary | ICD-10-CM | POA: Diagnosis not present

## 2021-07-05 DIAGNOSIS — M542 Cervicalgia: Secondary | ICD-10-CM | POA: Diagnosis not present

## 2021-07-06 DIAGNOSIS — Z6826 Body mass index (BMI) 26.0-26.9, adult: Secondary | ICD-10-CM | POA: Diagnosis not present

## 2021-07-06 DIAGNOSIS — M542 Cervicalgia: Secondary | ICD-10-CM | POA: Diagnosis not present

## 2021-07-17 DIAGNOSIS — M542 Cervicalgia: Secondary | ICD-10-CM | POA: Diagnosis not present

## 2021-07-17 DIAGNOSIS — Z23 Encounter for immunization: Secondary | ICD-10-CM | POA: Diagnosis not present

## 2021-07-26 DIAGNOSIS — M542 Cervicalgia: Secondary | ICD-10-CM | POA: Diagnosis not present

## 2021-07-27 DIAGNOSIS — F3110 Bipolar disorder, current episode manic without psychotic features, unspecified: Secondary | ICD-10-CM | POA: Diagnosis not present

## 2021-07-28 DIAGNOSIS — M542 Cervicalgia: Secondary | ICD-10-CM | POA: Diagnosis not present

## 2021-08-02 DIAGNOSIS — M542 Cervicalgia: Secondary | ICD-10-CM | POA: Diagnosis not present

## 2021-08-04 DIAGNOSIS — M542 Cervicalgia: Secondary | ICD-10-CM | POA: Diagnosis not present

## 2021-08-10 DIAGNOSIS — M542 Cervicalgia: Secondary | ICD-10-CM | POA: Diagnosis not present

## 2021-08-16 DIAGNOSIS — M542 Cervicalgia: Secondary | ICD-10-CM | POA: Diagnosis not present

## 2021-08-21 DIAGNOSIS — M542 Cervicalgia: Secondary | ICD-10-CM | POA: Diagnosis not present

## 2021-10-18 DIAGNOSIS — K219 Gastro-esophageal reflux disease without esophagitis: Secondary | ICD-10-CM | POA: Diagnosis not present

## 2021-10-18 DIAGNOSIS — M810 Age-related osteoporosis without current pathological fracture: Secondary | ICD-10-CM | POA: Diagnosis not present

## 2021-10-18 DIAGNOSIS — R42 Dizziness and giddiness: Secondary | ICD-10-CM | POA: Diagnosis not present

## 2021-10-18 DIAGNOSIS — E039 Hypothyroidism, unspecified: Secondary | ICD-10-CM | POA: Diagnosis not present

## 2021-10-18 DIAGNOSIS — E1169 Type 2 diabetes mellitus with other specified complication: Secondary | ICD-10-CM | POA: Diagnosis not present

## 2021-10-18 DIAGNOSIS — Z79899 Other long term (current) drug therapy: Secondary | ICD-10-CM | POA: Diagnosis not present

## 2021-10-18 DIAGNOSIS — M62838 Other muscle spasm: Secondary | ICD-10-CM | POA: Diagnosis not present

## 2021-10-18 DIAGNOSIS — E785 Hyperlipidemia, unspecified: Secondary | ICD-10-CM | POA: Diagnosis not present

## 2021-10-18 DIAGNOSIS — I700 Atherosclerosis of aorta: Secondary | ICD-10-CM | POA: Diagnosis not present

## 2021-10-18 DIAGNOSIS — F3181 Bipolar II disorder: Secondary | ICD-10-CM | POA: Diagnosis not present

## 2021-10-18 DIAGNOSIS — M159 Polyosteoarthritis, unspecified: Secondary | ICD-10-CM | POA: Diagnosis not present

## 2021-10-19 DIAGNOSIS — E039 Hypothyroidism, unspecified: Secondary | ICD-10-CM | POA: Diagnosis not present

## 2021-10-19 DIAGNOSIS — F3110 Bipolar disorder, current episode manic without psychotic features, unspecified: Secondary | ICD-10-CM | POA: Diagnosis not present

## 2021-10-19 DIAGNOSIS — Z79899 Other long term (current) drug therapy: Secondary | ICD-10-CM | POA: Diagnosis not present

## 2021-10-20 DIAGNOSIS — F329 Major depressive disorder, single episode, unspecified: Secondary | ICD-10-CM | POA: Diagnosis not present

## 2022-01-04 DIAGNOSIS — E119 Type 2 diabetes mellitus without complications: Secondary | ICD-10-CM | POA: Diagnosis not present

## 2022-01-04 DIAGNOSIS — H5051 Esophoria: Secondary | ICD-10-CM | POA: Diagnosis not present

## 2022-01-04 DIAGNOSIS — H532 Diplopia: Secondary | ICD-10-CM | POA: Diagnosis not present

## 2022-01-11 DIAGNOSIS — F3110 Bipolar disorder, current episode manic without psychotic features, unspecified: Secondary | ICD-10-CM | POA: Diagnosis not present

## 2022-02-21 DIAGNOSIS — H8112 Benign paroxysmal vertigo, left ear: Secondary | ICD-10-CM | POA: Diagnosis not present

## 2022-02-21 DIAGNOSIS — Z23 Encounter for immunization: Secondary | ICD-10-CM | POA: Diagnosis not present

## 2022-02-21 DIAGNOSIS — R011 Cardiac murmur, unspecified: Secondary | ICD-10-CM | POA: Diagnosis not present

## 2022-02-21 DIAGNOSIS — Z9189 Other specified personal risk factors, not elsewhere classified: Secondary | ICD-10-CM | POA: Diagnosis not present

## 2022-02-21 DIAGNOSIS — J302 Other seasonal allergic rhinitis: Secondary | ICD-10-CM | POA: Diagnosis not present

## 2022-02-22 ENCOUNTER — Other Ambulatory Visit (HOSPITAL_BASED_OUTPATIENT_CLINIC_OR_DEPARTMENT_OTHER): Payer: Self-pay | Admitting: Family Medicine

## 2022-02-22 DIAGNOSIS — R42 Dizziness and giddiness: Secondary | ICD-10-CM

## 2022-03-02 ENCOUNTER — Ambulatory Visit (HOSPITAL_BASED_OUTPATIENT_CLINIC_OR_DEPARTMENT_OTHER)
Admission: RE | Admit: 2022-03-02 | Discharge: 2022-03-02 | Disposition: A | Discharge status: Home / Self Care | Payer: Medicare Other | Source: Ambulatory Visit | Attending: Family Medicine | Admitting: Family Medicine

## 2022-03-02 DIAGNOSIS — E785 Hyperlipidemia, unspecified: Secondary | ICD-10-CM | POA: Diagnosis not present

## 2022-03-02 DIAGNOSIS — R42 Dizziness and giddiness: Secondary | ICD-10-CM | POA: Diagnosis not present

## 2022-03-02 DIAGNOSIS — I6523 Occlusion and stenosis of bilateral carotid arteries: Secondary | ICD-10-CM | POA: Diagnosis not present

## 2022-03-08 ENCOUNTER — Encounter: Payer: Self-pay | Admitting: Cardiology

## 2022-03-08 ENCOUNTER — Ambulatory Visit (INDEPENDENT_AMBULATORY_CARE_PROVIDER_SITE_OTHER): Payer: Medicare Other | Admitting: Cardiology

## 2022-03-08 VITALS — BP 118/64 | HR 92 | Ht 59.0 in | Wt 141.8 lb

## 2022-03-08 DIAGNOSIS — R011 Cardiac murmur, unspecified: Secondary | ICD-10-CM | POA: Diagnosis not present

## 2022-03-08 DIAGNOSIS — Z7689 Persons encountering health services in other specified circumstances: Secondary | ICD-10-CM

## 2022-03-08 DIAGNOSIS — E782 Mixed hyperlipidemia: Secondary | ICD-10-CM

## 2022-03-08 DIAGNOSIS — R5383 Other fatigue: Secondary | ICD-10-CM | POA: Diagnosis not present

## 2022-03-08 NOTE — Patient Instructions (Addendum)
Medication Instructions:  Your physician recommends that you continue on your current medications as directed. Please refer to the Current Medication list given to you today.  *If you need a refill on your cardiac medications before your next appointment, please call your pharmacy*   Lab Work: None If you have labs (blood work) drawn today and your tests are completely normal, you will receive your results only by: [place] (if you have MyChart) OR A paper copy in the mail If you have any lab test that is abnormal or we need to change your treatment, we will call you to review the results.   Testing/Procedures: Your physician has requested that you have an echocardiogram. Echocardiography is a painless test that uses sound waves to create images of your heart. It provides your doctor with information about the size and shape of your heart and how well your heart's chambers and valves are working. This procedure takes approximately one hour. There are no restrictions for this procedure.   Follow-Up: At [facility], you and your health needs are our priority.  As part of our continuing mission to provide you with exceptional heart care, we have created designated Provider Care Teams.  These Care Teams include your primary Cardiologist (physician) and Advanced Practice Providers (APPs -  Physician Assistants and Nurse Practitioners) who all work together to provide you with the care you need, when you need it.  We recommend signing up for the patient portal called "MyChart".  Sign up information is provided on this After Visit Summary.  MyChart is used to connect with patients for Virtual Visits (Telemedicine).  Patients are able to view lab/test results, encounter notes, upcoming appointments, etc.  Non-urgent messages can be sent to your provider as well.   To learn more about what you can do with MyChart, go to [email or web address].    Your next appointment:   6 month(s)  The  format for your next appointment:   In Person  Provider:   Berniece Salines, DO    Other Instructions   Important Information About Sugar

## 2022-03-08 NOTE — Progress Notes (Addendum)
Cardiology Office Note:    Date:  03/09/2022   ID:  Alexandra Morrison, DOB Jun 09, 1946, MRN [id number]  PCP:  Aurea Graff, PA-C (Inactive)  Cardiologist:  None  Electrophysiologist:  None   Referring MD: Lyman Bishop, DO   Referral reason: New Murmur  History of Present Illness:    Alexandra Morrison is a 76 y.o. female with a hx of diabetes, abdominal aortic atherosclerosis, Bipolar disorder.   She is presenting today due to a new murmur that was noted on a visit with her PCP's office for vertigo. At that office visit, patient had been having vertigo for a few weeks and was prescribed some exercises to do, which have started to improve the symptoms.   She does report some increased fatigue in the last year or so. She previously was able to work outside during the summer months all day and is now limited to about 4-5 hours per day due to physical fatigue.   Patient has no known cardiac disease and is not on ASA. She does have a history of diabetes but has been diet controlled since losing 150lbs many years ago. Did note a history of OSA as well but since losing the weight, has not required a CPAP. Last sleep study was done about 10 years prior.     Past Medical History:  Diagnosis Date   Abdominal aortic atherosclerosis (HCC)    Moderate   Anxiety    Arthritis    Bipolar disorder (HCC)    type 2 hypomanic   Complication of anesthesia    pt request no spinal anesthesia only general   DDD (degenerative disc disease), lumbar    High cholesterol    History of diabetes mellitus    resolved after weight loss   History of mumps as a child    History of sleep apnea    most recent study was negative for sleep apnea, resolved after weight loss    Hypothyroidism    Myelopathy (HCC)    Numbness and tingling in hands    Osteopenia    Osteoporosis    Right arm fracture 1953   x2   Scoliosis    per pt   Spinal stenosis    Moderate to severe   Vertigo    Wears glasses      Past Surgical History:  Procedure Laterality Date   APPENDECTOMY     CARPAL TUNNEL RELEASE Bilateral    CESAREAN SECTION  1971, 1973   x2   COLONOSCOPY WITH PROPOFOL N/A 09/14/2019   Procedure: COLONOSCOPY WITH PROPOFOL;  Surgeon: Wilford Corner, MD;  Location: [facility] ENDOSCOPY;  Service: Endoscopy;  Laterality: N/A;   ESOPHAGOGASTRODUODENOSCOPY (EGD) WITH PROPOFOL N/A 09/14/2019   Procedure: ESOPHAGOGASTRODUODENOSCOPY (EGD) WITH PROPOFOL;  Surgeon: Wilford Corner, MD;  Location: [place];  Service: Endoscopy;  Laterality: N/A;   EYE SURGERY Bilateral 2006   cataracts   FRACTURE SURGERY     Arm   GASTRIC BYPASS  2006   POSTERIOR CERVICAL FUSION/FORAMINOTOMY N/A 11/25/2015   Procedure: Posterior Cervical Fusion with lateral mass fixation Cervical fiv-Thoracic one, cervical laminectomy  - Cervical six-Cervical seven - Cervical seven-Thoracic one;  Surgeon: Eustace Moore, MD;  Location: [place] [facility];  Service: Neurosurgery;  Laterality: N/A;   TOTAL KNEE ARTHROPLASTY Right 07/11/2017   Procedure: RIGHT TOTAL KNEE ARTHROPLASTY;  Surgeon: Susa Day, MD;  Location: [facility];  Service: Orthopedics;  Laterality: Right;  150 mins   TOTAL KNEE ARTHROPLASTY Left 03/12/2018  Procedure: LEFT TOTAL KNEE ARTHROPLASTY;  Surgeon: Susa Day, MD;  Location: [facility] ORS;  Service: Orthopedics;  Laterality: Left;  Adductor Block   TOTAL SHOULDER ARTHROPLASTY Bilateral 2010, 2011    Current Medications: Current Meds  Medication Sig   acetaminophen (TYLENOL) 500 MG tablet Take 1,000 mg by mouth See admin instructions. Take two tablets (1000 mg) by mouth every morning, may also take two tablets (1000 mg) every 6 hours as needed for pain   alendronate (FOSAMAX) 70 MG tablet Take 70 mg by mouth every Monday. Take with a full glass of water on an empty stomach.   ALPRAZolam (XANAX) 0.5 MG tablet Take 0.5 mg by mouth 3 (three) times daily as needed for anxiety.    amphetamine-dextroamphetamine  (ADDERALL) 30 MG tablet Take 30 mg by mouth 2 (two) times daily with breakfast and lunch.    b complex vitamins tablet Take 1 tablet by mouth daily after supper.    Biotin 5000 MCG TABS Take 5,000 mcg by mouth daily.    buPROPion (WELLBUTRIN XL) 300 MG 24 hr tablet Take 300 mg by mouth daily with breakfast. Take with a 150 mg tablet for a total dose of 450 mg   calcium carbonate (OS-CAL) 600 MG tablet Take 600 mg by mouth 2 (two) times daily.   Cholecalciferol (VITAMIN D) 50 MCG (2000 UT) CAPS Take 2,000 Units by mouth daily after lunch.    Cinnamon 500 MG TABS Take 500 mg by mouth daily after supper.   Coenzyme Q10 300 MG CAPS Take 300 mg by mouth daily with lunch.   diphenhydrAMINE (BENADRYL) 25 MG tablet Take 25 mg by mouth 2 (two) times daily as needed for allergies.   divalproex (DEPAKOTE ER) 500 MG 24 hr tablet Take 1,000 mg by mouth daily with breakfast.    ferrous sulfate 325 (65 FE) MG tablet Take 325 mg by mouth at bedtime.   furosemide (LASIX) 20 MG tablet Take 20 mg by mouth 2 (two) times daily as needed for fluid or edema.    GLUCOSAMINE-CHONDROITIN PO Take 1 capsule by mouth daily.    levothyroxine (SYNTHROID, LEVOTHROID) 50 MCG tablet Take 50 mcg by mouth daily with breakfast.    lovastatin (MEVACOR) 10 MG tablet Take 10 mg by mouth daily with supper.   Magnesium 500 MG TABS Take 500 mg by mouth daily after supper.   Menthol, Topical Analgesic, (BENGAY EX) Apply 1 application topically 2 (two) times daily as needed (pain).   methocarbamol (ROBAXIN) 500 MG tablet Take 1 tablet (500 mg total) by mouth every 6 (six) hours as needed for muscle spasms. (Patient taking differently: Take 500 mg by mouth 2 (two) times daily as needed for muscle spasms.)   Multiple Vitamins-Minerals (MULTIVITAMIN WITH MINERALS) tablet Take 1 tablet by mouth daily with lunch.    nabumetone (RELAFEN) 500 MG tablet Take 500 mg by mouth 2 (two) times daily as needed (pain).   Omega-3 Fatty Acids (FISH OIL) 1000  MG CAPS Take 1,000 mg by mouth every morning.   pantoprazole (PROTONIX) 40 MG tablet Take 1 tablet (40 mg total) by mouth 2 (two) times daily.   phenylephrine (SUDAFED PE) 10 MG TABS tablet Take 10 mg by mouth 3 times/day as needed-between meals & bedtime (sinus congestion).   Probiotic Product (PROBIOTIC DAILY PO) Take 1 capsule by mouth daily after supper.    traZODone (DESYREL) 100 MG tablet Take 100 mg by mouth at bedtime as needed for sleep.    Turmeric 500  MG CAPS Take 500 mg by mouth 2 (two) times daily.   vitamin B-12 (CYANOCOBALAMIN) 1000 MCG tablet Take 1,000 mcg by mouth daily after supper.      Allergies:   Propofol   Social History   Socioeconomic History   Marital status: Married    Spouse name: Not on file   Number of children: Not on file   Years of education: Not on file   Highest education level: Not on file  Occupational History   Not on file  Tobacco Use   Smoking status: Former    Types: Cigarettes    Quit date: 01/20/1993    Years since quitting: 29.1   Smokeless tobacco: Never  Vaping Use   Vaping Use: Never used  Substance and Sexual Activity   Alcohol use: Yes    Comment: rarely   Drug use: No   Sexual activity: Yes    Birth control/protection: Post-menopausal  Other Topics Concern   Not on file  Social History Narrative   Not on file   Social Determinants of Health   Financial Resource Strain: Not on file  Food Insecurity: Not on file  Transportation Needs: Not on file  Physical Activity: Not on file  Stress: Not on file  Social Connections: Not on file     Family History: The patient's family history includes Breast cancer in her maternal aunt.  ROS:   Review of Systems  Constitution: Negative for decreased appetite, fever and weight gain.  HENT: Negative for congestion, ear discharge, hoarse voice and sore throat.   Eyes: Negative for discharge, redness, vision loss in right eye and visual halos.  Cardiovascular: Negative for chest  pain, dyspnea on exertion, leg swelling, orthopnea and palpitations.  Respiratory: Negative for cough, hemoptysis, shortness of breath and snoring.   Endocrine: Negative for heat intolerance and polyphagia.  Hematologic/Lymphatic: Negative for bleeding problem. Does not bruise/bleed easily.  Skin: Negative for flushing, nail changes, rash and suspicious lesions.  Musculoskeletal: Negative for arthritis, joint pain, muscle cramps, myalgias, neck pain and stiffness.  Gastrointestinal: Negative for abdominal pain, bowel incontinence, diarrhea and excessive appetite.  Genitourinary: Negative for decreased libido, genital sores and incomplete emptying.  Neurological: Negative for brief paralysis, focal weakness, headaches and loss of balance.  Psychiatric/Behavioral: Negative for altered mental status, depression and suicidal ideas.  Allergic/Immunologic: Negative for HIV exposure and persistent infections.    EKGs/Labs/Other Studies Reviewed:    The following studies were reviewed today:   EKG:  The ekg ordered today demonstrates NSR at 92 bpm  Recent Labs: No results found for requested labs within last 8760 hours.  Recent Lipid Panel No results found for: CHOL, TRIG, HDL, CHOLHDL, VLDL, LDLCALC, LDLDIRECT  Physical Exam:    VS:  BP 118/64   Pulse 92   Ht '4\' 11"'$  (1.499 m)   Wt 141 lb 12.8 oz (64.3 kg)   LMP  (LMP Unknown)   SpO2 92%   BMI 28.64 kg/m     Wt Readings from Last 3 Encounters:  03/08/22 141 lb 12.8 oz (64.3 kg)  07/19/20 123 lb 3.2 oz (55.9 kg)  09/15/19 134 lb 0.6 oz (60.8 kg)     GEN: Well nourished, well developed in no acute distress HEENT: Normal NECK: No JVD; No carotid bruits LYMPHATICS: No lymphadenopathy CARDIAC: systolic precordial murmur grade 2/6, S1S2 audible, normal rate. No rub or gallop noted RESPIRATORY:  Clear to auscultation without rales, wheezing or rhonchi  ABDOMEN: Soft, non-tender, non-distended, +bowel sounds, no  guarding. EXTREMITIES: trace-1+ pitting edema in BLE (L>R), No cyanosis, no clubbing MUSCULOSKELETAL:  No deformity  SKIN: Warm and dry NEUROLOGIC:  Alert and oriented x 3, non-focal PSYCHIATRIC:  Normal affect, good insight  ASSESSMENT:    1. Fatigue, unspecified type   2. Encounter to establish care with new doctor   3. Murmur   4. Mixed hyperlipidemia    PLAN:     Systolic murmur. Seems most consistent with aortic stenosis at this time based on examination. Patient is largely asymptomatic though does note fatigue. Will obtain an echocardiogram to evaluate the valves. Fatigue. History of sleep apnea not using CPAP since significant weight loss previously. Feel that it could be contributing to fatigue. Will evaluate with echocardiogram and then consider sleep study. Hyperlipidemia. Last lipid panel done in 2022, is due next month with PCP. Currently on Lovastatin '10mg'$  daily. Consider increasing to a high-intensity statin if patient can tolerate.  Patient seen and examined, note reviewed with the signed Resident. I personally reviewed laboratory data, imaging studies and relevant notes. I independently examined the patient and formulated the important aspects of the plan. I have personally discussed the plan with the patient and/or family. Comments or changes to the note/plan are indicated below.  The patient is in agreement with the above plan. The patient left the office in stable condition.  The patient will follow up in 6 months  Berniece Salines DO, MS [facility] Attending [place]  [address] [place], [place] [address] [phone] Website: [email or web address]      Medication Adjustments/Labs and Tests Ordered: Current medicines are reviewed at length with the patient today.  Concerns regarding medicines are outlined above.  Orders Placed This Encounter  Procedures   EKG 12-Lead   ECHOCARDIOGRAM COMPLETE   No orders of the  defined types were placed in this encounter.   Patient Instructions  Medication Instructions:  Your physician recommends that you continue on your current medications as directed. Please refer to the Current Medication list given to you today.  *If you need a refill on your cardiac medications before your next appointment, please call your pharmacy*   Lab Work: None If you have labs (blood work) drawn today and your tests are completely normal, you will receive your results only by: [place] (if you have MyChart) OR A paper copy in the mail If you have any lab test that is abnormal or we need to change your treatment, we will call you to review the results.   Testing/Procedures: Your physician has requested that you have an echocardiogram. Echocardiography is a painless test that uses sound waves to create images of your heart. It provides your doctor with information about the size and shape of your heart and how well your heart's chambers and valves are working. This procedure takes approximately one hour. There are no restrictions for this procedure.   Follow-Up: At [facility], you and your health needs are our priority.  As part of our continuing mission to provide you with exceptional heart care, we have created designated Provider Care Teams.  These Care Teams include your primary Cardiologist (physician) and Advanced Practice Providers (APPs -  Physician Assistants and Nurse Practitioners) who all work together to provide you with the care you need, when you need it.  We recommend signing up for the patient portal called "MyChart".  Sign up information is provided on this After Visit Summary.  MyChart is used to connect with patients for  Virtual Visits (Telemedicine).  Patients are able to view lab/test results, encounter notes, upcoming appointments, etc.  Non-urgent messages can be sent to your provider as well.   To learn more about what you can do with MyChart, go to  [email or web address].    Your next appointment:   6 month(s)  The format for your next appointment:   In Person  Provider:   Berniece Salines, DO    Other Instructions   Important Information About Sugar         Adopting a Healthy Lifestyle.  Know what a healthy weight is for you (roughly BMI <25) and aim to maintain this   Aim for 7+ servings of fruits and vegetables daily   65-80+ fluid ounces of water or unsweet tea for healthy kidneys   Limit to max 1 drink of alcohol per day; avoid smoking/tobacco   Limit animal fats in diet for cholesterol and heart health - choose grass fed whenever available   Avoid highly processed foods, and foods high in saturated/trans fats   Aim for low stress - take time to unwind and care for your mental health   Aim for 150 min of moderate intensity exercise weekly for heart health, and weights twice weekly for bone health   Aim for 7-9 hours of sleep daily   When it comes to diets, agreement about the perfect plan isnt easy to find, even among the experts. Experts at the [employer] developed an idea known as the Healthy Eating Plate. Just imagine a plate divided into logical, healthy portions.   The emphasis is on diet quality:   Load up on vegetables and fruits - one-half of your plate: Aim for color and variety, and remember that potatoes dont count.   Go for whole grains - one-quarter of your plate: Whole wheat, barley, wheat berries, quinoa, oats, brown rice, and foods made with them. If you want pasta, go with whole wheat pasta.   Protein power - one-quarter of your plate: Fish, chicken, beans, and nuts are all healthy, versatile protein sources. Limit red meat.   The diet, however, does go beyond the plate, offering a few other suggestions.   Use healthy plant oils, such as olive, canola, soy, corn, sunflower and peanut. Check the labels, and avoid partially hydrogenated oil, which have unhealthy trans  fats.   If youre thirsty, drink water. Coffee and tea are good in moderation, but skip sugary drinks and limit milk and dairy products to one or two daily servings.   The type of carbohydrate in the diet is more important than the amount. Some sources of carbohydrates, such as vegetables, fruits, whole grains, and beans-are healthier than others.   Finally, stay active  Signed, Berniece Salines, DO  03/09/2022 10:14 PM    [place], PGY-2

## 2022-03-09 DIAGNOSIS — Z7689 Persons encountering health services in other specified circumstances: Secondary | ICD-10-CM | POA: Insufficient documentation

## 2022-03-09 DIAGNOSIS — R011 Cardiac murmur, unspecified: Secondary | ICD-10-CM | POA: Insufficient documentation

## 2022-03-09 DIAGNOSIS — E782 Mixed hyperlipidemia: Secondary | ICD-10-CM | POA: Insufficient documentation

## 2022-04-02 ENCOUNTER — Ambulatory Visit (HOSPITAL_COMMUNITY): Payer: Medicare Other | Attending: Cardiovascular Disease

## 2022-04-02 DIAGNOSIS — R011 Cardiac murmur, unspecified: Secondary | ICD-10-CM | POA: Diagnosis not present

## 2022-04-02 LAB — ECHOCARDIOGRAM COMPLETE
AR max vel: 1.18 cm2
AV Area VTI: 1.15 cm2
AV Area mean vel: 1.09 cm2
AV Mean grad: 16 mmHg
AV Peak grad: 29.2 mmHg
Ao pk vel: 2.7 m/s
Area-P 1/2: 2.04 cm2
S' Lateral: 2.9 cm

## 2022-04-05 DIAGNOSIS — F3110 Bipolar disorder, current episode manic without psychotic features, unspecified: Secondary | ICD-10-CM | POA: Diagnosis not present

## 2022-04-10 DIAGNOSIS — M810 Age-related osteoporosis without current pathological fracture: Secondary | ICD-10-CM | POA: Diagnosis not present

## 2022-04-10 DIAGNOSIS — E785 Hyperlipidemia, unspecified: Secondary | ICD-10-CM | POA: Diagnosis not present

## 2022-04-10 DIAGNOSIS — E039 Hypothyroidism, unspecified: Secondary | ICD-10-CM | POA: Diagnosis not present

## 2022-04-10 DIAGNOSIS — E1169 Type 2 diabetes mellitus with other specified complication: Secondary | ICD-10-CM | POA: Diagnosis not present

## 2022-04-10 DIAGNOSIS — M159 Polyosteoarthritis, unspecified: Secondary | ICD-10-CM | POA: Diagnosis not present

## 2022-04-10 DIAGNOSIS — Z862 Personal history of diseases of the blood and blood-forming organs and certain disorders involving the immune mechanism: Secondary | ICD-10-CM | POA: Diagnosis not present

## 2022-04-10 DIAGNOSIS — F3181 Bipolar II disorder: Secondary | ICD-10-CM | POA: Diagnosis not present

## 2022-04-10 DIAGNOSIS — I700 Atherosclerosis of aorta: Secondary | ICD-10-CM | POA: Diagnosis not present

## 2022-04-10 DIAGNOSIS — K219 Gastro-esophageal reflux disease without esophagitis: Secondary | ICD-10-CM | POA: Diagnosis not present

## 2022-05-18 DIAGNOSIS — M25571 Pain in right ankle and joints of right foot: Secondary | ICD-10-CM | POA: Diagnosis not present

## 2022-06-28 DIAGNOSIS — F3110 Bipolar disorder, current episode manic without psychotic features, unspecified: Secondary | ICD-10-CM | POA: Diagnosis not present

## 2022-07-12 DIAGNOSIS — Z23 Encounter for immunization: Secondary | ICD-10-CM | POA: Diagnosis not present

## 2022-09-03 ENCOUNTER — Encounter: Payer: Self-pay | Admitting: Cardiology

## 2022-09-03 ENCOUNTER — Ambulatory Visit: Payer: Medicare Other | Attending: Cardiology

## 2022-09-03 ENCOUNTER — Ambulatory Visit: Payer: Medicare Other | Attending: Cardiology | Admitting: Cardiology

## 2022-09-03 VITALS — BP 128/82 | HR 92 | Ht <= 58 in | Wt 146.2 lb

## 2022-09-03 DIAGNOSIS — R002 Palpitations: Secondary | ICD-10-CM

## 2022-09-03 DIAGNOSIS — I350 Nonrheumatic aortic (valve) stenosis: Secondary | ICD-10-CM | POA: Insufficient documentation

## 2022-09-03 NOTE — Progress Notes (Unsigned)
Enrolled for [employer] to mail a ZIO XT long term holter monitor to the patients address on file.  

## 2022-09-03 NOTE — Patient Instructions (Signed)
Medication Instructions:  Your physician recommends that you continue on your current medications as directed. Please refer to the Current Medication list given to you today.  *If you need a refill on your cardiac medications before your next appointment, please call your pharmacy*   Lab Work: NONE If you have labs (blood work) drawn today and your tests are completely normal, you will receive your results only by: [place] (if you have MyChart) OR A paper copy in the mail If you have any lab test that is abnormal or we need to change your treatment, we will call you to review the results.   Testing/Procedures: [place]- Long Term Monitor Instructions  Your physician has requested you wear a ZIO patch monitor for 14 days.  This is a single patch monitor. [employer] supplies one patch monitor per enrollment. Additional stickers are not available. Please do not apply patch if you will be having a Nuclear Stress Test,  Echocardiogram, Cardiac CT, MRI, or Chest Xray during the period you would be wearing the  monitor. The patch cannot be worn during these tests. You cannot remove and re-apply the  ZIO XT patch monitor.  Your ZIO patch monitor will be mailed 3 day USPS to your address on file. It may take 3-5 days  to receive your monitor after you have been enrolled.  Once you have received your monitor, please review the enclosed instructions. Your monitor  has already been registered assigning a specific monitor serial # to you.  Billing and Patient Assistance Program Information  We have supplied [employer] with any of your insurance information on file for billing purposes. [employer] offers a sliding scale Patient Assistance Program for patients that do not have  insurance, or whose insurance does not completely cover the cost of the ZIO monitor.  You must apply for the Patient Assistance Program to qualify for this discounted rate.  To apply, please call [employer] at [phone], select  option 4, select option 2, ask to apply for  Patient Assistance Program. Theodore Demark will ask your household income, and how many people  are in your household. They will quote your out-of-pocket cost based on that information.  [employer] will also be able to set up a 13-month interest-free payment plan if needed.  Applying the monitor Shave hair from upper left chest.  Hold abrader disc by orange tab. Rub abrader in 40 strokes over the upper left chest as  indicated in your monitor instructions.  Clean area with 4 enclosed alcohol pads. Let dry.  Apply patch as indicated in monitor instructions. Patch will be placed under collarbone on left  side of chest with arrow pointing upward.  Rub patch adhesive wings for 2 minutes. Remove white label marked "1". Remove the white  label marked "2". Rub patch adhesive wings for 2 additional minutes.  While looking in a mirror, press and release button in center of patch. A small green light will  flash 3-4 times. This will be your only indicator that the monitor has been turned on.  Do not shower for the first 24 hours. You may shower after the first 24 hours.  Press the button if you feel a symptom. You will hear a small click. Record Date, Time and  Symptom in the Patient Logbook.  When you are ready to remove the patch, follow instructions on the last 2 pages of Patient  Logbook. Stick patch monitor onto the last page of Patient Logbook.  Place Patient Logbook in the blue  and white box. Use locking tab on box and tape box closed  securely. The blue and white box has prepaid postage on it. Please place it in the mailbox as  soon as possible. Your physician should have your test results approximately 7 days after the  monitor has been mailed back to [facility].  Call [place] at [phone] if you have questions regarding  your ZIO XT patch monitor. Call them immediately if you see an orange light blinking on your  monitor.   If your monitor falls off in less than 4 days, contact our Monitor department at [phone].  If your monitor becomes loose or falls off after 4 days call [employer] at [phone] for  suggestions on securing your monitor    Follow-Up: At [facility], you and your health needs are our priority.  As part of our continuing mission to provide you with exceptional heart care, we have created designated Provider Care Teams.  These Care Teams include your primary Cardiologist (physician) and Advanced Practice Providers (APPs -  Physician Assistants and Nurse Practitioners) who all work together to provide you with the care you need, when you need it.  We recommend signing up for the patient portal called "MyChart".  Sign up information is provided on this After Visit Summary.  MyChart is used to connect with patients for Virtual Visits (Telemedicine).  Patients are able to view lab/test results, encounter notes, upcoming appointments, etc.  Non-urgent messages can be sent to your provider as well.   To learn more about what you can do with MyChart, go to [email or web address].    Your next appointment:   6 month(s)  The format for your next appointment:   In Person  Provider:   Berniece Salines, DO

## 2022-09-03 NOTE — Progress Notes (Signed)
Cardiology Office Note:    Date:  09/03/2022   ID:  Alexandra Morrison, DOB 09-08-1946, MRN [id number]  PCP:  Aurea Graff, PA-C (Inactive)  Cardiologist:  Berniece Salines, DO  Electrophysiologist:  None   Referring MD: No ref. provider found   "I been feeling some palpitations and had to get short of breath"  History of Present Illness:    Alexandra Morrison is a 76 y.o. female with a hx of mild aortic stenosis, diabetes mellitus, hyperlipidemia, bipolar disorder and sleep apnea.  Since her last visit she tells me that she is experiencing worsening shortness of breath but usually after she has had intermittent palpitations.  No chest pain.   Past Medical History:  Diagnosis Date   Abdominal aortic atherosclerosis (HCC)    Moderate   Anxiety    Arthritis    Bipolar disorder (HCC)    type 2 hypomanic   Complication of anesthesia    pt request no spinal anesthesia only general   DDD (degenerative disc disease), lumbar    High cholesterol    History of diabetes mellitus    resolved after weight loss   History of mumps as a child    History of sleep apnea    most recent study was negative for sleep apnea, resolved after weight loss    Hypothyroidism    Myelopathy (HCC)    Numbness and tingling in hands    Osteopenia    Osteoporosis    Right arm fracture 1953   x2   Scoliosis    per pt   Spinal stenosis    Moderate to severe   Vertigo    Wears glasses     Past Surgical History:  Procedure Laterality Date   APPENDECTOMY     CARPAL TUNNEL RELEASE Bilateral    CESAREAN SECTION  1971, 1973   x2   COLONOSCOPY WITH PROPOFOL N/A 09/14/2019   Procedure: COLONOSCOPY WITH PROPOFOL;  Surgeon: Wilford Corner, MD;  Location: [facility] ENDOSCOPY;  Service: Endoscopy;  Laterality: N/A;   ESOPHAGOGASTRODUODENOSCOPY (EGD) WITH PROPOFOL N/A 09/14/2019   Procedure: ESOPHAGOGASTRODUODENOSCOPY (EGD) WITH PROPOFOL;  Surgeon: Wilford Corner, MD;  Location: [facility];  Service:  Endoscopy;  Laterality: N/A;   EYE SURGERY Bilateral 2006   cataracts   FRACTURE SURGERY     Arm   GASTRIC BYPASS  2006   POSTERIOR CERVICAL FUSION/FORAMINOTOMY N/A 11/25/2015   Procedure: Posterior Cervical Fusion with lateral mass fixation Cervical fiv-Thoracic one, cervical laminectomy  - Cervical six-Cervical seven - Cervical seven-Thoracic one;  Surgeon: Eustace Moore, MD;  Location: [facility];  Service: Neurosurgery;  Laterality: N/A;   TOTAL KNEE ARTHROPLASTY Right 07/11/2017   Procedure: RIGHT TOTAL KNEE ARTHROPLASTY;  Surgeon: Susa Day, MD;  Location: [facility];  Service: Orthopedics;  Laterality: Right;  150 mins   TOTAL KNEE ARTHROPLASTY Left 03/12/2018   Procedure: LEFT TOTAL KNEE ARTHROPLASTY;  Surgeon: Susa Day, MD;  Location: [facility];  Service: Orthopedics;  Laterality: Left;  Adductor Block   TOTAL SHOULDER ARTHROPLASTY Bilateral 2010, 2011    Current Medications: Current Meds  Medication Sig   acetaminophen (TYLENOL) 500 MG tablet Take 1,000 mg by mouth See admin instructions. Take two tablets (1000 mg) by mouth every morning, may also take two tablets (1000 mg) every 6 hours as needed for pain   alendronate (FOSAMAX) 70 MG tablet Take 70 mg by mouth every Monday. Take with a full glass of water on an empty stomach.   ALPRAZolam (  XANAX) 0.5 MG tablet Take 0.5 mg by mouth 3 (three) times daily as needed for anxiety.    amphetamine-dextroamphetamine (ADDERALL) 30 MG tablet Take 30 mg by mouth 2 (two) times daily with breakfast and lunch.    buPROPion (WELLBUTRIN XL) 300 MG 24 hr tablet Take 300 mg by mouth daily with breakfast. Take with a 150 mg tablet for a total dose of 450 mg   calcium carbonate (OS-CAL) 600 MG tablet Take 600 mg by mouth 2 (two) times daily.   Coenzyme Q10 300 MG CAPS Take 300 mg by mouth daily with lunch.   divalproex (DEPAKOTE ER) 500 MG 24 hr tablet Take 1,000 mg by mouth daily with breakfast.    ferrous sulfate 325 (65 FE) MG tablet Take 325  mg by mouth at bedtime.   levothyroxine (SYNTHROID, LEVOTHROID) 50 MCG tablet Take 50 mcg by mouth daily with breakfast.    lovastatin (MEVACOR) 10 MG tablet Take 10 mg by mouth daily with supper.   methocarbamol (ROBAXIN) 500 MG tablet Take 1 tablet (500 mg total) by mouth every 6 (six) hours as needed for muscle spasms. (Patient taking differently: Take 500 mg by mouth 2 (two) times daily as needed for muscle spasms.)   Multiple Vitamins-Minerals (MULTIVITAMIN WITH MINERALS) tablet Take 1 tablet by mouth daily with lunch.    Omega-3 Fatty Acids (FISH OIL) 1000 MG CAPS Take 1,000 mg by mouth every morning.   pantoprazole (PROTONIX) 40 MG tablet Take 1 tablet (40 mg total) by mouth 2 (two) times daily.   traZODone (DESYREL) 100 MG tablet Take 100 mg by mouth at bedtime as needed for sleep.      Allergies:   Propofol   Social History   Socioeconomic History   Marital status: Married    Spouse name: Not on file   Number of children: Not on file   Years of education: Not on file   Highest education level: Not on file  Occupational History   Not on file  Tobacco Use   Smoking status: Former    Types: Cigarettes    Quit date: 01/20/1993    Years since quitting: 29.6   Smokeless tobacco: Never  Vaping Use   Vaping Use: Never used  Substance and Sexual Activity   Alcohol use: Yes    Comment: rarely   Drug use: No   Sexual activity: Yes    Birth control/protection: Post-menopausal  Other Topics Concern   Not on file  Social History Narrative   Not on file   Social Determinants of Health   Financial Resource Strain: Not on file  Food Insecurity: Not on file  Transportation Needs: Not on file  Physical Activity: Not on file  Stress: Not on file  Social Connections: Not on file     Family History: The patient's family history includes Breast cancer in her maternal aunt.  ROS:   Review of Systems  Constitution: Negative for decreased appetite, fever and weight gain.  HENT:  Negative for congestion, ear discharge, hoarse voice and sore throat.   Eyes: Negative for discharge, redness, vision loss in right eye and visual halos.  Cardiovascular: Negative for chest pain, dyspnea on exertion, leg swelling, orthopnea and palpitations.  Respiratory: Negative for cough, hemoptysis, shortness of breath and snoring.   Endocrine: Negative for heat intolerance and polyphagia.  Hematologic/Lymphatic: Negative for bleeding problem. Does not bruise/bleed easily.  Skin: Negative for flushing, nail changes, rash and suspicious lesions.  Musculoskeletal: Negative for arthritis, joint pain,  muscle cramps, myalgias, neck pain and stiffness.  Gastrointestinal: Negative for abdominal pain, bowel incontinence, diarrhea and excessive appetite.  Genitourinary: Negative for decreased libido, genital sores and incomplete emptying.  Neurological: Negative for brief paralysis, focal weakness, headaches and loss of balance.  Psychiatric/Behavioral: Negative for altered mental status, depression and suicidal ideas.  Allergic/Immunologic: Negative for HIV exposure and persistent infections.    EKGs/Labs/Other Studies Reviewed:    The following studies were reviewed today:   EKG:  None today  TTE 04/02/2022     ECHOCARDIOGRAM REPORT       Patient Name:   TRENA DUNAVAN [place] Date of Exam: 04/02/2022 Medical Rec #:  [id number]        Height:       59.0 in Accession #:    [id number]       Weight:       141.8 lb Date of Birth:  Mar 23, 1946       BSA:          1.594 m Patient Age:    70 years         BP:           118/64 mmHg Patient Gender: F                HR:           78 bpm. Exam Location:  [address]  Procedure: 2D Echo, Cardiac Doppler and Color Doppler  Indications:    R01.1 Murmur   History:        Patient has no prior history of Echocardiogram examinations.                 Signs/Symptoms:Murmur; Risk Factors:Dyslipidemia, Diabetes and                 Former Smoker.    Sonographer:    Coralyn Helling RDCS Referring Phys: [id number] [place]    1. The aortic valve is tricuspid. There is mild calcification of the aortic valve. There is mild thickening of the aortic valve. Aortic valve regurgitation is not visualized. Mild aortic valve stenosis. Aortic valve mean gradient measures 16.0 mmHg. Aortic valve Vmax measures 2.70 m/s.  2. Left ventricular ejection fraction, by estimation, is 60 to 65%. The left ventricle has normal function. The left ventricle has no regional wall motion abnormalities. There is mild asymmetric left ventricular hypertrophy of the basal-septal segment. Left ventricular diastolic parameters are consistent with Grade II diastolic dysfunction (pseudonormalization).  3. Right ventricular systolic function is normal. The right ventricular size is normal. There is normal pulmonary artery systolic pressure. The estimated right ventricular systolic pressure is 97.4 mmHg.  4. Left atrial size was mildly dilated.  5. The mitral valve is degenerative. Mild mitral valve regurgitation. No evidence of mitral stenosis. Moderate mitral annular calcification.  6. The inferior vena cava is normal in size with greater than 50% respiratory variability, suggesting right atrial pressure of 3 mmHg.  FINDINGS  Left Ventricle: Left ventricular ejection fraction, by estimation, is 60 to 65%. The left ventricle has normal function. The left ventricle has no regional wall motion abnormalities. The left ventricular internal cavity size was normal in size. There is  mild asymmetric left ventricular hypertrophy of the basal-septal segment. Left ventricular diastolic parameters are consistent with Grade II diastolic dysfunction (pseudonormalization).  Right Ventricle: The right ventricular size is normal. No increase in right ventricular wall thickness. Right ventricular systolic function is normal. There is normal pulmonary artery  systolic pressure. The tricuspid regurgitant velocity is 2.60 m/s, and  with an assumed right atrial pressure of 3 mmHg, the estimated right ventricular systolic pressure is 09.4 mmHg.  Left Atrium: Left atrial size was mildly dilated.  Right Atrium: Right atrial size was normal in size.  Pericardium: There is no evidence of pericardial effusion.  Mitral Valve: The mitral valve is degenerative in appearance. Moderate mitral annular calcification. Mild mitral valve regurgitation. No evidence of mitral valve stenosis.  Tricuspid Valve: The tricuspid valve is grossly normal. Tricuspid valve regurgitation is mild . No evidence of tricuspid stenosis.  Aortic Valve: The aortic valve is tricuspid. There is mild calcification of the aortic valve. There is mild thickening of the aortic valve. Aortic valve regurgitation is not visualized. Mild aortic stenosis is present. Aortic valve mean gradient measures  16.0 mmHg. Aortic valve peak gradient measures 29.2 mmHg. Aortic valve area, by VTI measures 1.15 cm.  Pulmonic Valve: The pulmonic valve was grossly normal. Pulmonic valve regurgitation is not visualized. No evidence of pulmonic stenosis.  Aorta: The aortic root and ascending aorta are structurally normal, with no evidence of dilitation.  Venous: The inferior vena cava is normal in size with greater than 50% respiratory variability, suggesting right atrial pressure of 3 mmHg.  IAS/Shunts: The atrial septum is grossly normal.     Recent Labs: No results found for requested labs within last 365 days.  Recent Lipid Panel No results found for: "CHOL", "TRIG", "HDL", "CHOLHDL", "VLDL", "LDLCALC", "LDLDIRECT"  Physical Exam:    VS:  BP 128/82   Pulse 92   Ht '4\' 10"'$  (1.473 m)   Wt 146 lb 3.2 oz (66.3 kg)   LMP  (LMP Unknown)   SpO2 98%   BMI 30.56 kg/m     Wt Readings from Last 3 Encounters:  09/03/22 146 lb 3.2 oz (66.3 kg)  03/08/22 141 lb 12.8 oz (64.3 kg)  07/19/20  123 lb 3.2 oz (55.9 kg)     GEN: Well nourished, well developed in no acute distress HEENT: Normal NECK: No JVD; No carotid bruits LYMPHATICS: No lymphadenopathy CARDIAC: S1S2 noted,RRR, no murmurs, rubs, gallops RESPIRATORY:  Clear to auscultation without rales, wheezing or rhonchi  ABDOMEN: Soft, non-tender, non-distended, +bowel sounds, no guarding. EXTREMITIES: No edema, No cyanosis, no clubbing MUSCULOSKELETAL:  No deformity  SKIN: Warm and dry NEUROLOGIC:  Alert and oriented x 3, non-focal PSYCHIATRIC:  Normal affect, good insight  ASSESSMENT:    1. Palpitations   2. Mild aortic stenosis    PLAN:     She has risk factor for atrial fibrillation I like to place a monitor on the patient to make sure that her palpitations and symptoms are not related to paroxysmal atrial fibrillation. We again went over her echo report to monitor.   The patient is in agreement with the above plan. The patient left the office in stable condition.  The patient will follow up in   Medication Adjustments/Labs and Tests Ordered: Current medicines are reviewed at length with the patient today.  Concerns regarding medicines are outlined above.  Orders Placed This Encounter  Procedures   LONG TERM MONITOR (3-14 DAYS)   No orders of the defined types were placed in this encounter.   Patient Instructions  Medication Instructions:  Your physician recommends that you continue on your current medications as directed. Please refer to the Current Medication list given to you today.  *If you need a refill on your cardiac medications before your next appointment,  please call your pharmacy*   Lab Work: NONE If you have labs (blood work) drawn today and your tests are completely normal, you will receive your results only by: [place] (if you have MyChart) OR A paper copy in the mail If you have any lab test that is abnormal or we need to change your treatment, we will call you to review the  results.   Testing/Procedures: [place] [place]- Long Term Monitor Instructions  Your physician has requested you wear a ZIO patch monitor for 14 days.  This is a single patch monitor. [employer] supplies one patch monitor per enrollment. Additional stickers are not available. Please do not apply patch if you will be having a Nuclear Stress Test,  Echocardiogram, Cardiac CT, MRI, or Chest Xray during the period you would be wearing the  monitor. The patch cannot be worn during these tests. You cannot remove and re-apply the  ZIO XT patch monitor.  Your ZIO patch monitor will be mailed 3 day USPS to your address on file. It may take 3-5 days  to receive your monitor after you have been enrolled.  Once you have received your monitor, please review the enclosed instructions. Your monitor  has already been registered assigning a specific monitor serial # to you.  Billing and Patient Assistance Program Information  We have supplied [employer] with any of your insurance information on file for billing purposes. [employer] offers a sliding scale Patient Assistance Program for patients that do not have  insurance, or whose insurance does not completely cover the cost of the ZIO monitor.  You must apply for the Patient Assistance Program to qualify for this discounted rate.  To apply, please call [employer] at [phone], select option 4, select option 2, ask to apply for  Patient Assistance Program. Theodore Demark will ask your household income, and how many people  are in your household. They will quote your out-of-pocket cost based on that information.  [employer] will also be able to set up a 72-month interest-free payment plan if needed.  Applying the monitor Shave hair from upper left chest.  Hold abrader disc by orange tab. Rub abrader in 40 strokes over the upper left chest as  indicated in your monitor instructions.  Clean area with 4 enclosed alcohol pads. Let dry.  Apply patch as indicated in monitor  instructions. Patch will be placed under collarbone on left  side of chest with arrow pointing upward.  Rub patch adhesive wings for 2 minutes. Remove white label marked "1". Remove the white  label marked "2". Rub patch adhesive wings for 2 additional minutes.  While looking in a mirror, press and release button in center of patch. A small green light will  flash 3-4 times. This will be your only indicator that the monitor has been turned on.  Do not shower for the first 24 hours. You may shower after the first 24 hours.  Press the button if you feel a symptom. You will hear a small click. Record Date, Time and  Symptom in the Patient Logbook.  When you are ready to remove the patch, follow instructions on the last 2 pages of Patient  Logbook. Stick patch monitor onto the last page of Patient Logbook.  Place Patient Logbook in the blue and white box. Use locking tab on box and tape box closed  securely. The blue and white box has prepaid postage on it. Please place it in the mailbox as  soon as possible. Your physician should have  your test results approximately 7 days after the  monitor has been mailed back to [facility].  Call [place] at [phone] if you have questions regarding  your ZIO XT patch monitor. Call them immediately if you see an orange light blinking on your  monitor.  If your monitor falls off in less than 4 days, contact our Monitor department at [phone].  If your monitor becomes loose or falls off after 4 days call [employer] at [phone] for  suggestions on securing your monitor    Follow-Up: At [facility], you and your health needs are our priority.  As part of our continuing mission to provide you with exceptional heart care, we have created designated Provider Care Teams.  These Care Teams include your primary Cardiologist (physician) and Advanced Practice Providers (APPs -  Physician Assistants and Nurse Practitioners) who  all work together to provide you with the care you need, when you need it.  We recommend signing up for the patient portal called "MyChart".  Sign up information is provided on this After Visit Summary.  MyChart is used to connect with patients for Virtual Visits (Telemedicine).  Patients are able to view lab/test results, encounter notes, upcoming appointments, etc.  Non-urgent messages can be sent to your provider as well.   To learn more about what you can do with MyChart, go to [email or web address].    Your next appointment:   6 month(s)  The format for your next appointment:   In Person  Provider:   Berniece Salines, DO     Adopting a Healthy Lifestyle.  Know what a healthy weight is for you (roughly BMI <25) and aim to maintain this   Aim for 7+ servings of fruits and vegetables daily   65-80+ fluid ounces of water or unsweet tea for healthy kidneys   Limit to max 1 drink of alcohol per day; avoid smoking/tobacco   Limit animal fats in diet for cholesterol and heart health - choose grass fed whenever available   Avoid highly processed foods, and foods high in saturated/trans fats   Aim for low stress - take time to unwind and care for your mental health   Aim for 150 min of moderate intensity exercise weekly for heart health, and weights twice weekly for bone health   Aim for 7-9 hours of sleep daily   When it comes to diets, agreement about the perfect plan isnt easy to find, even among the experts. Experts at the [employer] developed an idea known as the Healthy Eating Plate. Just imagine a plate divided into logical, healthy portions.   The emphasis is on diet quality:   Load up on vegetables and fruits - one-half of your plate: Aim for color and variety, and remember that potatoes dont count.   Go for whole grains - one-quarter of your plate: Whole wheat, barley, wheat berries, quinoa, oats, brown rice, and foods made with them. If you want  pasta, go with whole wheat pasta.   Protein power - one-quarter of your plate: Fish, chicken, beans, and nuts are all healthy, versatile protein sources. Limit red meat.   The diet, however, does go beyond the plate, offering a few other suggestions.   Use healthy plant oils, such as olive, canola, soy, corn, sunflower and peanut. Check the labels, and avoid partially hydrogenated oil, which have unhealthy trans fats.   If youre thirsty, drink water. Coffee and tea are good in moderation, but skip sugary  drinks and limit milk and dairy products to one or two daily servings.   The type of carbohydrate in the diet is more important than the amount. Some sources of carbohydrates, such as vegetables, fruits, whole grains, and beans-are healthier than others.   Finally, stay active  Signed, Berniece Salines, DO  09/03/2022 12:02 PM    [place]

## 2022-09-05 ENCOUNTER — Ambulatory Visit: Payer: Medicare Other | Admitting: Cardiology

## 2022-09-06 DIAGNOSIS — R002 Palpitations: Secondary | ICD-10-CM

## 2022-09-27 DIAGNOSIS — F3110 Bipolar disorder, current episode manic without psychotic features, unspecified: Secondary | ICD-10-CM | POA: Diagnosis not present

## 2022-09-27 DIAGNOSIS — R002 Palpitations: Secondary | ICD-10-CM | POA: Diagnosis not present

## 2022-10-09 DIAGNOSIS — E039 Hypothyroidism, unspecified: Secondary | ICD-10-CM | POA: Diagnosis not present

## 2022-10-09 DIAGNOSIS — E785 Hyperlipidemia, unspecified: Secondary | ICD-10-CM | POA: Diagnosis not present

## 2022-10-09 DIAGNOSIS — D649 Anemia, unspecified: Secondary | ICD-10-CM | POA: Diagnosis not present

## 2022-10-09 DIAGNOSIS — M810 Age-related osteoporosis without current pathological fracture: Secondary | ICD-10-CM | POA: Diagnosis not present

## 2022-10-09 DIAGNOSIS — N1831 Chronic kidney disease, stage 3a: Secondary | ICD-10-CM | POA: Diagnosis not present

## 2022-10-09 DIAGNOSIS — Z23 Encounter for immunization: Secondary | ICD-10-CM | POA: Diagnosis not present

## 2022-10-09 DIAGNOSIS — R600 Localized edema: Secondary | ICD-10-CM | POA: Diagnosis not present

## 2022-10-09 DIAGNOSIS — M79671 Pain in right foot: Secondary | ICD-10-CM | POA: Diagnosis not present

## 2022-10-09 DIAGNOSIS — K279 Peptic ulcer, site unspecified, unspecified as acute or chronic, without hemorrhage or perforation: Secondary | ICD-10-CM | POA: Diagnosis not present

## 2022-10-09 DIAGNOSIS — M159 Polyosteoarthritis, unspecified: Secondary | ICD-10-CM | POA: Diagnosis not present

## 2022-10-09 DIAGNOSIS — F3181 Bipolar II disorder: Secondary | ICD-10-CM | POA: Diagnosis not present

## 2022-10-09 DIAGNOSIS — I700 Atherosclerosis of aorta: Secondary | ICD-10-CM | POA: Diagnosis not present

## 2022-10-09 DIAGNOSIS — E1169 Type 2 diabetes mellitus with other specified complication: Secondary | ICD-10-CM | POA: Diagnosis not present

## 2022-10-12 ENCOUNTER — Telehealth: Payer: Self-pay | Admitting: Cardiology

## 2022-10-12 MED ORDER — METOPROLOL SUCCINATE ER 25 MG PO TB24: 25.0000 mg | ORAL_TABLET | Freq: Every day | ORAL | 3 refills | Status: DC

## 2022-10-12 MED ORDER — METOPROLOL SUCCINATE ER 25 MG PO TB24: 12.5000 mg | ORAL_TABLET | Freq: Every day | ORAL | 3 refills | Status: DC

## 2022-10-12 NOTE — Telephone Encounter (Signed)
Patient was returning call for results. Please advise °

## 2022-10-12 NOTE — Telephone Encounter (Signed)
Gave patient monitor results per Dr. Harriet Masson: "Your monitor showed evidence of paroxysmal supraventricular tachycardia.  I like to start you on low-dose beta-blocker Toprol-XL 12.5 mg daily.  With the start of this medication I like to see you in 3 months." Explained Toprol and PSVT to patient.  Appointment made with Dr. Harriet Masson on 01/11/23. Prescription sent to patient's preferred pharmacy.

## 2022-12-24 DIAGNOSIS — F3110 Bipolar disorder, current episode manic without psychotic features, unspecified: Secondary | ICD-10-CM | POA: Diagnosis not present

## 2023-01-11 ENCOUNTER — Encounter: Payer: Self-pay | Admitting: Cardiology

## 2023-01-11 ENCOUNTER — Ambulatory Visit: Payer: Medicare Other | Attending: Cardiology | Admitting: Cardiology

## 2023-01-11 VITALS — BP 102/60 | HR 86 | Ht 59.0 in | Wt 148.0 lb

## 2023-01-11 DIAGNOSIS — E559 Vitamin D deficiency, unspecified: Secondary | ICD-10-CM

## 2023-01-11 DIAGNOSIS — I350 Nonrheumatic aortic (valve) stenosis: Secondary | ICD-10-CM | POA: Diagnosis not present

## 2023-01-11 DIAGNOSIS — R0609 Other forms of dyspnea: Secondary | ICD-10-CM

## 2023-01-11 DIAGNOSIS — Z79899 Other long term (current) drug therapy: Secondary | ICD-10-CM

## 2023-01-11 DIAGNOSIS — I4719 Other supraventricular tachycardia: Secondary | ICD-10-CM

## 2023-01-11 DIAGNOSIS — R5383 Other fatigue: Secondary | ICD-10-CM | POA: Diagnosis not present

## 2023-01-11 MED ORDER — FUROSEMIDE 20 MG PO TABS: 20.0000 mg | ORAL_TABLET | ORAL | 3 refills | Status: DC

## 2023-01-11 MED ORDER — POTASSIUM CHLORIDE ER 10 MEQ PO TBCR: 10.0000 meq | EXTENDED_RELEASE_TABLET | Freq: Every day | ORAL | 3 refills | Status: DC

## 2023-01-11 NOTE — Progress Notes (Signed)
Cardiology Office Note:    Date:  01/11/2023   ID:  Alexandra Morrison, DOB February 10, 1946, MRN [id number]  PCP:  Aurea Graff, PA-C (Inactive)  Cardiologist:  Berniece Salines, DO  Electrophysiologist:  None   Referring MD: No ref. provider found   "I been feeling some palpitations and had to get short of breath"  History of Present Illness:    Alexandra Morrison is a 77 y.o. female with a hx of mild aortic stenosis, diabetes mellitus, hyperlipidemia, bipolar disorder and sleep apnea.  Since her last visit she tells me that she is experiencing worsening shortness of breath/ The shortness of breath is now really concerning for her.   Since her last visit the zio monitor shower PSVT - toprol xl 12.5 mg was started. It has helped the palpations but she is still with worsening shortness of breath. No chest pain.   Past Medical History:  Diagnosis Date   Abdominal aortic atherosclerosis (HCC)    Moderate   Anxiety    Arthritis    Bipolar disorder (HCC)    type 2 hypomanic   Complication of anesthesia    pt request no spinal anesthesia only general   DDD (degenerative disc disease), lumbar    High cholesterol    History of diabetes mellitus    resolved after weight loss   History of mumps as a child    History of sleep apnea    most recent study was negative for sleep apnea, resolved after weight loss    Hypothyroidism    Myelopathy (HCC)    Numbness and tingling in hands    Osteopenia    Osteoporosis    Right arm fracture 1953   x2   Scoliosis    per pt   Spinal stenosis    Moderate to severe   Vertigo    Wears glasses     Past Surgical History:  Procedure Laterality Date   APPENDECTOMY     CARPAL TUNNEL RELEASE Bilateral    CESAREAN SECTION  1971, 1973   x2   COLONOSCOPY WITH PROPOFOL N/A 09/14/2019   Procedure: COLONOSCOPY WITH PROPOFOL;  Surgeon: Wilford Corner, MD;  Location: [facility] ENDOSCOPY;  Service: Endoscopy;  Laterality: N/A;   ESOPHAGOGASTRODUODENOSCOPY  (EGD) WITH PROPOFOL N/A 09/14/2019   Procedure: ESOPHAGOGASTRODUODENOSCOPY (EGD) WITH PROPOFOL;  Surgeon: Wilford Corner, MD;  Location: [facility];  Service: Endoscopy;  Laterality: N/A;   EYE SURGERY Bilateral 2006   cataracts   FRACTURE SURGERY     Arm   GASTRIC BYPASS  2006   POSTERIOR CERVICAL FUSION/FORAMINOTOMY N/A 11/25/2015   Procedure: Posterior Cervical Fusion with lateral mass fixation Cervical fiv-Thoracic one, cervical laminectomy  - Cervical six-Cervical seven - Cervical seven-Thoracic one;  Surgeon: Eustace Moore, MD;  Location: [facility];  Service: Neurosurgery;  Laterality: N/A;   TOTAL KNEE ARTHROPLASTY Right 07/11/2017   Procedure: RIGHT TOTAL KNEE ARTHROPLASTY;  Surgeon: Susa Day, MD;  Location: [facility] ORS;  Service: Orthopedics;  Laterality: Right;  150 mins   TOTAL KNEE ARTHROPLASTY Left 03/12/2018   Procedure: LEFT TOTAL KNEE ARTHROPLASTY;  Surgeon: Susa Day, MD;  Location: [facility] ORS;  Service: Orthopedics;  Laterality: Left;  Adductor Block   TOTAL SHOULDER ARTHROPLASTY Bilateral 2010, 2011    Current Medications: Current Meds  Medication Sig   acetaminophen (TYLENOL) 500 MG tablet Take 1,000 mg by mouth See admin instructions. Take two tablets (1000 mg) by mouth every morning, may also take two tablets (1000 mg) every 6  hours as needed for pain   alendronate (FOSAMAX) 70 MG tablet Take 70 mg by mouth every Monday. Take with a full glass of water on an empty stomach.   ALPRAZolam (XANAX) 0.5 MG tablet Take 0.5 mg by mouth 3 (three) times daily as needed for anxiety.    amphetamine-dextroamphetamine (ADDERALL) 30 MG tablet Take 30 mg by mouth 2 (two) times daily with breakfast and lunch.    buPROPion (WELLBUTRIN XL) 150 MG 24 hr tablet Take 150 mg by mouth daily with breakfast. Take with a 300 mg tablet for a total dose of 450 mg   buPROPion (WELLBUTRIN XL) 300 MG 24 hr tablet Take 300 mg by mouth daily with breakfast. Take with a 150 mg tablet for a total  dose of 450 mg   calcium carbonate (OS-CAL) 600 MG tablet Take 600 mg by mouth 2 (two) times daily.   divalproex (DEPAKOTE ER) 500 MG 24 hr tablet Take 1,000 mg by mouth daily with breakfast.    ferrous sulfate 325 (65 FE) MG tablet Take 325 mg by mouth at bedtime.   levothyroxine (SYNTHROID, LEVOTHROID) 50 MCG tablet Take 50 mcg by mouth daily with breakfast.    lovastatin (MEVACOR) 10 MG tablet Take 10 mg by mouth daily with supper.   Magnesium 500 MG TABS Take 500 mg by mouth daily after supper.   methocarbamol (ROBAXIN) 500 MG tablet Take 1 tablet (500 mg total) by mouth every 6 (six) hours as needed for muscle spasms. (Patient taking differently: Take 500 mg by mouth 2 (two) times daily as needed for muscle spasms.)   metoprolol succinate (TOPROL XL) 25 MG 24 hr tablet Take 0.5 tablets (12.5 mg total) by mouth daily.   Multiple Vitamins-Minerals (MULTIVITAMIN WITH MINERALS) tablet Take 1 tablet by mouth daily with lunch.    potassium chloride (KLOR-CON) 10 MEQ tablet Take 1 tablet (10 mEq total) by mouth daily.   traZODone (DESYREL) 100 MG tablet Take 100 mg by mouth at bedtime as needed for sleep.    [DISCONTINUED] Cholecalciferol (VITAMIN D) 50 MCG (2000 UT) CAPS Take 2,000 Units by mouth daily after lunch.    [DISCONTINUED] furosemide (LASIX) 20 MG tablet Take 20 mg by mouth 2 (two) times daily as needed for fluid or edema.      Allergies:   Propofol   Social History   Socioeconomic History   Marital status: Married    Spouse name: Not on file   Number of children: Not on file   Years of education: Not on file   Highest education level: Not on file  Occupational History   Not on file  Tobacco Use   Smoking status: Former    Types: Cigarettes    Quit date: 01/20/1993    Years since quitting: 29.9   Smokeless tobacco: Never  Vaping Use   Vaping Use: Never used  Substance and Sexual Activity   Alcohol use: Yes    Comment: rarely   Drug use: No   Sexual activity: Yes     Birth control/protection: Post-menopausal  Other Topics Concern   Not on file  Social History Narrative   Not on file   Social Determinants of Health   Financial Resource Strain: Not on file  Food Insecurity: Not on file  Transportation Needs: Not on file  Physical Activity: Not on file  Stress: Not on file  Social Connections: Not on file     Family History: The patient's family history includes Breast cancer in her  maternal aunt.  ROS:   Review of Systems  Constitution: Negative for decreased appetite, fever and weight gain.  HENT: Negative for congestion, ear discharge, hoarse voice and sore throat.   Eyes: Negative for discharge, redness, vision loss in right eye and visual halos.  Cardiovascular: Report shortness of breath. Negative for chest pain, leg swelling, orthopnea and palpitations.  Respiratory: Negative for cough, hemoptysis, shortness of breath and snoring.   Endocrine: Negative for heat intolerance and polyphagia.  Hematologic/Lymphatic: Negative for bleeding problem. Does not bruise/bleed easily.  Skin: Negative for flushing, nail changes, rash and suspicious lesions.  Musculoskeletal: Negative for arthritis, joint pain, muscle cramps, myalgias, neck pain and stiffness.  Gastrointestinal: Negative for abdominal pain, bowel incontinence, diarrhea and excessive appetite.  Genitourinary: Negative for decreased libido, genital sores and incomplete emptying.  Neurological: Negative for brief paralysis, focal weakness, headaches and loss of balance.  Psychiatric/Behavioral: Negative for altered mental status, depression and suicidal ideas.  Allergic/Immunologic: Negative for HIV exposure and persistent infections.   EKGs/Labs/Other Studies Reviewed:    The following studies were reviewed today:   EKG:  None today   Zio monitor 01/11/2023 Patch Wear Time:  13 days and 22 hours (2023-11-16T10:28:08-499 to 2023-11-30T09:14:58-0500)   Patient had a min HR of 57  bpm, max HR of 200 bpm, and avg HR of 86 bpm.    Predominant underlying rhythm was Sinus Rhythm.    16 Supraventricular Tachycardia runs occurred, the run with the fastest interval lasting 6 beats with a max rate of 200 bpm, the longest lasting 15.4 secs with an avg rate of 104 bpm.  Some episodes of Supraventricular Tachycardia may be possible Atrial Tachycardia with variable block. Isolated SVEs were rare (<1.0%), SVE Couplets were rare (<1.0%), and SVE Triplets were rare (<1.0%). Isolated VEs were rare (<1.0%), VE Couplets were rare (<1.0%), and no VE Triplets were present.   Symptoms associated with Sinus Rhythm.   Conclusion: The study shows evidence of paroxysmal atrial tachycardia.   TTE 04/02/2022     ECHOCARDIOGRAM REPORT       Patient Name:   BREONIA MADRIAGA Carapia Date of Exam: 04/02/2022 Medical Rec #:  [id number]        Height:       59.0 in Accession #:    [id number]       Weight:       141.8 lb Date of Birth:  Mar 22, 1946       BSA:          1.594 m Patient Age:    43 years         BP:           118/64 mmHg Patient Gender: F                HR:           78 bpm. Exam Location:  [address]  Procedure: 2D Echo, Cardiac Doppler and Color Doppler  Indications:    R01.1 Murmur   History:        Patient has no prior history of Echocardiogram examinations.                 Signs/Symptoms:Murmur; Risk Factors:Dyslipidemia, Diabetes and                 Former Smoker.   Sonographer:    Coralyn Helling RDCS Referring Phys: [id number] [place]    1. The aortic valve is tricuspid. There is mild calcification of the  aortic valve. There is mild thickening of the aortic valve. Aortic valve regurgitation is not visualized. Mild aortic valve stenosis. Aortic valve mean gradient measures 16.0 mmHg. Aortic valve Vmax measures 2.70 m/s.  2. Left ventricular ejection fraction, by estimation, is 60 to 65%. The left ventricle has normal function. The left ventricle has no  regional wall motion abnormalities. There is mild asymmetric left ventricular hypertrophy of the basal-septal segment. Left ventricular diastolic parameters are consistent with Grade II diastolic dysfunction (pseudonormalization).  3. Right ventricular systolic function is normal. The right ventricular size is normal. There is normal pulmonary artery systolic pressure. The estimated right ventricular systolic pressure is Q000111Q mmHg.  4. Left atrial size was mildly dilated.  5. The mitral valve is degenerative. Mild mitral valve regurgitation. No evidence of mitral stenosis. Moderate mitral annular calcification.  6. The inferior vena cava is normal in size with greater than 50% respiratory variability, suggesting right atrial pressure of 3 mmHg.  FINDINGS  Left Ventricle: Left ventricular ejection fraction, by estimation, is 60 to 65%. The left ventricle has normal function. The left ventricle has no regional wall motion abnormalities. The left ventricular internal cavity size was normal in size. There is  mild asymmetric left ventricular hypertrophy of the basal-septal segment. Left ventricular diastolic parameters are consistent with Grade II diastolic dysfunction (pseudonormalization).  Right Ventricle: The right ventricular size is normal. No increase in right ventricular wall thickness. Right ventricular systolic function is normal. There is normal pulmonary artery systolic pressure. The tricuspid regurgitant velocity is 2.60 m/s, and  with an assumed right atrial pressure of 3 mmHg, the estimated right ventricular systolic pressure is Q000111Q mmHg.  Left Atrium: Left atrial size was mildly dilated.  Right Atrium: Right atrial size was normal in size.  Pericardium: There is no evidence of pericardial effusion.  Mitral Valve: The mitral valve is degenerative in appearance. Moderate mitral annular calcification. Mild mitral valve regurgitation. No evidence of mitral valve  stenosis.  Tricuspid Valve: The tricuspid valve is grossly normal. Tricuspid valve regurgitation is mild . No evidence of tricuspid stenosis.  Aortic Valve: The aortic valve is tricuspid. There is mild calcification of the aortic valve. There is mild thickening of the aortic valve. Aortic valve regurgitation is not visualized. Mild aortic stenosis is present. Aortic valve mean gradient measures  16.0 mmHg. Aortic valve peak gradient measures 29.2 mmHg. Aortic valve area, by VTI measures 1.15 cm.  Pulmonic Valve: The pulmonic valve was grossly normal. Pulmonic valve regurgitation is not visualized. No evidence of pulmonic stenosis.  Aorta: The aortic root and ascending aorta are structurally normal, with no evidence of dilitation.  Venous: The inferior vena cava is normal in size with greater than 50% respiratory variability, suggesting right atrial pressure of 3 mmHg.  IAS/Shunts: The atrial septum is grossly normal.     Recent Labs: No results found for requested labs within last 365 days.  Recent Lipid Panel No results found for: "CHOL", "TRIG", "HDL", "CHOLHDL", "VLDL", "LDLCALC", "LDLDIRECT"  Physical Exam:    VS:  BP 102/60   Pulse 86   Ht 4\' 11"  (1.499 m)   Wt 148 lb (67.1 kg)   LMP  (LMP Unknown)   SpO2 92%   BMI 29.89 kg/m     Wt Readings from Last 3 Encounters:  01/11/23 148 lb (67.1 kg)  09/03/22 146 lb 3.2 oz (66.3 kg)  03/08/22 141 lb 12.8 oz (64.3 kg)     GEN: Well nourished, well developed in no  acute distress HEENT: Normal NECK: No JVD; No carotid bruits LYMPHATICS: No lymphadenopathy CARDIAC: S1S2 noted,RRR, no murmurs, rubs, gallops RESPIRATORY:  Clear to auscultation without rales, wheezing or rhonchi  ABDOMEN: Soft, non-tender, non-distended, +bowel sounds, no guarding. EXTREMITIES: No edema, No cyanosis, no clubbing MUSCULOSKELETAL:  No deformity  SKIN: Warm and dry NEUROLOGIC:  Alert and oriented x 3, non-focal PSYCHIATRIC:  Normal  affect, good insight  ASSESSMENT:    1. Fatigue, unspecified type   2. Medication management   3. Vitamin D deficiency, unspecified   4. Mild aortic stenosis   5. DOE (dyspnea on exertion)   6. PAT (paroxysmal atrial tachycardia)     PLAN:     Her shortness of breath has worsened despite the improvement in her palpitations. She is concern about this. No chest pain. Will get an echo to reassess her mild aortic stenosis.  In addition we will do an ischemic evaluation with a Lexiscan.  We will continue to monitor. Will get blood work today.  The patient is in agreement with the above plan. The patient left the office in stable condition.  The patient will follow up in   Medication Adjustments/Labs and Tests Ordered: Current medicines are reviewed at length with the patient today.  Concerns regarding medicines are outlined above.  Orders Placed This Encounter  Procedures   Comprehensive Metabolic Panel (CMET)   Magnesium   Pro b natriuretic peptide (BNP)   VITAMIN D 25 Hydroxy (Vit-D Deficiency, Fractures)   CBC with Differential/Platelet   MYOCARDIAL PERFUSION IMAGING   ECHOCARDIOGRAM COMPLETE   Meds ordered this encounter  Medications   furosemide (LASIX) 20 MG tablet    Sig: Take 1 tablet (20 mg total) by mouth once a week.    Dispense:  15 tablet    Refill:  3   potassium chloride (KLOR-CON) 10 MEQ tablet    Sig: Take 1 tablet (10 mEq total) by mouth daily.    Dispense:  15 tablet    Refill:  3    Patient Instructions  Medication Instructions:  Your physician has recommended you make the following change in your medication:  START: Lasix 20 mg once weekly START: Potassium 10 mEq once weekly *If you need a refill on your cardiac medications before your next appointment, please call your pharmacy*   Lab Work: Your physician recommends that you have labs drawn today: CMET, Mag, BNP, CBC, Vit D If you have labs (blood work) drawn today and your tests are  completely normal, you will receive your results only by: [place] (if you have MyChart) OR A paper copy in the mail If you have any lab test that is abnormal or we need to change your treatment, we will call you to review the results.   Testing/Procedures: Your physician has requested that you have an echocardiogram. Echocardiography is a painless test that uses sound waves to create images of your heart. It provides your doctor with information about the size and shape of your heart and how well your heart's chambers and valves are working. This procedure takes approximately one hour. There are no restrictions for this procedure. Please do NOT wear cologne, perfume, aftershave, or lotions (deodorant is allowed). Please arrive 15 minutes prior to your appointment time.  Your physician has requested that you have a lexiscan myoview. For further information please visit [email or web address]. Please follow instruction sheet, as given.   The test will take approximately 3 to 4 hours to complete; you may bring reading material.  If someone comes with you to your appointment, they will need to remain in the main lobby due to limited space in the testing area. **If you are pregnant or breastfeeding, please notify the nuclear lab prior to your appointment**  How to prepare for your Myocardial Perfusion Test: Do not eat or drink 3 hours prior to your test, except you may have water. Do not consume products containing caffeine (regular or decaffeinated) 12 hours prior to your test. (ex: coffee, chocolate, sodas, tea). Do bring a list of your current medications with you.  If not listed below, you may take your medications as normal. Do wear comfortable clothes (no dresses or overalls) and walking shoes, tennis shoes preferred (No heels or open toe shoes are allowed). Do NOT wear cologne, perfume, aftershave, or lotions (deodorant is allowed). If these instructions are not followed, your test will  have to be rescheduled.     Follow-Up: At [facility], you and your health needs are our priority.  As part of our continuing mission to provide you with exceptional heart care, we have created designated Provider Care Teams.  These Care Teams include your primary Cardiologist (physician) and Advanced Practice Providers (APPs -  Physician Assistants and Nurse Practitioners) who all work together to provide you with the care you need, when you need it.  We recommend signing up for the patient portal called "MyChart".  Sign up information is provided on this After Visit Summary.  MyChart is used to connect with patients for Virtual Visits (Telemedicine).  Patients are able to view lab/test results, encounter notes, upcoming appointments, etc.  Non-urgent messages can be sent to your provider as well.   To learn more about what you can do with MyChart, go to [email or web address].    Your next appointment:   12 week(s)  Provider:   Berniece Salines, DO     Adopting a Healthy Lifestyle.  Know what a healthy weight is for you (roughly BMI <25) and aim to maintain this   Aim for 7+ servings of fruits and vegetables daily   65-80+ fluid ounces of water or unsweet tea for healthy kidneys   Limit to max 1 drink of alcohol per day; avoid smoking/tobacco   Limit animal fats in diet for cholesterol and heart health - choose grass fed whenever available   Avoid highly processed foods, and foods high in saturated/trans fats   Aim for low stress - take time to unwind and care for your mental health   Aim for 150 min of moderate intensity exercise weekly for heart health, and weights twice weekly for bone health   Aim for 7-9 hours of sleep daily   When it comes to diets, agreement about the perfect plan isnt easy to find, even among the experts. Experts at the [employer] developed an idea known as the Healthy Eating Plate. Just imagine a plate divided into logical,  healthy portions.   The emphasis is on diet quality:   Load up on vegetables and fruits - one-half of your plate: Aim for color and variety, and remember that potatoes dont count.   Go for whole grains - one-quarter of your plate: Whole wheat, barley, wheat berries, quinoa, oats, brown rice, and foods made with them. If you want pasta, go with whole wheat pasta.   Protein power - one-quarter of your plate: Fish, chicken, beans, and nuts are all healthy, versatile protein sources. Limit red meat.   The diet, however, does  go beyond the plate, offering a few other suggestions.   Use healthy plant oils, such as olive, canola, soy, corn, sunflower and peanut. Check the labels, and avoid partially hydrogenated oil, which have unhealthy trans fats.   If youre thirsty, drink water. Coffee and tea are good in moderation, but skip sugary drinks and limit milk and dairy products to one or two daily servings.   The type of carbohydrate in the diet is more important than the amount. Some sources of carbohydrates, such as vegetables, fruits, whole grains, and beans-are healthier than others.   Finally, stay active  Signed, Berniece Salines, DO  01/11/2023 9:05 PM     [facility]

## 2023-01-11 NOTE — Patient Instructions (Signed)
Medication Instructions:  Your physician has recommended you make the following change in your medication:  START: Lasix 20 mg once weekly START: Potassium 10 mEq once weekly *If you need a refill on your cardiac medications before your next appointment, please call your pharmacy*   Lab Work: Your physician recommends that you have labs drawn today: CMET, Mag, BNP, CBC, Vit D If you have labs (blood work) drawn today and your tests are completely normal, you will receive your results only by: [place] (if you have MyChart) OR A paper copy in the mail If you have any lab test that is abnormal or we need to change your treatment, we will call you to review the results.   Testing/Procedures: Your physician has requested that you have an echocardiogram. Echocardiography is a painless test that uses sound waves to create images of your heart. It provides your doctor with information about the size and shape of your heart and how well your heart's chambers and valves are working. This procedure takes approximately one hour. There are no restrictions for this procedure. Please do NOT wear cologne, perfume, aftershave, or lotions (deodorant is allowed). Please arrive 15 minutes prior to your appointment time.  Your physician has requested that you have a lexiscan myoview. For further information please visit [email or web address]. Please follow instruction sheet, as given.   The test will take approximately 3 to 4 hours to complete; you may bring reading material.  If someone comes with you to your appointment, they will need to remain in the main lobby due to limited space in the testing area. **If you are pregnant or breastfeeding, please notify the nuclear lab prior to your appointment**  How to prepare for your Myocardial Perfusion Test: Do not eat or drink 3 hours prior to your test, except you may have water. Do not consume products containing caffeine (regular or decaffeinated) 12  hours prior to your test. (ex: coffee, chocolate, sodas, tea). Do bring a list of your current medications with you.  If not listed below, you may take your medications as normal. Do wear comfortable clothes (no dresses or overalls) and walking shoes, tennis shoes preferred (No heels or open toe shoes are allowed). Do NOT wear cologne, perfume, aftershave, or lotions (deodorant is allowed). If these instructions are not followed, your test will have to be rescheduled.     Follow-Up: At [facility], you and your health needs are our priority.  As part of our continuing mission to provide you with exceptional heart care, we have created designated Provider Care Teams.  These Care Teams include your primary Cardiologist (physician) and Advanced Practice Providers (APPs -  Physician Assistants and Nurse Practitioners) who all work together to provide you with the care you need, when you need it.  We recommend signing up for the patient portal called "MyChart".  Sign up information is provided on this After Visit Summary.  MyChart is used to connect with patients for Virtual Visits (Telemedicine).  Patients are able to view lab/test results, encounter notes, upcoming appointments, etc.  Non-urgent messages can be sent to your provider as well.   To learn more about what you can do with MyChart, go to [email or web address].    Your next appointment:   12 week(s)  Provider:   Berniece Salines, DO

## 2023-01-12 LAB — CBC WITH DIFFERENTIAL/PLATELET
Basophils Absolute: 0.1 10*3/uL (ref 0.0–0.2)
Basos: 1 %
EOS (ABSOLUTE): 0.3 10*3/uL (ref 0.0–0.4)
Eos: 5 %
Hematocrit: 38 % (ref 34.0–46.6)
Hemoglobin: 12.4 g/dL (ref 11.1–15.9)
Immature Grans (Abs): 0 10*3/uL (ref 0.0–0.1)
Immature Granulocytes: 0 %
Lymphocytes Absolute: 1.8 10*3/uL (ref 0.7–3.1)
Lymphs: 28 %
MCH: 30.8 pg (ref 26.6–33.0)
MCHC: 32.6 g/dL (ref 31.5–35.7)
MCV: 94 fL (ref 79–97)
Monocytes Absolute: 0.7 10*3/uL (ref 0.1–0.9)
Monocytes: 11 %
Neutrophils Absolute: 3.5 10*3/uL (ref 1.4–7.0)
Neutrophils: 55 %
Platelets: 187 10*3/uL (ref 150–450)
RBC: 4.03 x10E6/uL (ref 3.77–5.28)
RDW: 13.1 % (ref 11.7–15.4)
WBC: 6.4 10*3/uL (ref 3.4–10.8)

## 2023-01-12 LAB — COMPREHENSIVE METABOLIC PANEL
ALT: 18 IU/L (ref 0–32)
AST: 23 IU/L (ref 0–40)
Albumin/Globulin Ratio: 1.9 (ref 1.2–2.2)
Albumin: 3.8 g/dL (ref 3.8–4.8)
Alkaline Phosphatase: 52 IU/L (ref 44–121)
BUN/Creatinine Ratio: 12 (ref 12–28)
BUN: 14 mg/dL (ref 8–27)
Bilirubin Total: 0.3 mg/dL (ref 0.0–1.2)
CO2: 31 mmol/L — ABNORMAL HIGH (ref 20–29)
Calcium: 9 mg/dL (ref 8.7–10.3)
Chloride: 103 mmol/L (ref 96–106)
Creatinine, Ser: 1.21 mg/dL — ABNORMAL HIGH (ref 0.57–1.00)
Globulin, Total: 2 g/dL (ref 1.5–4.5)
Glucose: 101 mg/dL — ABNORMAL HIGH (ref 70–99)
Potassium: 3.9 mmol/L (ref 3.5–5.2)
Sodium: 148 mmol/L — ABNORMAL HIGH (ref 134–144)
Total Protein: 5.8 g/dL — ABNORMAL LOW (ref 6.0–8.5)
eGFR: 47 mL/min/{1.73_m2} — ABNORMAL LOW (ref >59)

## 2023-01-12 LAB — PRO B NATRIURETIC PEPTIDE: NT-Pro BNP: 393 pg/mL (ref 0–738)

## 2023-01-12 LAB — VITAMIN D 25 HYDROXY (VIT D DEFICIENCY, FRACTURES): Vit D, 25-Hydroxy: 78.4 ng/mL (ref 30.0–100.0)

## 2023-01-12 LAB — MAGNESIUM: Magnesium: 2.4 mg/dL — ABNORMAL HIGH (ref 1.6–2.3)

## 2023-01-21 ENCOUNTER — Other Ambulatory Visit: Payer: Self-pay

## 2023-01-21 DIAGNOSIS — R899 Unspecified abnormal finding in specimens from other organs, systems and tissues: Secondary | ICD-10-CM

## 2023-01-24 ENCOUNTER — Other Ambulatory Visit: Payer: Self-pay

## 2023-01-24 DIAGNOSIS — R002 Palpitations: Secondary | ICD-10-CM | POA: Diagnosis not present

## 2023-01-24 DIAGNOSIS — R899 Unspecified abnormal finding in specimens from other organs, systems and tissues: Secondary | ICD-10-CM

## 2023-01-24 DIAGNOSIS — R5383 Other fatigue: Secondary | ICD-10-CM | POA: Diagnosis not present

## 2023-01-25 LAB — COMPREHENSIVE METABOLIC PANEL
ALT: 19 IU/L (ref 0–32)
AST: 21 IU/L (ref 0–40)
Albumin/Globulin Ratio: 2 (ref 1.2–2.2)
Albumin: 3.8 g/dL (ref 3.8–4.8)
Alkaline Phosphatase: 54 IU/L (ref 44–121)
BUN/Creatinine Ratio: 13 (ref 12–28)
BUN: 14 mg/dL (ref 8–27)
Bilirubin Total: <0.2 mg/dL (ref 0.0–1.2)
CO2: 26 mmol/L (ref 20–29)
Calcium: 8.8 mg/dL (ref 8.7–10.3)
Chloride: 104 mmol/L (ref 96–106)
Creatinine, Ser: 1.1 mg/dL — ABNORMAL HIGH (ref 0.57–1.00)
Globulin, Total: 1.9 g/dL (ref 1.5–4.5)
Glucose: 97 mg/dL (ref 70–99)
Potassium: 4.3 mmol/L (ref 3.5–5.2)
Sodium: 144 mmol/L (ref 134–144)
Total Protein: 5.7 g/dL — ABNORMAL LOW (ref 6.0–8.5)
eGFR: 52 mL/min/{1.73_m2} — ABNORMAL LOW (ref >59)

## 2023-01-25 LAB — MAGNESIUM: Magnesium: 2.3 mg/dL (ref 1.6–2.3)

## 2023-02-12 ENCOUNTER — Ambulatory Visit (HOSPITAL_COMMUNITY): Payer: Medicare Other | Attending: Cardiology

## 2023-02-12 DIAGNOSIS — R0609 Other forms of dyspnea: Secondary | ICD-10-CM | POA: Insufficient documentation

## 2023-02-12 DIAGNOSIS — R5383 Other fatigue: Secondary | ICD-10-CM | POA: Diagnosis not present

## 2023-02-12 LAB — ECHOCARDIOGRAM COMPLETE
AR max vel: 0.96 cm2
AV Area VTI: 0.9 cm2
AV Area mean vel: 0.88 cm2
AV Mean grad: 11 mmHg
AV Peak grad: 19.7 mmHg
Ao pk vel: 2.22 m/s
Area-P 1/2: 2.62 cm2
MV VTI: 0.88 cm2
S' Lateral: 2.6 cm

## 2023-02-13 ENCOUNTER — Telehealth (HOSPITAL_COMMUNITY): Payer: Self-pay | Admitting: *Deleted

## 2023-02-13 NOTE — Telephone Encounter (Signed)
Left message on voicemail per DPR in reference to upcoming appointment scheduled on 02/15/2023 at 10:45 with detailed instructions given per Myocardial Perfusion Study Information Sheet for the test. LM to arrive 15 minutes early, and that it is imperative to arrive on time for appointment to keep from having the test rescheduled. If you need to cancel or reschedule your appointment, please call the office within 24 hours of your appointment. Failure to do so may result in a cancellation of your appointment, and a $50 no show fee. Phone number given for call back for any questions.

## 2023-02-15 ENCOUNTER — Ambulatory Visit (HOSPITAL_COMMUNITY): Payer: Medicare Other | Attending: Cardiology

## 2023-02-15 DIAGNOSIS — I350 Nonrheumatic aortic (valve) stenosis: Secondary | ICD-10-CM | POA: Insufficient documentation

## 2023-02-15 DIAGNOSIS — R5383 Other fatigue: Secondary | ICD-10-CM

## 2023-02-15 LAB — MYOCARDIAL PERFUSION IMAGING
LV dias vol: 42 mL (ref 46–106)
LV sys vol: 18 mL
Nuc Stress EF: 56 %
Peak HR: 73 {beats}/min
Rest HR: 62 {beats}/min
Rest Nuclear Isotope Dose: 10.8 mCi
SDS: 6
SRS: 3
SSS: 9
ST Depression (mm): 0 mm
Stress Nuclear Isotope Dose: 31 mCi
TID: 1.08

## 2023-02-15 MED ORDER — REGADENOSON 0.4 MG/5ML IV SOLN
0.4000 mg | Freq: Once | INTRAVENOUS | Status: AC
Start: 2023-02-15 — End: 2023-02-15
  Administered 2023-02-15: 0.4 mg via INTRAVENOUS

## 2023-02-15 MED ORDER — TECHNETIUM TC 99M TETROFOSMIN IV KIT
31.0000 | PACK | Freq: Once | INTRAVENOUS | Status: AC | PRN
  Administered 2023-02-15: 31 via INTRAVENOUS

## 2023-02-15 MED ORDER — TECHNETIUM TC 99M TETROFOSMIN IV KIT
10.8000 | PACK | Freq: Once | INTRAVENOUS | Status: AC | PRN
  Administered 2023-02-15: 10.8 via INTRAVENOUS

## 2023-03-04 ENCOUNTER — Ambulatory Visit: Payer: Medicare Other | Admitting: Cardiology

## 2023-03-21 DIAGNOSIS — F3110 Bipolar disorder, current episode manic without psychotic features, unspecified: Secondary | ICD-10-CM | POA: Diagnosis not present

## 2023-04-11 DIAGNOSIS — E039 Hypothyroidism, unspecified: Secondary | ICD-10-CM | POA: Diagnosis not present

## 2023-04-11 DIAGNOSIS — M159 Polyosteoarthritis, unspecified: Secondary | ICD-10-CM | POA: Diagnosis not present

## 2023-04-11 DIAGNOSIS — E1169 Type 2 diabetes mellitus with other specified complication: Secondary | ICD-10-CM | POA: Diagnosis not present

## 2023-04-11 DIAGNOSIS — D649 Anemia, unspecified: Secondary | ICD-10-CM | POA: Diagnosis not present

## 2023-04-11 DIAGNOSIS — I700 Atherosclerosis of aorta: Secondary | ICD-10-CM | POA: Diagnosis not present

## 2023-04-11 DIAGNOSIS — K279 Peptic ulcer, site unspecified, unspecified as acute or chronic, without hemorrhage or perforation: Secondary | ICD-10-CM | POA: Diagnosis not present

## 2023-04-11 DIAGNOSIS — M810 Age-related osteoporosis without current pathological fracture: Secondary | ICD-10-CM | POA: Diagnosis not present

## 2023-04-11 DIAGNOSIS — F3181 Bipolar II disorder: Secondary | ICD-10-CM | POA: Diagnosis not present

## 2023-04-11 DIAGNOSIS — R600 Localized edema: Secondary | ICD-10-CM | POA: Diagnosis not present

## 2023-04-11 DIAGNOSIS — Z1239 Encounter for other screening for malignant neoplasm of breast: Secondary | ICD-10-CM | POA: Diagnosis not present

## 2023-04-11 DIAGNOSIS — E785 Hyperlipidemia, unspecified: Secondary | ICD-10-CM | POA: Diagnosis not present

## 2023-04-15 ENCOUNTER — Other Ambulatory Visit: Payer: Self-pay | Admitting: Family Medicine

## 2023-04-15 ENCOUNTER — Ambulatory Visit: Payer: Medicare Other | Admitting: Cardiology

## 2023-04-15 DIAGNOSIS — Z1231 Encounter for screening mammogram for malignant neoplasm of breast: Secondary | ICD-10-CM

## 2023-04-16 ENCOUNTER — Other Ambulatory Visit: Payer: Self-pay | Admitting: Family Medicine

## 2023-04-16 ENCOUNTER — Ambulatory Visit: Payer: Medicare Other | Attending: Cardiology | Admitting: Cardiology

## 2023-04-16 ENCOUNTER — Encounter: Payer: Self-pay | Admitting: Cardiology

## 2023-04-16 VITALS — BP 118/72 | HR 67 | Ht 59.0 in | Wt 141.4 lb

## 2023-04-16 DIAGNOSIS — I350 Nonrheumatic aortic (valve) stenosis: Secondary | ICD-10-CM | POA: Diagnosis not present

## 2023-04-16 DIAGNOSIS — M810 Age-related osteoporosis without current pathological fracture: Secondary | ICD-10-CM

## 2023-04-16 DIAGNOSIS — R011 Cardiac murmur, unspecified: Secondary | ICD-10-CM | POA: Diagnosis not present

## 2023-04-16 DIAGNOSIS — I471 Supraventricular tachycardia, unspecified: Secondary | ICD-10-CM | POA: Diagnosis not present

## 2023-04-16 DIAGNOSIS — E0800 Diabetes mellitus due to underlying condition with hyperosmolarity without nonketotic hyperglycemic-hyperosmolar coma (NKHHC): Secondary | ICD-10-CM | POA: Diagnosis not present

## 2023-04-16 DIAGNOSIS — E782 Mixed hyperlipidemia: Secondary | ICD-10-CM | POA: Diagnosis not present

## 2023-04-16 MED ORDER — METOPROLOL SUCCINATE ER 25 MG PO TB24: 12.5000 mg | ORAL_TABLET | Freq: Every day | ORAL | 3 refills | Status: DC

## 2023-04-16 NOTE — Progress Notes (Signed)
Cardiology Office Note:    Date:  04/19/2023   ID:  Alexandra Morrison, DOB Aug 24, 1946, MRN [id number]  PCP:  Roseanna Rainbow, PA-C (Inactive)  Cardiologist:  Thomasene Ripple, DO  Electrophysiologist:  None   Referring MD: No ref. provider found   "I been feeling much better"  History of Present Illness:    Alexandra Morrison is a 77 y.o. female with a hx of mild aortic stenosis, diabetes mellitus, hyperlipidemia, bipolar disorder and sleep apnea.  Since her last visit she tells me that she is experiencing worsening shortness of breath. The shortness of breath is now really concerning for her.   Since her last visit the zio monitor shower PSVT- toprol xl 12.5 mg was started. She is doing well on the toprol xl.  Past Medical History:  Diagnosis Date   Abdominal aortic atherosclerosis (HCC)    Moderate   Anxiety    Arthritis    Bipolar disorder (HCC)    type 2 hypomanic   Complication of anesthesia    pt request no spinal anesthesia only general   DDD (degenerative disc disease), lumbar    High cholesterol    History of diabetes mellitus    resolved after weight loss   History of mumps as a child    History of sleep apnea    most recent study was negative for sleep apnea, resolved after weight loss    Hypothyroidism    Myelopathy (HCC)    Numbness and tingling in hands    Osteopenia    Osteoporosis    Right arm fracture 1953   x2   Scoliosis    per pt   Spinal stenosis    Moderate to severe   Vertigo    Wears glasses     Past Surgical History:  Procedure Laterality Date   APPENDECTOMY     CARPAL TUNNEL RELEASE Bilateral    CESAREAN SECTION  1971, 1973   x2   COLONOSCOPY WITH PROPOFOL N/A 09/14/2019   Procedure: COLONOSCOPY WITH PROPOFOL;  Surgeon: Charlott Rakes, MD;  Location: [facility] ENDOSCOPY;  Service: Endoscopy;  Laterality: N/A;   ESOPHAGOGASTRODUODENOSCOPY (EGD) WITH PROPOFOL N/A 09/14/2019   Procedure: ESOPHAGOGASTRODUODENOSCOPY (EGD) WITH PROPOFOL;   Surgeon: Charlott Rakes, MD;  Location: [facility] ENDOSCOPY;  Service: Endoscopy;  Laterality: N/A;   EYE SURGERY Bilateral 2006   cataracts   FRACTURE SURGERY     Arm   GASTRIC BYPASS  2006   POSTERIOR CERVICAL FUSION/FORAMINOTOMY N/A 11/25/2015   Procedure: Posterior Cervical Fusion with lateral mass fixation Cervical fiv-Thoracic one, cervical laminectomy  - Cervical six-Cervical seven - Cervical seven-Thoracic one;  Surgeon: Tia Alert, MD;  Location: [facility] NEURO ORS;  Service: Neurosurgery;  Laterality: N/A;   TOTAL KNEE ARTHROPLASTY Right 07/11/2017   Procedure: RIGHT TOTAL KNEE ARTHROPLASTY;  Surgeon: Jene Every, MD;  Location: [facility] ORS;  Service: Orthopedics;  Laterality: Right;  150 mins   TOTAL KNEE ARTHROPLASTY Left 03/12/2018   Procedure: LEFT TOTAL KNEE ARTHROPLASTY;  Surgeon: Jene Every, MD;  Location: [facility] ORS;  Service: Orthopedics;  Laterality: Left;  Adductor Block   TOTAL SHOULDER ARTHROPLASTY Bilateral 2010, 2011    Current Medications: Current Meds  Medication Sig   acetaminophen (TYLENOL) 500 MG tablet Take 1,000 mg by mouth See admin instructions. Take two tablets (1000 mg) by mouth every morning, may also take two tablets (1000 mg) every 6 hours as needed for pain   ALPRAZolam (XANAX) 0.5 MG tablet Take 0.5 mg by mouth 3 (  three) times daily as needed for anxiety.    amphetamine-dextroamphetamine (ADDERALL) 30 MG tablet Take 30 mg by mouth 2 (two) times daily with breakfast and lunch.    buPROPion (WELLBUTRIN XL) 150 MG 24 hr tablet Take 150 mg by mouth daily with breakfast. Take with a 300 mg tablet for a total dose of 450 mg   buPROPion (WELLBUTRIN XL) 300 MG 24 hr tablet Take 300 mg by mouth daily with breakfast. Take with a 150 mg tablet for a total dose of 450 mg   calcium carbonate (OS-CAL) 600 MG tablet Take 600 mg by mouth 2 (two) times daily.   divalproex (DEPAKOTE ER) 500 MG 24 hr tablet Take 1,000 mg by mouth daily with breakfast.    ferrous sulfate 325 (65 FE)  MG tablet Take 325 mg by mouth at bedtime.   furosemide (LASIX) 20 MG tablet Take 1 tablet (20 mg total) by mouth once a week.   levothyroxine (SYNTHROID, LEVOTHROID) 50 MCG tablet Take 50 mcg by mouth daily with breakfast.    lovastatin (MEVACOR) 10 MG tablet Take 10 mg by mouth daily with supper.   Magnesium 500 MG TABS Take 500 mg by mouth daily after supper.   methocarbamol (ROBAXIN) 500 MG tablet Take 1 tablet (500 mg total) by mouth every 6 (six) hours as needed for muscle spasms. (Patient taking differently: Take 500 mg by mouth 2 (two) times daily as needed for muscle spasms.)   Multiple Vitamins-Minerals (MULTIVITAMIN WITH MINERALS) tablet Take 1 tablet by mouth daily with lunch.    potassium chloride (KLOR-CON) 10 MEQ tablet Take 1 tablet (10 mEq total) by mouth daily.   traZODone (DESYREL) 100 MG tablet Take 100 mg by mouth at bedtime as needed for sleep.    [DISCONTINUED] metoprolol succinate (TOPROL XL) 25 MG 24 hr tablet Take 0.5 tablets (12.5 mg total) by mouth daily.     Allergies:   Propofol   Social History   Socioeconomic History   Marital status: Married    Spouse name: Not on file   Number of children: Not on file   Years of education: Not on file   Highest education level: Not on file  Occupational History   Not on file  Tobacco Use   Smoking status: Former    Types: Cigarettes    Quit date: 01/20/1993    Years since quitting: 30.2   Smokeless tobacco: Never  Vaping Use   Vaping Use: Never used  Substance and Sexual Activity   Alcohol use: Yes    Comment: rarely   Drug use: No   Sexual activity: Yes    Birth control/protection: Post-menopausal  Other Topics Concern   Not on file  Social History Narrative   Not on file   Social Determinants of Health   Financial Resource Strain: Not on file  Food Insecurity: Not on file  Transportation Needs: Not on file  Physical Activity: Not on file  Stress: Not on file  Social Connections: Not on file      Family History: The patient's family history includes Breast cancer in her maternal aunt.  ROS:   Review of Systems  Constitution: Negative for decreased appetite, fever and weight gain.  HENT: Negative for congestion, ear discharge, hoarse voice and sore throat.   Eyes: Negative for discharge, redness, vision loss in right eye and visual halos.  Cardiovascular: Report shortness of breath. Negative for chest pain, leg swelling, orthopnea and palpitations.  Respiratory: Negative for cough, hemoptysis, shortness  of breath and snoring.   Endocrine: Negative for heat intolerance and polyphagia.  Hematologic/Lymphatic: Negative for bleeding problem. Does not bruise/bleed easily.  Skin: Negative for flushing, nail changes, rash and suspicious lesions.  Musculoskeletal: Negative for arthritis, joint pain, muscle cramps, myalgias, neck pain and stiffness.  Gastrointestinal: Negative for abdominal pain, bowel incontinence, diarrhea and excessive appetite.  Genitourinary: Negative for decreased libido, genital sores and incomplete emptying.  Neurological: Negative for brief paralysis, focal weakness, headaches and loss of balance.  Psychiatric/Behavioral: Negative for altered mental status, depression and suicidal ideas.  Allergic/Immunologic: Negative for HIV exposure and persistent infections.   EKGs/Labs/Other Studies Reviewed:    The following studies were reviewed today:   EKG:  Normal sinus rhythm, HR 67 bpm   Zio monitor 01/11/2023 Patch Wear Time:  13 days and 22 hours (2023-11-16T10:28:08-499 to 2023-11-30T09:14:58-0500)   Patient had a min HR of 57 bpm, max HR of 200 bpm, and avg HR of 86 bpm.    Predominant underlying rhythm was Sinus Rhythm.    16 Supraventricular Tachycardia runs occurred, the run with the fastest interval lasting 6 beats with a max rate of 200 bpm, the longest lasting 15.4 secs with an avg rate of 104 bpm.  Some episodes of Supraventricular Tachycardia may  be possible Atrial Tachycardia with variable block. Isolated SVEs were rare (<1.0%), SVE Couplets were rare (<1.0%), and SVE Triplets were rare (<1.0%). Isolated VEs were rare (<1.0%), VE Couplets were rare (<1.0%), and no VE Triplets were present.   Symptoms associated with Sinus Rhythm.   Conclusion: The study shows evidence of paroxysmal atrial tachycardia.   TTE 04/02/2022     ECHOCARDIOGRAM REPORT       Patient Name:   Alexandra Morrison Date of Exam: 04/02/2022 Medical Rec #:  [id number]        Height:       59.0 in Accession #:    [id number]       Weight:       141.8 lb Date of Birth:  1946-10-07       BSA:          1.594 m Patient Age:    75 years         BP:           118/64 mmHg Patient Gender: F                HR:           78 bpm. Exam Location:  [address]  Procedure: 2D Echo, Cardiac Doppler and Color Doppler  Indications:    R01.1 Murmur   History:        Patient has no prior history of Echocardiogram examinations.                 Signs/Symptoms:Murmur; Risk Factors:Dyslipidemia, Diabetes and                 Former Smoker.   Sonographer:    Samule Ohm RDCS Referring Phys: [id number] Leno Mathes  IMPRESSIONS    1. The aortic valve is tricuspid. There is mild calcification of the aortic valve. There is mild thickening of the aortic valve. Aortic valve regurgitation is not visualized. Mild aortic valve stenosis. Aortic valve mean gradient measures 16.0 mmHg. Aortic valve Vmax measures 2.70 m/s.  2. Left ventricular ejection fraction, by estimation, is 60 to 65%. The left ventricle has normal function. The left ventricle has no regional wall motion abnormalities. There is mild asymmetric  left ventricular hypertrophy of the basal-septal segment. Left ventricular diastolic parameters are consistent with Grade II diastolic dysfunction (pseudonormalization).  3. Right ventricular systolic function is normal. The right ventricular size is normal. There is  normal pulmonary artery systolic pressure. The estimated right ventricular systolic pressure is 30.0 mmHg.  4. Left atrial size was mildly dilated.  5. The mitral valve is degenerative. Mild mitral valve regurgitation. No evidence of mitral stenosis. Moderate mitral annular calcification.  6. The inferior vena cava is normal in size with greater than 50% respiratory variability, suggesting right atrial pressure of 3 mmHg.  FINDINGS  Left Ventricle: Left ventricular ejection fraction, by estimation, is 60 to 65%. The left ventricle has normal function. The left ventricle has no regional wall motion abnormalities. The left ventricular internal cavity size was normal in size. There is  mild asymmetric left ventricular hypertrophy of the basal-septal segment. Left ventricular diastolic parameters are consistent with Grade II diastolic dysfunction (pseudonormalization).  Right Ventricle: The right ventricular size is normal. No increase in right ventricular wall thickness. Right ventricular systolic function is normal. There is normal pulmonary artery systolic pressure. The tricuspid regurgitant velocity is 2.60 m/s, and  with an assumed right atrial pressure of 3 mmHg, the estimated right ventricular systolic pressure is 30.0 mmHg.  Left Atrium: Left atrial size was mildly dilated.  Right Atrium: Right atrial size was normal in size.  Pericardium: There is no evidence of pericardial effusion.  Mitral Valve: The mitral valve is degenerative in appearance. Moderate mitral annular calcification. Mild mitral valve regurgitation. No evidence of mitral valve stenosis.  Tricuspid Valve: The tricuspid valve is grossly normal. Tricuspid valve regurgitation is mild . No evidence of tricuspid stenosis.  Aortic Valve: The aortic valve is tricuspid. There is mild calcification of the aortic valve. There is mild thickening of the aortic valve. Aortic valve regurgitation is not visualized. Mild  aortic stenosis is present. Aortic valve mean gradient measures  16.0 mmHg. Aortic valve peak gradient measures 29.2 mmHg. Aortic valve area, by VTI measures 1.15 cm.  Pulmonic Valve: The pulmonic valve was grossly normal. Pulmonic valve regurgitation is not visualized. No evidence of pulmonic stenosis.  Aorta: The aortic root and ascending aorta are structurally normal, with no evidence of dilitation.  Venous: The inferior vena cava is normal in size with greater than 50% respiratory variability, suggesting right atrial pressure of 3 mmHg.  IAS/Shunts: The atrial septum is grossly normal.     Recent Labs: 01/11/2023: Hemoglobin 12.4; NT-Pro BNP 393; Platelets 187 01/24/2023: ALT 19; BUN 14; Creatinine, Ser 1.10; Magnesium 2.3; Potassium 4.3; Sodium 144  Recent Lipid Panel No results found for: "CHOL", "TRIG", "HDL", "CHOLHDL", "VLDL", "LDLCALC", "LDLDIRECT"  Physical Exam:    VS:  BP 118/72   Pulse 67   Ht 4\' 11"  (1.499 m)   Wt 141 lb 6.4 oz (64.1 kg)   LMP  (LMP Unknown)   SpO2 95%   BMI 28.56 kg/m     Wt Readings from Last 3 Encounters:  04/16/23 141 lb 6.4 oz (64.1 kg)  02/15/23 148 lb (67.1 kg)  01/11/23 148 lb (67.1 kg)     GEN: Well nourished, well developed in no acute distress HEENT: Normal NECK: No JVD; No carotid bruits LYMPHATICS: No lymphadenopathy CARDIAC: S1S2 noted,RRR, no murmurs, rubs, gallops RESPIRATORY:  Clear to auscultation without rales, wheezing or rhonchi  ABDOMEN: Soft, non-tender, non-distended, +bowel sounds, no guarding. EXTREMITIES: No edema, No cyanosis, no clubbing MUSCULOSKELETAL:  No deformity  SKIN:  Warm and dry NEUROLOGIC:  Alert and oriented x 3, non-focal PSYCHIATRIC:  Normal affect, good insight  ASSESSMENT:    1. PSVT (paroxysmal supraventricular tachycardia)   2. PSVT   3. Mixed hyperlipidemia   4. Mild aortic stenosis   5. Diabetes mellitus due to underlying condition with hyperosmolarity without coma, without  long-term current use of insulin (HCC)     PLAN:    She has had improvement on the toprol - but will transition to taking this at night time.  Continue her lovastatin Mild AS - no symptoms will continue to monitor.   The patient is in agreement with the above plan. The patient left the office in stable condition.  The patient will follow up in 9 months   Medication Adjustments/Labs and Tests Ordered: Current medicines are reviewed at length with the patient today.  Concerns regarding medicines are outlined above.  Orders Placed This Encounter  Procedures   EKG 12-Lead   Meds ordered this encounter  Medications   metoprolol succinate (TOPROL XL) 25 MG 24 hr tablet    Sig: Take 0.5 tablets (12.5 mg total) by mouth daily with supper.    Dispense:  45 tablet    Refill:  3    Cancel toprol 25 mg daily    Patient Instructions  Medication Instructions:  Your physician recommends that you continue on your current medications as directed. Please refer to the Current Medication list given to you today.  *If you need a refill on your cardiac medications before your next appointment, please call your pharmacy*   Lab Work: None   Testing/Procedures: None   Follow-Up: At [facility], you and your health needs are our priority.  As part of our continuing mission to provide you with exceptional heart care, we have created designated Provider Care Teams.  These Care Teams include your primary Cardiologist (physician) and Advanced Practice Providers (APPs -  Physician Assistants and Nurse Practitioners) who all work together to provide you with the care you need, when you need it.   Your next appointment:   9 month(s)  Provider:   Thomasene Ripple, DO    Adopting a Healthy Lifestyle.  Know what a healthy weight is for you (roughly BMI <25) and aim to maintain this   Aim for 7+ servings of fruits and vegetables daily   65-80+ fluid ounces of water or unsweet tea for  healthy kidneys   Limit to max 1 drink of alcohol per day; avoid smoking/tobacco   Limit animal fats in diet for cholesterol and heart health - choose grass fed whenever available   Avoid highly processed foods, and foods high in saturated/trans fats   Aim for low stress - take time to unwind and care for your mental health   Aim for 150 min of moderate intensity exercise weekly for heart health, and weights twice weekly for bone health   Aim for 7-9 hours of sleep daily   When it comes to diets, agreement about the perfect plan isnt easy to find, even among the experts. Experts at the [facility] of [employer] developed an idea known as the Healthy Eating Plate. Just imagine a plate divided into logical, healthy portions.   The emphasis is on diet quality:   Load up on vegetables and fruits - one-half of your plate: Aim for color and variety, and remember that potatoes dont count.   Go for whole grains - one-quarter of your plate: Whole wheat, barley, wheat berries,  quinoa, oats, brown rice, and foods made with them. If you want pasta, go with whole wheat pasta.   Protein power - one-quarter of your plate: Fish, chicken, beans, and nuts are all healthy, versatile protein sources. Limit red meat.   The diet, however, does go beyond the plate, offering a few other suggestions.   Use healthy plant oils, such as olive, canola, soy, corn, sunflower and peanut. Check the labels, and avoid partially hydrogenated oil, which have unhealthy trans fats.   If youre thirsty, drink water. Coffee and tea are good in moderation, but skip sugary drinks and limit milk and dairy products to one or two daily servings.   The type of carbohydrate in the diet is more important than the amount. Some sources of carbohydrates, such as vegetables, fruits, whole grains, and beans-are healthier than others.   Finally, stay active  Signed, Thomasene Ripple, DO  04/19/2023 7:36 PM    [facility]

## 2023-04-16 NOTE — Patient Instructions (Signed)
Medication Instructions:  Your physician recommends that you continue on your current medications as directed. Please refer to the Current Medication list given to you today.  *If you need a refill on your cardiac medications before your next appointment, please call your pharmacy*   Lab Work: None   Testing/Procedures: None   Follow-Up: At [facility], you and your health needs are our priority.  As part of our continuing mission to provide you with exceptional heart care, we have created designated Provider Care Teams.  These Care Teams include your primary Cardiologist (physician) and Advanced Practice Providers (APPs -  Physician Assistants and Nurse Practitioners) who all work together to provide you with the care you need, when you need it.   Your next appointment:   9 month(s)  Provider:   Kardie Tobb, DO   

## 2023-06-13 DIAGNOSIS — F3110 Bipolar disorder, current episode manic without psychotic features, unspecified: Secondary | ICD-10-CM | POA: Diagnosis not present

## 2023-07-19 DIAGNOSIS — Z23 Encounter for immunization: Secondary | ICD-10-CM | POA: Diagnosis not present

## 2023-09-13 ENCOUNTER — Ambulatory Visit
Admission: RE | Admit: 2023-09-13 | Discharge: 2023-09-13 | Disposition: A | Discharge status: Home / Self Care | Payer: Medicare Other | Source: Ambulatory Visit | Attending: Family Medicine | Admitting: Family Medicine

## 2023-09-13 DIAGNOSIS — Z1231 Encounter for screening mammogram for malignant neoplasm of breast: Secondary | ICD-10-CM

## 2023-09-13 DIAGNOSIS — E349 Endocrine disorder, unspecified: Secondary | ICD-10-CM | POA: Diagnosis not present

## 2023-09-13 DIAGNOSIS — M8588 Other specified disorders of bone density and structure, other site: Secondary | ICD-10-CM | POA: Diagnosis not present

## 2023-09-13 DIAGNOSIS — N958 Other specified menopausal and perimenopausal disorders: Secondary | ICD-10-CM | POA: Diagnosis not present

## 2023-09-13 DIAGNOSIS — M810 Age-related osteoporosis without current pathological fracture: Secondary | ICD-10-CM

## 2023-09-16 DIAGNOSIS — F3110 Bipolar disorder, current episode manic without psychotic features, unspecified: Secondary | ICD-10-CM | POA: Diagnosis not present

## 2023-09-25 DIAGNOSIS — M810 Age-related osteoporosis without current pathological fracture: Secondary | ICD-10-CM | POA: Diagnosis not present

## 2023-09-25 DIAGNOSIS — K279 Peptic ulcer, site unspecified, unspecified as acute or chronic, without hemorrhage or perforation: Secondary | ICD-10-CM | POA: Diagnosis not present

## 2023-09-25 DIAGNOSIS — E039 Hypothyroidism, unspecified: Secondary | ICD-10-CM | POA: Diagnosis not present

## 2023-09-25 DIAGNOSIS — Z0000 Encounter for general adult medical examination without abnormal findings: Secondary | ICD-10-CM | POA: Diagnosis not present

## 2023-09-25 DIAGNOSIS — I700 Atherosclerosis of aorta: Secondary | ICD-10-CM | POA: Diagnosis not present

## 2023-09-25 DIAGNOSIS — R600 Localized edema: Secondary | ICD-10-CM | POA: Diagnosis not present

## 2023-09-25 DIAGNOSIS — E1121 Type 2 diabetes mellitus with diabetic nephropathy: Secondary | ICD-10-CM | POA: Diagnosis not present

## 2023-09-25 DIAGNOSIS — E1169 Type 2 diabetes mellitus with other specified complication: Secondary | ICD-10-CM | POA: Diagnosis not present

## 2023-09-25 DIAGNOSIS — F3181 Bipolar II disorder: Secondary | ICD-10-CM | POA: Diagnosis not present

## 2023-09-25 DIAGNOSIS — E785 Hyperlipidemia, unspecified: Secondary | ICD-10-CM | POA: Diagnosis not present

## 2023-09-25 DIAGNOSIS — D649 Anemia, unspecified: Secondary | ICD-10-CM | POA: Diagnosis not present

## 2023-10-08 DIAGNOSIS — Z961 Presence of intraocular lens: Secondary | ICD-10-CM | POA: Diagnosis not present

## 2023-10-08 DIAGNOSIS — H532 Diplopia: Secondary | ICD-10-CM | POA: Diagnosis not present

## 2023-10-08 DIAGNOSIS — H16143 Punctate keratitis, bilateral: Secondary | ICD-10-CM | POA: Diagnosis not present

## 2023-10-08 DIAGNOSIS — H5051 Esophoria: Secondary | ICD-10-CM | POA: Diagnosis not present

## 2023-10-08 DIAGNOSIS — H524 Presbyopia: Secondary | ICD-10-CM | POA: Diagnosis not present

## 2023-12-10 DIAGNOSIS — F3110 Bipolar disorder, current episode manic without psychotic features, unspecified: Secondary | ICD-10-CM | POA: Diagnosis not present

## 2024-01-13 DIAGNOSIS — G4733 Obstructive sleep apnea (adult) (pediatric): Secondary | ICD-10-CM | POA: Diagnosis not present

## 2024-01-28 ENCOUNTER — Emergency Department (HOSPITAL_BASED_OUTPATIENT_CLINIC_OR_DEPARTMENT_OTHER)

## 2024-01-28 ENCOUNTER — Encounter (HOSPITAL_BASED_OUTPATIENT_CLINIC_OR_DEPARTMENT_OTHER): Payer: Self-pay | Admitting: Emergency Medicine

## 2024-01-28 ENCOUNTER — Other Ambulatory Visit: Payer: Self-pay

## 2024-01-28 ENCOUNTER — Emergency Department (HOSPITAL_BASED_OUTPATIENT_CLINIC_OR_DEPARTMENT_OTHER)
Admission: EM | Admit: 2024-01-28 | Discharge: 2024-01-28 | Disposition: A | Discharge status: Home / Self Care | Attending: Emergency Medicine | Admitting: Emergency Medicine

## 2024-01-28 DIAGNOSIS — E039 Hypothyroidism, unspecified: Secondary | ICD-10-CM | POA: Insufficient documentation

## 2024-01-28 DIAGNOSIS — E119 Type 2 diabetes mellitus without complications: Secondary | ICD-10-CM | POA: Diagnosis not present

## 2024-01-28 DIAGNOSIS — Z79899 Other long term (current) drug therapy: Secondary | ICD-10-CM | POA: Insufficient documentation

## 2024-01-28 DIAGNOSIS — R42 Dizziness and giddiness: Secondary | ICD-10-CM | POA: Insufficient documentation

## 2024-01-28 DIAGNOSIS — E876 Hypokalemia: Secondary | ICD-10-CM | POA: Diagnosis not present

## 2024-01-28 DIAGNOSIS — K5100 Ulcerative (chronic) pancolitis without complications: Secondary | ICD-10-CM | POA: Insufficient documentation

## 2024-01-28 DIAGNOSIS — R918 Other nonspecific abnormal finding of lung field: Secondary | ICD-10-CM | POA: Diagnosis not present

## 2024-01-28 DIAGNOSIS — Z96611 Presence of right artificial shoulder joint: Secondary | ICD-10-CM | POA: Diagnosis not present

## 2024-01-28 DIAGNOSIS — R197 Diarrhea, unspecified: Secondary | ICD-10-CM | POA: Diagnosis not present

## 2024-01-28 DIAGNOSIS — R0602 Shortness of breath: Secondary | ICD-10-CM | POA: Insufficient documentation

## 2024-01-28 DIAGNOSIS — Z96612 Presence of left artificial shoulder joint: Secondary | ICD-10-CM | POA: Diagnosis not present

## 2024-01-28 DIAGNOSIS — R5383 Other fatigue: Secondary | ICD-10-CM | POA: Diagnosis not present

## 2024-01-28 DIAGNOSIS — R0989 Other specified symptoms and signs involving the circulatory and respiratory systems: Secondary | ICD-10-CM | POA: Diagnosis not present

## 2024-01-28 LAB — COMPREHENSIVE METABOLIC PANEL WITH GFR
ALT: 14 U/L (ref 0–44)
AST: 22 U/L (ref 15–41)
Albumin: 2.4 g/dL — ABNORMAL LOW (ref 3.5–5.0)
Alkaline Phosphatase: 61 U/L (ref 38–126)
Anion gap: 12 (ref 5–15)
BUN: 17 mg/dL (ref 8–23)
CO2: 25 mmol/L (ref 22–32)
Calcium: 8.3 mg/dL — ABNORMAL LOW (ref 8.9–10.3)
Chloride: 98 mmol/L (ref 98–111)
Creatinine, Ser: 1.38 mg/dL — ABNORMAL HIGH (ref 0.44–1.00)
GFR, Estimated: 40 mL/min — ABNORMAL LOW (ref >60)
Glucose, Bld: 128 mg/dL — ABNORMAL HIGH (ref 70–99)
Potassium: 2.8 mmol/L — ABNORMAL LOW (ref 3.5–5.1)
Sodium: 135 mmol/L (ref 135–145)
Total Bilirubin: 0.4 mg/dL (ref 0.0–1.2)
Total Protein: 6.3 g/dL — ABNORMAL LOW (ref 6.5–8.1)

## 2024-01-28 LAB — MAGNESIUM: Magnesium: 1.8 mg/dL (ref 1.7–2.4)

## 2024-01-28 LAB — CBC
HCT: 39.9 % (ref 36.0–46.0)
Hemoglobin: 13.2 g/dL (ref 12.0–15.0)
MCH: 30.9 pg (ref 26.0–34.0)
MCHC: 33.1 g/dL (ref 30.0–36.0)
MCV: 93.4 fL (ref 80.0–100.0)
Platelets: 357 10*3/uL (ref 150–400)
RBC: 4.27 MIL/uL (ref 3.87–5.11)
RDW: 13.2 % (ref 11.5–15.5)
WBC: 14.5 10*3/uL — ABNORMAL HIGH (ref 4.0–10.5)
nRBC: 0 % (ref 0.0–0.2)

## 2024-01-28 LAB — URINALYSIS, ROUTINE W REFLEX MICROSCOPIC
Bilirubin Urine: NEGATIVE
Glucose, UA: NEGATIVE mg/dL
Hgb urine dipstick: NEGATIVE
Ketones, ur: NEGATIVE mg/dL
Leukocytes,Ua: NEGATIVE
Nitrite: NEGATIVE
Protein, ur: NEGATIVE mg/dL
Specific Gravity, Urine: <=1.005 (ref 1.005–1.030)
pH: 5.5 (ref 5.0–8.0)

## 2024-01-28 LAB — TROPONIN I (HIGH SENSITIVITY): Troponin I (High Sensitivity): 12 ng/L (ref <18)

## 2024-01-28 LAB — LIPASE, BLOOD: Lipase: 26 U/L (ref 11–51)

## 2024-01-28 MED ORDER — POTASSIUM CHLORIDE CRYS ER 20 MEQ PO TBCR
40.0000 meq | EXTENDED_RELEASE_TABLET | Freq: Once | ORAL | Status: AC
  Administered 2024-01-28: 40 meq via ORAL
  Filled 2024-01-28: qty 2

## 2024-01-28 MED ORDER — IOHEXOL 300 MG/ML  SOLN
75.0000 mL | Freq: Once | INTRAMUSCULAR | Status: AC | PRN
  Administered 2024-01-28: 75 mL via INTRAVENOUS

## 2024-01-28 MED ORDER — POTASSIUM CHLORIDE 10 MEQ/100ML IV SOLN
10.0000 meq | Freq: Once | INTRAVENOUS | Status: AC
  Administered 2024-01-28: 10 meq via INTRAVENOUS
  Filled 2024-01-28: qty 100

## 2024-01-28 MED ORDER — POTASSIUM CHLORIDE CRYS ER 20 MEQ PO TBCR: 20.0000 meq | EXTENDED_RELEASE_TABLET | Freq: Two times a day (BID) | ORAL | 0 refills | Status: DC

## 2024-01-28 MED ORDER — SODIUM CHLORIDE 0.9 % IV BOLUS
1000.0000 mL | Freq: Once | INTRAVENOUS | Status: AC
  Administered 2024-01-28: 1000 mL via INTRAVENOUS

## 2024-01-28 MED ORDER — AMOXICILLIN-POT CLAVULANATE 875-125 MG PO TABS
1.0000 | ORAL_TABLET | Freq: Once | ORAL | Status: AC
  Administered 2024-01-28: 1 via ORAL
  Filled 2024-01-28: qty 1

## 2024-01-28 MED ORDER — AMOXICILLIN-POT CLAVULANATE 875-125 MG PO TABS: 1.0000 | ORAL_TABLET | Freq: Two times a day (BID) | ORAL | 0 refills | Status: DC

## 2024-01-28 NOTE — ED Triage Notes (Addendum)
 Had diarrhea started 2 weeks ago and now she is constipated , took a laxative  a week ago and that helped now feels drained and weak states just ca me from dr  he checked for blood in stool states none there as she said stools have been dark

## 2024-01-28 NOTE — ED Provider Notes (Signed)
 [place] [facility] AT [facility] Provider Note   CSN: [id number] Arrival date & time: 01/28/24  4098     History  Chief Complaint  Patient presents with   Diarrhea    Alexandra Morrison is a 78 y.o. female.  Patient with h/o type 2 diabetes, s/p gastric bypass, osteoporosis, bipolar hypomanic type II, anxiety, allergic rhinitis, chronic leg edema, hyperlipidemia, hypothyroidism, h/o PUD --presents to the emergency department today for evaluation of dizziness and fatigue.  Patient reports being in her normal state of health up until 2 weeks ago.  She developed approximately 4 days of diarrhea.  This was nonbloody but was dark.  She then has been having more constipation symptoms since that time.  She did have a bowel movement this morning that was normal.  At times her stool has been very black.  Denies iron or Pepto-Bismol use.  Denies recent travel.  Her fatigue is profound and she just wants to sleep all the time.  She went to PCP today and was referred to the emergency department for further evaluation.  There she had rectal exam (documented in note as brown stool, Hemoccult negative).  She was encouraged to come to the ED for more acute workup and imaging.  Patient denies fever, chest pain or cough.  She does get shortness of breath with activity but feels that this is at baseline.  Her legs are less swollen than typical.  Denies any urinary symptoms.  Dizziness is described as a lightheadedness and a spinning sensation.       Home Medications Prior to Admission medications   Medication Sig Start Date End Date Taking? Authorizing Provider  acetaminophen (TYLENOL) 500 MG tablet Take 1,000 mg by mouth See admin instructions. Take two tablets (1000 mg) by mouth every morning, may also take two tablets (1000 mg) every 6 hours as needed for pain    [provider]  ALPRAZolam (XANAX) 0.5 MG tablet Take 0.5 mg by mouth 3 (three) times daily as needed for  anxiety.     [provider]  amphetamine-dextroamphetamine (ADDERALL) 30 MG tablet Take 30 mg by mouth 2 (two) times daily with breakfast and lunch.     [provider]  buPROPion (WELLBUTRIN XL) 150 MG 24 hr tablet Take 150 mg by mouth daily with breakfast. Take with a 300 mg tablet for a total dose of 450 mg 08/04/19   [provider]  buPROPion (WELLBUTRIN XL) 300 MG 24 hr tablet Take 300 mg by mouth daily with breakfast. Take with a 150 mg tablet for a total dose of 450 mg 12/30/17   [provider]  calcium carbonate (OS-CAL) 600 MG tablet Take 600 mg by mouth 2 (two) times daily.    [provider]  divalproex (DEPAKOTE ER) 500 MG 24 hr tablet Take 1,000 mg by mouth daily with breakfast.  03/28/17   [provider]  ferrous sulfate 325 (65 FE) MG tablet Take 325 mg by mouth at bedtime.    [provider]  furosemide (LASIX) 20 MG tablet Take 1 tablet (20 mg total) by mouth once a week. 01/11/23   Tobb, Kardie, DO  levothyroxine (SYNTHROID, LEVOTHROID) 50 MCG tablet Take 50 mcg by mouth daily with breakfast.     [provider]  lovastatin (MEVACOR) 10 MG tablet Take 10 mg by mouth daily with supper. 07/30/19   [provider]  Magnesium 500 MG TABS Take 500 mg by mouth daily after supper.  [provider]  methocarbamol (ROBAXIN) 500 MG tablet Take 1 tablet (500 mg total) by mouth every 6 (six) hours as needed for muscle spasms. Patient taking differently: Take 500 mg by mouth 2 (two) times daily as needed for muscle spasms. 03/14/18   Bissell, Jaclyn M, PA-C  metoprolol succinate (TOPROL XL) 25 MG 24 hr tablet Take 0.5 tablets (12.5 mg total) by mouth daily with supper. 04/16/23   Tobb, Kardie, DO  Multiple Vitamins-Minerals (MULTIVITAMIN WITH MINERALS) tablet Take 1 tablet by mouth daily with lunch.     [provider]  pantoprazole (PROTONIX) 40 MG tablet Take 1 tablet (40 mg total) by mouth 2 (two)  times daily. 09/15/19 09/03/22  Samtani, Jai-Gurmukh, MD  potassium chloride (KLOR-CON) 10 MEQ tablet Take 1 tablet (10 mEq total) by mouth daily. 01/11/23   Tobb, Kardie, DO  traZODone (DESYREL) 100 MG tablet Take 100 mg by mouth at bedtime as needed for sleep.  07/09/19   [provider]      Allergies    Propofol    Review of Systems   Review of Systems  Physical Exam Updated Vital Signs BP (!) 111/51 (BP Location: Right Arm)   Pulse 77   Temp 97.6 F (36.4 C) (Oral)   Resp 20   Ht 4\' 11"  (1.499 m)   Wt 64 kg   LMP  (LMP Unknown)   SpO2 96%   BMI 28.50 kg/m   Physical Exam Vitals and nursing note reviewed.  Constitutional:      General: She is not in acute distress.    Appearance: She is well-developed. She is not diaphoretic.  HENT:     Head: Normocephalic and atraumatic.     Right Ear: External ear normal.     Left Ear: External ear normal.     Nose: Nose normal.     Mouth/Throat:     Mouth: Mucous membranes are not dry.  Eyes:     Conjunctiva/sclera: Conjunctivae normal.  Neck:     Vascular: Normal carotid pulses. No JVD.     Trachea: Trachea normal. No tracheal deviation.  Cardiovascular:     Rate and Rhythm: Normal rate and regular rhythm.     Pulses: No decreased pulses.          Radial pulses are 2+ on the right side and 2+ on the left side.     Heart sounds: S1 normal and S2 normal. Murmur heard.     Comments: Systolic murmur heard over the left sternal border area and upper right sternal border Pulmonary:     Effort: Pulmonary effort is normal. No respiratory distress.     Breath sounds: No wheezing, rhonchi or rales.  Chest:     Chest wall: No tenderness.  Abdominal:     General: Bowel sounds are normal.     Palpations: Abdomen is soft.     Tenderness: There is no abdominal tenderness. There is no guarding or rebound.     Comments: No focal tenderness  Musculoskeletal:        General: Normal range of motion.     Cervical back: Normal  range of motion and neck supple. No muscular tenderness.     Right lower leg: No edema.     Left lower leg: No edema.  Skin:    General: Skin is warm and dry.     Coloration: Skin is not pale.     Findings: No rash.  Neurological:     General: No  focal deficit present.     Mental Status: She is alert. Mental status is at baseline.     Motor: No weakness.  Psychiatric:        Mood and Affect: Mood normal.     ED Results / Procedures / Treatments   Labs (all labs ordered are listed, but only abnormal results are displayed) Labs Reviewed  COMPREHENSIVE METABOLIC PANEL WITH GFR - Abnormal; Notable for the following components:      Result Value   Potassium 2.8 (*)    Glucose, Bld 128 (*)    Creatinine, Ser 1.38 (*)    Calcium 8.3 (*)    Total Protein 6.3 (*)    Albumin 2.4 (*)    GFR, Estimated 40 (*)    All other components within normal limits  CBC - Abnormal; Notable for the following components:   WBC 14.5 (*)    All other components within normal limits  LIPASE, BLOOD  URINALYSIS, ROUTINE W REFLEX MICROSCOPIC  MAGNESIUM  TROPONIN I (HIGH SENSITIVITY)    ED ECG REPORT   Date: 01/28/2024  Rate: 73  Rhythm: normal sinus rhythm  QRS Axis: normal  Intervals: normal  ST/T Wave abnormalities: nonspecific T wave changes  Conduction Disutrbances:none  Narrative Interpretation:   Old EKG Reviewed: changes noted, TW abnl increased laterally since 03/2023  I have personally reviewed the EKG tracing and agree with the computerized printout as noted.   Radiology CT ABDOMEN PELVIS W CONTRAST Result Date: 01/28/2024 CLINICAL DATA:  Abdominal pain.  Elevated white count. EXAM: CT ABDOMEN AND PELVIS WITH CONTRAST TECHNIQUE: Multidetector CT imaging of the abdomen and pelvis was performed using the standard protocol following bolus administration of intravenous contrast. RADIATION DOSE REDUCTION: This exam was performed according to the departmental dose-optimization program which  includes automated exposure control, adjustment of the mA and/or kV according to patient size and/or use of iterative reconstruction technique. CONTRAST:  75mL OMNIPAQUE IOHEXOL 300 MG/ML  SOLN COMPARISON:  CT abdomen pelvis dated 09/12/2019. FINDINGS: Lower chest: The visualized lung bases are clear. No intra-abdominal free air.  Trace free fluid in the pelvis. Hepatobiliary: No focal liver abnormality is seen. No gallstones, gallbladder wall thickening, or biliary dilatation. Pancreas: Unremarkable. No pancreatic ductal dilatation or surrounding inflammatory changes. Spleen: Normal in size without focal abnormality. Adrenals/Urinary Tract: The adrenal glands are unremarkable. There is mild left hydronephrosis. No obstructing stone noted. Focal area of narrowing of the proximal left ureter at the ureteropelvic junction may be related to peristalsis or represent an area of stricture. Underlying urothelial lesion is not excluded. Additionally there are parent areas of mild urothelial thickening and nodularity involving the left renal collecting system (coronal 73/8). Further evaluation with dedicated multiphasic CT urography or direct visualization with scope recommended. The right kidney is unremarkable. The urinary bladder is minimally distended and grossly unremarkable. Stomach/Bowel: Mild sigmoid diverticulosis. There is diffuse thickening of the colon with pericolonic stranding and mucosal enhancement consistent with pancolitis. There is a small hiatal hernia. There is no bowel obstruction. The appendix is not visualized with certainty. No inflammatory changes identified in the right lower quadrant. Vascular/Lymphatic: Advanced aortoiliac atherosclerotic disease. The IVC is unremarkable. No portal venous gas. There is no adenopathy. Reproductive: The uterus is anteverted and grossly unremarkable. No suspicious adnexal masses. Other: None Musculoskeletal: Osteopenia with scoliosis and degenerative changes of the  spine. Grade 1 L4-L5 anterolisthesis. No acute osseous pathology. IMPRESSION: 1. Pancolitis. No bowel obstruction. 2. Mild left hydronephrosis. Focal area of narrowing of  the proximal left ureter at the UPJ as well as additional faint urothelial thickening of the left renal collecting system noted. Underlying urothelial lesion is not excluded. Further evaluation with dedicated multiphasic CT urography or direct visualization with scope recommended. 3. Mild sigmoid diverticulosis. Electronically Signed   By: Angus Bark M.D.   On: 01/28/2024 14:41   DG Chest 2 View Result Date: 01/28/2024 CLINICAL DATA:  Weakness EXAM: CHEST - 2 VIEW COMPARISON:  Chest radiograph dated 09/12/2019 FINDINGS: Low lung volumes with bronchovascular crowding. Bibasilar hazy and patchy opacities. No pleural effusion or pneumothorax. Enlarged cardiomediastinal silhouette is likely projectional. Bilateral shoulder arthroplasties. Cervical spinal fixation hardware appears intact. IMPRESSION: Low lung volumes with bronchovascular crowding. Bibasilar hazy and patchy opacities, likely atelectasis. Aspiration or pneumonia can be considered in the appropriate clinical setting. Electronically Signed   By: Limin  Xu M.D.   On: 01/28/2024 13:29    Procedures Procedures    Medications Ordered in ED Medications  amoxicillin-clavulanate (AUGMENTIN) 875-125 MG per tablet 1 tablet (has no administration in time range)  sodium chloride 0.9 % bolus 1,000 mL (0 mLs Intravenous Stopped 01/28/24 1256)  potassium chloride SA (KLOR-CON M) CR tablet 40 mEq (40 mEq Oral Given 01/28/24 1146)  potassium chloride 10 mEq in 100 mL IVPB (0 mEq Intravenous Stopped 01/28/24 1256)  iohexol (OMNIPAQUE) 300 MG/ML solution 75 mL (75 mLs Intravenous Contrast Given 01/28/24 1342)    ED Course/ Medical Decision Making/ A&P    Patient seen and examined. History obtained directly from patient.   Labs/EKG: Ordered CBC, CMP, lipase, troponin, magnesium.   EKG.  Imaging: Ordered chest x-ray, CT abdomen pelvis.  Medications/Fluids: Ordered: IV fluid bolus.   Most recent vital signs reviewed and are as follows: BP (!) 111/51 (BP Location: Right Arm)   Pulse 77   Temp 97.6 F (36.4 C) (Oral)   Resp 20   Ht 4\' 11"  (1.499 m)   Wt 64 kg   LMP  (LMP Unknown)   SpO2 96%   BMI 28.50 kg/m   Initial impression: Fatigue, recent diarrhea, abdominal pain, concern for anemia given patient's history of rectal bleeding.  2:55 PM Reassessment performed. Patient appears improved.  Tolerating fluids.  Labs personally reviewed and interpreted including: CBC with elevated white blood cell count of 14.5 otherwise normal; CMP low potassium of 2.8, glucose 128, creatinine mildly above baseline at 1.38 with a BUN of 17; lipase normal; magnesium normal; UA without compelling signs of infection; troponin normal.  Imaging personally visualized and interpreted including: Chest x-ray with question of bibasilar opacities, likely atelectasis, agree clinically atelectasis with patient without clinical signs of pneumonia; CT scan of the abdomen pelvis, agree pancolitis, radiology noted faint urothelial thickening of the left renal collecting system which will need to be followed up as outpatient.  Reviewed pertinent lab work and imaging with patient at bedside. Questions answered.  I asked the patient if she feels comfortable with going home today.  She seems very adamant about going home.  We discussed plan for oral antibiotics as well as increasing potassium temporarily.  She is in agreement with this.  Family member at bedside in agreement as well.  Most current vital signs reviewed and are as follows: BP (!) 111/47   Pulse 74   Temp 97.6 F (36.4 C)   Resp 19   Ht 4\' 11"  (1.499 m)   Wt 64 kg   LMP  (LMP Unknown)   SpO2 95%   BMI 28.50 kg/m  Plan: Discharge to home.   Prescriptions written for: Augmentin, 20 mill equivalents potassium twice daily for 10  days.  Other home care instructions discussed: Maintain good hydration, close monitoring of symptoms  ED return instructions discussed: The patient was urged to return to the Emergency Department immediately with worsening of current symptoms, worsening abdominal pain, persistent vomiting, blood noted in stools, fever, or any other concerns. The patient verbalized understanding.   Follow-up instructions discussed: Patient encouraged to follow-up with their PCP in 2-3 days.                                Medical Decision Making Amount and/or Complexity of Data Reviewed Labs: ordered. Radiology: ordered.  Risk Prescription drug management.   For this patient's complaint of abdominal pain, the following conditions were considered on the differential diagnosis: gastritis/PUD, enteritis/duodenitis, appendicitis, cholelithiasis/cholecystitis, cholangitis, pancreatitis, ruptured viscus, colitis, diverticulitis, small/large bowel obstruction, proctitis, cystitis, pyelonephritis, ureteral colic, aortic dissection, aortic aneurysm. In women, ectopic pregnancy, pelvic inflammatory disease, ovarian cysts, and tubo-ovarian abscess were also considered. Atypical chest etiologies were also considered including ACS, PE, and pneumonia.  Patient's appearance and workup overall today is reassuring.  Patient is tolerating fluids.  CT demonstrates pancolitis which is consistent with patient's recent migratory abdominal pains and diarrhea.  She was sent over due to profound weakness and fatigue.  This was improved after IV fluids.  Patient is ambulatory with assistive device.  Fortunately, she was not found to be bleeding.  Hemoccult performed at PCP office was negative and hemoglobin is normal.  Do not suspect recurrent peptic ulcer disease or bleeding today.  Patient will be started on antibiotics for colitis.  She has hypokalemia likely related to recent diarrhea.  She was repleted orally and with 1 run IV here.   She will continue increased dose at home.  Discussed and offered admission with patient however she is adamant about going home today.  Given support system and current appearance, I feel it is reasonable.  The patient's vital signs, pertinent lab work and imaging were reviewed and interpreted as discussed in the ED course. Hospitalization was considered for further testing, treatments, or serial exams/observation. However as patient is well-appearing, has a stable exam, and reassuring studies today, I do not feel that they warrant admission at this time. This plan was discussed with the patient who verbalizes agreement and comfort with this plan and seems reliable and able to return to the Emergency Department with worsening or changing symptoms.          Final Clinical Impression(s) / ED Diagnoses Final diagnoses:  Pancolitis (HCC)  Hypokalemia    Rx / DC Orders ED Discharge Orders          Ordered    amoxicillin-clavulanate (AUGMENTIN) 875-125 MG tablet  Every 12 hours        01/28/24 1452    potassium chloride SA (KLOR-CON M) 20 MEQ tablet  2 times daily        01/28/24 1452              Lyna Sandhoff, PA-C 01/28/24 1501    Russella Courts A, DO 02/02/24 [phone]

## 2024-01-28 NOTE — ED Notes (Signed)
 Attempted to get urine rt when pt placed in room , she missed the hat to collect

## 2024-01-28 NOTE — Discharge Instructions (Signed)
 Please read and follow all provided instructions.  Your diagnoses today include:  1. Pancolitis (HCC)   2. Hypokalemia     Tests performed today include: Complete blood cell count: Your white blood cell count was high Complete metabolic panel: Your kidney function was slightly weak and your potassium is low Lipase (pancreas function test): Was normal Urinalysis (urine test): No signs of infection Magnesium was normal X-ray without an infection or other problems CT scan of your abdomen pelvis shows inflammation of the colon Vital signs. See below for your results today.   Medications prescribed:  Augmentin - antibiotic  You have been prescribed an antibiotic medicine: take the entire course of medicine even if you are feeling better. Stopping early can cause the antibiotic not to work.  Please take the higher dose of potassium for 10 days and then restart your regular dose.  Take any prescribed medications only as directed.  Home care instructions:  Follow any educational materials contained in this packet.  Follow-up instructions: Please follow-up with your primary care provider in the next 2-3 days for further evaluation of your symptoms.    Return instructions:  SEEK IMMEDIATE MEDICAL ATTENTION IF: The pain does not go away or becomes severe  A temperature above 101F develops  Repeated vomiting occurs (multiple episodes)  The pain becomes localized to portions of the abdomen. The right side could possibly be appendicitis. In an adult, the left lower portion of the abdomen could be colitis or diverticulitis.  Blood is being passed in stools or vomit (bright red or black tarry stools)  You develop chest pain, difficulty breathing, dizziness or fainting, or become confused, poorly responsive, or inconsolable (young children) If you have any other emergent concerns regarding your health  Additional Information: Abdominal (belly) pain can be caused by many things. Your  caregiver performed an examination and possibly ordered blood/urine tests and imaging (CT scan, x-rays, ultrasound). Many cases can be observed and treated at home after initial evaluation in the emergency department. Even though you are being discharged home, abdominal pain can be unpredictable. Therefore, you need a repeated exam if your pain does not resolve, returns, or worsens. Most patients with abdominal pain don't have to be admitted to the hospital or have surgery, but serious problems like appendicitis and gallbladder attacks can start out as nonspecific pain. Many abdominal conditions cannot be diagnosed in one visit, so follow-up evaluations are very important.  Your vital signs today were: BP (!) 111/47   Pulse 74   Temp 97.6 F (36.4 C)   Resp 19   Ht 4\' 11"  (1.499 m)   Wt 64 kg   LMP  (LMP Unknown)   SpO2 95%   BMI 28.50 kg/m  If your blood pressure (bp) was elevated above 135/85 this visit, please have this repeated by your doctor within one month. --------------

## 2024-01-28 NOTE — ED Notes (Signed)
 Pt up to [facility] to get ua spec

## 2024-01-28 NOTE — ED Notes (Signed)
 Up to [facility] to void

## 2024-01-31 ENCOUNTER — Ambulatory Visit: Payer: Medicare Other | Admitting: Cardiology

## 2024-02-04 DIAGNOSIS — E876 Hypokalemia: Secondary | ICD-10-CM | POA: Diagnosis not present

## 2024-02-04 DIAGNOSIS — K921 Melena: Secondary | ICD-10-CM | POA: Diagnosis not present

## 2024-02-04 DIAGNOSIS — R197 Diarrhea, unspecified: Secondary | ICD-10-CM | POA: Diagnosis not present

## 2024-02-05 ENCOUNTER — Encounter (HOSPITAL_BASED_OUTPATIENT_CLINIC_OR_DEPARTMENT_OTHER): Payer: Self-pay | Admitting: Emergency Medicine

## 2024-02-05 ENCOUNTER — Other Ambulatory Visit: Payer: Self-pay

## 2024-02-05 ENCOUNTER — Emergency Department (HOSPITAL_BASED_OUTPATIENT_CLINIC_OR_DEPARTMENT_OTHER)

## 2024-02-05 ENCOUNTER — Emergency Department (HOSPITAL_BASED_OUTPATIENT_CLINIC_OR_DEPARTMENT_OTHER)
Admission: EM | Admit: 2024-02-05 | Discharge: 2024-02-05 | Disposition: A | Discharge status: Home / Self Care | Attending: Emergency Medicine | Admitting: Emergency Medicine

## 2024-02-05 DIAGNOSIS — R799 Abnormal finding of blood chemistry, unspecified: Secondary | ICD-10-CM | POA: Diagnosis not present

## 2024-02-05 DIAGNOSIS — I700 Atherosclerosis of aorta: Secondary | ICD-10-CM | POA: Diagnosis not present

## 2024-02-05 DIAGNOSIS — R899 Unspecified abnormal finding in specimens from other organs, systems and tissues: Secondary | ICD-10-CM

## 2024-02-05 DIAGNOSIS — R42 Dizziness and giddiness: Secondary | ICD-10-CM | POA: Diagnosis not present

## 2024-02-05 DIAGNOSIS — R531 Weakness: Secondary | ICD-10-CM | POA: Diagnosis not present

## 2024-02-05 LAB — COMPREHENSIVE METABOLIC PANEL WITH GFR
ALT: 11 U/L (ref 0–44)
AST: 24 U/L (ref 15–41)
Albumin: 2.3 g/dL — ABNORMAL LOW (ref 3.5–5.0)
Alkaline Phosphatase: 83 U/L (ref 38–126)
Anion gap: 11 (ref 5–15)
BUN: 17 mg/dL (ref 8–23)
CO2: 22 mmol/L (ref 22–32)
Calcium: 9 mg/dL (ref 8.9–10.3)
Chloride: 102 mmol/L (ref 98–111)
Creatinine, Ser: 1.51 mg/dL — ABNORMAL HIGH (ref 0.44–1.00)
GFR, Estimated: 36 mL/min — ABNORMAL LOW (ref >60)
Glucose, Bld: 102 mg/dL — ABNORMAL HIGH (ref 70–99)
Potassium: 5.2 mmol/L — ABNORMAL HIGH (ref 3.5–5.1)
Sodium: 135 mmol/L (ref 135–145)
Total Bilirubin: <0.2 mg/dL (ref 0.0–1.2)
Total Protein: 6.6 g/dL (ref 6.5–8.1)

## 2024-02-05 LAB — CBC WITH DIFFERENTIAL/PLATELET
Abs Immature Granulocytes: 0.1 10*3/uL — ABNORMAL HIGH (ref 0.00–0.07)
Basophils Absolute: 0.1 10*3/uL (ref 0.0–0.1)
Basophils Relative: 1 %
Eosinophils Absolute: 0.1 10*3/uL (ref 0.0–0.5)
Eosinophils Relative: 0 %
HCT: 37.7 % (ref 36.0–46.0)
Hemoglobin: 12.2 g/dL (ref 12.0–15.0)
Immature Granulocytes: 1 %
Lymphocytes Relative: 24 %
Lymphs Abs: 2.9 10*3/uL (ref 0.7–4.0)
MCH: 30.3 pg (ref 26.0–34.0)
MCHC: 32.4 g/dL (ref 30.0–36.0)
MCV: 93.8 fL (ref 80.0–100.0)
Monocytes Absolute: 1.2 10*3/uL — ABNORMAL HIGH (ref 0.1–1.0)
Monocytes Relative: 10 %
Neutro Abs: 7.5 10*3/uL (ref 1.7–7.7)
Neutrophils Relative %: 64 %
Platelets: 334 10*3/uL (ref 150–400)
RBC: 4.02 MIL/uL (ref 3.87–5.11)
RDW: 13.2 % (ref 11.5–15.5)
WBC: 11.8 10*3/uL — ABNORMAL HIGH (ref 4.0–10.5)
nRBC: 0 % (ref 0.0–0.2)

## 2024-02-05 NOTE — ED Provider Notes (Signed)
 [place] [facility] AT [facility] Provider Note   CSN: [id number] Arrival date & time: 02/05/24  1553     History  Chief Complaint  Patient presents with   Abnormal Lab    Alexandra Morrison is a 78 y.o. female.  Patient is a 78 year old female presenting to emergency department for abnormal lab value of a potassium of 6.7 that was drawn by her PCP office yesterday.  Patient was seen on 01/28/2024 and diagnosed with pancolitis and hypokalemia after having diarrhea for several days.  She was sent home on Augmentin  and a prescription for potassium supplements.  She had a repeat check with her PCP office yesterday which prompted her visit today.  She states her last potassium dose was yesterday and she has not out of her prescription.  She admits to generalized weakness and shortness of breath that she has had over the past several weeks.  On her evaluation on 01/28/2024 she had a cardiac workup including a stable EKG, troponins, and chest x-ray demonstrating possible atelectasis versus pneumonia.  For pancolitis she was discharged home with Augmentin .  She states her diarrhea has improved.    The history is provided by the patient. No language interpreter was used.  Abnormal Lab      Home Medications Prior to Admission medications   Medication Sig Start Date End Date Taking? Authorizing Provider  acetaminophen  (TYLENOL ) 500 MG tablet Take 1,000 mg by mouth See admin instructions. Take two tablets (1000 mg) by mouth every morning, may also take two tablets (1000 mg) every 6 hours as needed for pain    [provider]  ALPRAZolam  (XANAX ) 0.5 MG tablet Take 0.5 mg by mouth 3 (three) times daily as needed for anxiety.     [provider]  amoxicillin -clavulanate (AUGMENTIN ) 875-125 MG tablet Take 1 tablet by mouth every 12 (twelve) hours. 01/28/24   Geiple, Joshua, PA-C  amphetamine -dextroamphetamine  (ADDERALL) 30 MG tablet Take 30 mg by mouth 2 (two) times  daily with breakfast and lunch.     [provider]  buPROPion  (WELLBUTRIN  XL) 150 MG 24 hr tablet Take 150 mg by mouth daily with breakfast. Take with a 300 mg tablet for a total dose of 450 mg 08/04/19   [provider]  buPROPion  (WELLBUTRIN  XL) 300 MG 24 hr tablet Take 300 mg by mouth daily with breakfast. Take with a 150 mg tablet for a total dose of 450 mg 12/30/17   [provider]  calcium carbonate (OS-CAL) 600 MG tablet Take 600 mg by mouth 2 (two) times daily.    [provider]  divalproex  (DEPAKOTE  ER) 500 MG 24 hr tablet Take 1,000 mg by mouth daily with breakfast.  03/28/17   [provider]  ferrous sulfate  325 (65 FE) MG tablet Take 325 mg by mouth at bedtime.    [provider]  furosemide  (LASIX ) 20 MG tablet Take 1 tablet (20 mg total) by mouth once a week. 01/11/23   Tobb, Kardie, DO  levothyroxine  (SYNTHROID , LEVOTHROID) 50 MCG tablet Take 50 mcg by mouth daily with breakfast.     [provider]  lovastatin (MEVACOR) 10 MG tablet Take 10 mg by mouth daily with supper. 07/30/19   [provider]  Magnesium  500 MG TABS Take 500 mg by mouth daily after supper.    [provider]  methocarbamol  (ROBAXIN ) 500 MG tablet Take 1 tablet (500 mg total) by mouth every 6 (six) hours as needed for muscle  spasms. Patient taking differently: Take 500 mg by mouth 2 (two) times daily as needed for muscle spasms. 03/14/18   Bissell, Jaclyn M, PA-C  metoprolol  succinate (TOPROL  XL) 25 MG 24 hr tablet Take 0.5 tablets (12.5 mg total) by mouth daily with supper. 04/16/23   Tobb, Kardie, DO  Multiple Vitamins-Minerals (MULTIVITAMIN WITH MINERALS) tablet Take 1 tablet by mouth daily with lunch.     [provider]  pantoprazole  (PROTONIX ) 40 MG tablet Take 1 tablet (40 mg total) by mouth 2 (two) times daily. 09/15/19 09/03/22  Samtani, Jai-Gurmukh, MD  potassium chloride  (KLOR-CON ) 10 MEQ tablet Take 1 tablet (10 mEq  total) by mouth daily. 01/11/23   Tobb, Kardie, DO  potassium chloride  SA (KLOR-CON  M) 20 MEQ tablet Take 1 tablet (20 mEq total) by mouth 2 (two) times daily. 01/28/24   Geiple, Joshua, PA-C  traZODone  (DESYREL ) 100 MG tablet Take 100 mg by mouth at bedtime as needed for sleep.  07/09/19   [provider]      Allergies    Propofol     Review of Systems   Review of Systems  Constitutional:  Positive for fatigue. Negative for chills and fever.  HENT:  Negative for ear pain and sore throat.   Eyes:  Negative for pain and visual disturbance.  Respiratory:  Negative for cough and shortness of breath.   Cardiovascular:  Negative for chest pain and palpitations.  Gastrointestinal:  Negative for abdominal pain and vomiting.  Genitourinary:  Negative for dysuria and hematuria.  Musculoskeletal:  Negative for arthralgias and back pain.  Skin:  Negative for color change and rash.  Neurological:  Positive for weakness. Negative for seizures and syncope.  All other systems reviewed and are negative.   Physical Exam Updated Vital Signs BP (!) 121/59   Pulse 81   Temp 97.7 F (36.5 C) (Oral)   Resp 16   Wt 64 kg   LMP  (LMP Unknown)   SpO2 96%   BMI 28.50 kg/m  Physical Exam Vitals and nursing note reviewed.  Constitutional:      General: She is not in acute distress.    Appearance: She is well-developed.  HENT:     Head: Normocephalic and atraumatic.  Eyes:     Conjunctiva/sclera: Conjunctivae normal.  Cardiovascular:     Rate and Rhythm: Normal rate and regular rhythm.     Heart sounds: No murmur heard. Pulmonary:     Effort: Pulmonary effort is normal. No respiratory distress.     Breath sounds: Normal breath sounds.  Abdominal:     Palpations: Abdomen is soft.     Tenderness: There is no abdominal tenderness.  Musculoskeletal:        General: No swelling.     Cervical back: Neck supple.  Skin:    General: Skin is warm and dry.     Capillary Refill: Capillary  refill takes less than 2 seconds.  Neurological:     Mental Status: She is alert.  Psychiatric:        Mood and Affect: Mood normal.     ED Results / Procedures / Treatments   Labs (all labs ordered are listed, but only abnormal results are displayed) Labs Reviewed  CBC WITH DIFFERENTIAL/PLATELET - Abnormal; Notable for the following components:      Result Value   WBC 11.8 (*)    Monocytes Absolute 1.2 (*)    Abs Immature Granulocytes 0.10 (*)    All other components within normal limits  COMPREHENSIVE METABOLIC PANEL WITH GFR - Abnormal; Notable for the following components:   Potassium 5.2 (*)    Glucose, Bld 102 (*)    Creatinine, Ser 1.51 (*)    Albumin 2.3 (*)    GFR, Estimated 36 (*)    All other components within normal limits    EKG EKG Interpretation Date/Time:  Wednesday February 05 2024 16:04:04 EDT Ventricular Rate:  99 PR Interval:  146 QRS Duration:  76 QT Interval:  332 QTC Calculation: 426 R Axis:   29  Text Interpretation: Sinus rhythm Abnormal R-wave progression, early transition Confirmed by Owen Blowers ([id number]) on 02/05/2024 9:53:31 PM  Radiology No results found.  Procedures Procedures    Medications Ordered in ED Medications - No data to display  ED Course/ Medical Decision Making/ A&P                                 Medical Decision Making Amount and/or Complexity of Data Reviewed Labs: ordered. Radiology: ordered.   48:48 PM  78 year old female presenting to emergency department for abnormal lab value of a potassium of 6.7 that was drawn by her PCP office yesterday.  Please see HPI for further history and for relevant chart review.  On exam patient is alert and oriented x 3, no acute distress, afebrile, stable vital signs.  Physical exam demonstrates no specific abnormalities.  Potassium 5.2 on today's collection.  I recommend she stops taking her potassium supplements that were given to her after diarrheal syndrome.  EKG stable with no  signs of rhythm changes.    Will repeat chest x-ray to confirm that pneumonia is improving.  Otherwise patient's pancolitis symptoms seem to have been improving.  Stable for discharge.  Patient in no distress and overall condition improved here in the ED. Detailed discussions were had with the patient regarding current findings, and need for close f/u with PCP or on call doctor. The patient has been instructed to return immediately if the symptoms worsen in any way for re-evaluation. Patient verbalized understanding and is in agreement with current care plan. All questions answered prior to discharge.         Final Clinical Impression(s) / ED Diagnoses Final diagnoses:  Abnormal laboratory test result    Rx / DC Orders ED Discharge Orders     None         Quinn Bucco, DO 02/09/24 1426

## 2024-02-05 NOTE — ED Triage Notes (Signed)
 Pt reports was called by PCP reporting critical high potassium level and to be evaluated att the ER . Pt reports dizziness and lethargic . Alert and oriented x 4 .

## 2024-02-05 NOTE — Discharge Instructions (Signed)
 Potassium was stable on our lab redraw.  Your chest x-ray demonstrates improving pneumonia.  Continue to rest, increase hydration, and vitamin C.  Follow-up with your primary care physician for further workup.  Do not take any additional potassium at this time.

## 2024-02-06 DIAGNOSIS — K921 Melena: Secondary | ICD-10-CM | POA: Diagnosis not present

## 2024-02-10 DIAGNOSIS — R933 Abnormal findings on diagnostic imaging of other parts of digestive tract: Secondary | ICD-10-CM | POA: Diagnosis not present

## 2024-02-10 DIAGNOSIS — R197 Diarrhea, unspecified: Secondary | ICD-10-CM | POA: Diagnosis not present

## 2024-02-12 DIAGNOSIS — R933 Abnormal findings on diagnostic imaging of other parts of digestive tract: Secondary | ICD-10-CM | POA: Diagnosis not present

## 2024-02-12 DIAGNOSIS — K5120 Ulcerative (chronic) proctitis without complications: Secondary | ICD-10-CM | POA: Diagnosis not present

## 2024-02-12 DIAGNOSIS — D128 Benign neoplasm of rectum: Secondary | ICD-10-CM | POA: Diagnosis not present

## 2024-02-12 DIAGNOSIS — R197 Diarrhea, unspecified: Secondary | ICD-10-CM | POA: Diagnosis not present

## 2024-02-12 DIAGNOSIS — D125 Benign neoplasm of sigmoid colon: Secondary | ICD-10-CM | POA: Diagnosis not present

## 2024-02-12 DIAGNOSIS — K6389 Other specified diseases of intestine: Secondary | ICD-10-CM | POA: Diagnosis not present

## 2024-02-24 DIAGNOSIS — G4719 Other hypersomnia: Secondary | ICD-10-CM | POA: Diagnosis not present

## 2024-02-26 DIAGNOSIS — M810 Age-related osteoporosis without current pathological fracture: Secondary | ICD-10-CM | POA: Diagnosis not present

## 2024-02-26 DIAGNOSIS — E1121 Type 2 diabetes mellitus with diabetic nephropathy: Secondary | ICD-10-CM | POA: Diagnosis not present

## 2024-02-26 DIAGNOSIS — R600 Localized edema: Secondary | ICD-10-CM | POA: Diagnosis not present

## 2024-02-26 DIAGNOSIS — K279 Peptic ulcer, site unspecified, unspecified as acute or chronic, without hemorrhage or perforation: Secondary | ICD-10-CM | POA: Diagnosis not present

## 2024-02-26 DIAGNOSIS — N1330 Unspecified hydronephrosis: Secondary | ICD-10-CM | POA: Diagnosis not present

## 2024-02-26 DIAGNOSIS — F3181 Bipolar II disorder: Secondary | ICD-10-CM | POA: Diagnosis not present

## 2024-02-26 DIAGNOSIS — Z9181 History of falling: Secondary | ICD-10-CM | POA: Diagnosis not present

## 2024-02-26 DIAGNOSIS — E785 Hyperlipidemia, unspecified: Secondary | ICD-10-CM | POA: Diagnosis not present

## 2024-03-09 DIAGNOSIS — F3110 Bipolar disorder, current episode manic without psychotic features, unspecified: Secondary | ICD-10-CM | POA: Diagnosis not present

## 2024-03-30 DIAGNOSIS — G4733 Obstructive sleep apnea (adult) (pediatric): Secondary | ICD-10-CM | POA: Diagnosis not present

## 2024-04-07 DIAGNOSIS — G4700 Insomnia, unspecified: Secondary | ICD-10-CM | POA: Diagnosis not present

## 2024-04-07 DIAGNOSIS — G4733 Obstructive sleep apnea (adult) (pediatric): Secondary | ICD-10-CM | POA: Diagnosis not present

## 2024-04-17 DIAGNOSIS — N130 Hydronephrosis with ureteropelvic junction obstruction: Secondary | ICD-10-CM | POA: Diagnosis not present

## 2024-04-21 ENCOUNTER — Other Ambulatory Visit: Payer: Self-pay | Admitting: Urology

## 2024-04-21 ENCOUNTER — Telehealth: Payer: Self-pay | Admitting: Cardiology

## 2024-04-21 NOTE — Progress Notes (Deleted)
   Cardiology Office Note    Date:  04/21/2024  ID:  Alexandra, Morrison December 12, 1945, MRN [id number] PCP:  Alexandra Josette HERO, PA-C (Inactive)  Cardiologist:  Alexandra Huntsman, DO  Electrophysiologist:  None   Chief Complaint: ***  History of Present Illness: .    Alexandra Morrison is a 78 y.o. female with visit-pertinent history of suspected UGIB with ABL anemia in 2020, PSVT, moderate carotid disease by duplex 02/2022, CKD stage ?3b by prior labs, mild MS/AS, DM, HLD, bipolar disorder, lumbar DDD, sleep apnea, hypothyroidism seen for preop evaluation.   She was previously evaluated by Dr. Huntsman for fatigue, murmur, and palpitations. Monitor 09/2022 showed 16 brief runs of SVT/posisble a-tach with rare PACs, PVCs, prompting addition of low dose metoprolol . 2D echo 01/2023 showed EF 60-65%, mildly dilated LA, mild MS, trivial MR, mild AS. Nuclear stress test 01/2023 was normal.  Other issues include GIB in 2020; per notes, Colonoscopy and endoscopy performed this admission showing mainly sigmoid diverticulosis internal hemorrhoids on colonoscopy however on EGD clean-based anastomotic ulcer with small amount of oozing that spontaneously stopped. Carotid duplex 02/2022 ordered by outside family medicine provider for vertigo showed 50-69% left internal carotid disease, moderate right sided plaque not resulting in hemodynamically significant stenosis. Report indicated that further evaluation with CTA could be performed as clinically indicated. Do not see this pursued. She also had ED visit in 01/2024 for hyperkalemia following supplementation for hypokalemia while ill with pancolitis.   We were contacted yesterday for preoperative evaluation for urologic procedure scheduled 04/28/24; cystoscopy with left ureteroscopy possible biopsy and possible stent placement under general anesthesia.  Carotid??  Preoperative CV evaluation PSVT Mild MR/AS Carotid artery disease  Labwork independently  reviewed: [facility] 02/2024 A1c 6.2, Cr 1.20, K 3.5, LFTs wnl 01/2024 K 5.2, Cr 1.51, alb 2.3, AST ALT OK, WBC 11.8, Hgb 12.2, plt 334, trop neg, Mg 1.8 2024 pBNP wnl; TSH OK, LDL 76, trig 90 by [facility] 2020 TSH OK  ROS: .    Please see the history of present illness. Otherwise, review of systems is positive for ***.  All other systems are reviewed and otherwise negative.  Studies Reviewed: SABRA    EKG:  EKG is ordered today, personally reviewed, demonstrating ***  CV Studies: Cardiac studies reviewed are outlined and summarized above. Otherwise please see EMR for full report.   Current Reported Medications:.    No outpatient medications have been marked as taking for the 04/22/24 encounter (Appointment) with Alexandra Meine N, PA-C.    Physical Exam:    VS:  LMP  (LMP Unknown)    Wt Readings from Last 3 Encounters:  02/05/24 141 lb 1.5 oz (64 kg)  01/28/24 141 lb 1.5 oz (64 kg)  04/16/23 141 lb 6.4 oz (64.1 kg)    GEN: Well nourished, well developed in no acute distress NECK: No JVD; No carotid bruits CARDIAC: ***RRR, no murmurs, rubs, gallops RESPIRATORY:  Clear to auscultation without rales, wheezing or rhonchi  ABDOMEN: Soft, non-tender, non-distended EXTREMITIES:  No edema; No acute deformity   Asessement and Plan:.     ***     Disposition: F/u with ***  Signed, Alexandra Jabs Morrison Terrence Pizana, PA-C

## 2024-04-21 NOTE — Telephone Encounter (Signed)
   Name: Alexandra Morrison  DOB: [birth year]  MRN: [id number]  Primary Cardiologist: Kardie Tobb, DO  Chart reviewed as part of pre-operative protocol coverage. Because of [name]'s past medical history and time since last visit, she will require a follow-up in-office visit in order to better assess preoperative cardiovascular risk.  Patient will need appointment date moved up which is currently scheduled for 05/20/2024 to accommodate surgical date of 04/28/2024    Pre-op covering staff: - Please schedule appointment and call patient to inform them. If patient already had an upcoming appointment within acceptable timeframe, please add pre-op clearance to the appointment notes so provider is aware. - Please contact requesting surgeon's office via preferred method (i.e, phone, fax) to inform them of need for appointment prior to surgery.    Wyn Raddle, Jackee Shove, NP  04/21/2024, 4:53 PM

## 2024-04-21 NOTE — Telephone Encounter (Signed)
   Pre-operative Risk Assessment    Patient Name: Alexandra Morrison  DOB: October 26, 1945 MRN: [id number]      Request for Surgical Clearance    Procedure:  Cystoscopy with left ureteroscopy possible biopsy and possible stent placement   Date of Surgery:  Clearance 04/28/24                                 Surgeon: Dr. Lonni Han Surgeon's Group or Practice Name: [facility] Phone number: [phone] Fax number: [phone]   Type of Clearance Requested:   - Medical    Type of Anesthesia:  General    Additional requests/questions:     Bonney Willie Daring   04/21/2024, 4:30 PM

## 2024-04-21 NOTE — Telephone Encounter (Signed)
 Pt has been scheduled to see Dayna Dunn, [facility] 04/22/24 @ 3:35 for preop clearance. I stated to the pt that I will not cancel the 05/20/24 9 month f/u; that she can d/w PAC if needs to keep the 05/20/24 appt. Pt thanked med for the help.

## 2024-04-22 ENCOUNTER — Ambulatory Visit: Admitting: Physician Assistant

## 2024-04-22 ENCOUNTER — Encounter (HOSPITAL_COMMUNITY): Payer: Self-pay

## 2024-04-22 DIAGNOSIS — I471 Supraventricular tachycardia, unspecified: Secondary | ICD-10-CM

## 2024-04-22 DIAGNOSIS — I6523 Occlusion and stenosis of bilateral carotid arteries: Secondary | ICD-10-CM

## 2024-04-22 DIAGNOSIS — I050 Rheumatic mitral stenosis: Secondary | ICD-10-CM

## 2024-04-22 DIAGNOSIS — Z01810 Encounter for preprocedural cardiovascular examination: Secondary | ICD-10-CM

## 2024-04-22 DIAGNOSIS — I350 Nonrheumatic aortic (valve) stenosis: Secondary | ICD-10-CM

## 2024-04-22 NOTE — Progress Notes (Signed)
  Date of COVID positive in last 90 days:  ERE:Duzeyzw Nanci, MD Cardiologist - Dub Huntsman, DO  Chest x-ray - 02-05-24 Epic EKG - 02-05-24 Epic Stress Test - 02-15-23 Epic ECHO - 02-12-23 Epic Cardiac Cath -  Pacemaker/ICD device last checked: Spinal Cord Stimulator: Long Term Monitor - 09-27-22 Epic  Bowel Prep -   Sleep Study - Yes, +sleep apnea CPAP -   Fasting Blood Sugar -  Checks Blood Sugar _____ times a day  Last dose of GLP1 agonist-  N/A GLP1 instructions:  Do not take after     Last dose of SGLT-2 inhibitors-  N/A SGLT-2 instructions:  Do not take after    Blood Thinner Instructions:  Last dose:   Time: Aspirin  Instructions: Last Dose:  Activity level:  Can go up a flight of stairs and perform activities of daily living without stopping and without symptoms of chest pain or shortness of breath.  Able to exercise without symptoms  Unable to go up a flight of stairs without symptoms of     Anesthesia review: Abnormal R wave progression on EKG, mild aortic stenosis, PSVT, shortness of breath  Patient denies shortness of breath, fever, cough and chest pain at PAT appointment  Patient verbalized understanding of instructions that were given to them at the PAT appointment. Patient was also instructed that they will need to review over the PAT instructions again at home before surgery.

## 2024-04-22 NOTE — Telephone Encounter (Addendum)
 Left message for surgery scheduler as FYI pt did not show for her appt today that we brought her in sooner for the preop clearance. We do not have sooner appts before her scheduled procedure with Dr. Devere. Pt missed her appt today for 3:35, per pt that she overslept.   I will let the surgeon office know.   I will also let PAC Dayna Dunn know as well that we do not have any sooner appts in time for her surgery.   Pt has appt 05/20/24 with Dr. Sheena. We can get her in sooner with an appt however not in time for her procedure.

## 2024-04-22 NOTE — Telephone Encounter (Signed)
 Pt was being scheduled with Barnie Hila, NP while I was calling the surgeon office.  The appt was in process of being scheduled, which explains when I looked for an appt there where none, though I still could not see her appt being scheduled.   I will update all parties pt has appt 04/27/24 10:05 AM Barnie Hila, NP

## 2024-04-22 NOTE — Telephone Encounter (Signed)
 Patient no-showed for pre-op appointment today.   Prior to her appointment today, I reviewed her case with Dr. Anner (DOD) since Dr. Sheena is out of the office and I had not met her before. In 2023 she had at least moderate carotid disease in the 50-69% range on duplex obtained by primary care. Was recommended to consider CTA if clinically indicated. Not clear whether this was ever repeated or whether carotid stenoses was trended. We both agreed it would be most prudent to evaluate this prior to undergoing surgery given plans for general anesthesia - would be CTA head/neck if creatinine was OK. However, she did not attend her OV.   Niels, can you please call her and ask to reschedule ASAP? Once this is rescheduled please route update to surgeon to let them know that further workup may be warranted based on the discussions in that OV. Appreciate it!

## 2024-04-22 NOTE — Patient Instructions (Signed)
 SURGICAL WAITING ROOM VISITATION Patients having surgery or a procedure may have no more than 2 support people in the waiting area - these visitors may rotate.    Children under the age of 66 must have an adult with them who is not the patient.  If the patient needs to stay at the hospital during part of their recovery, the visitor guidelines for inpatient rooms apply. Pre-op nurse will coordinate an appropriate time for 1 support person to accompany patient in pre-op.  This support person may not rotate.    Please refer to the [facility] website for the visitor guidelines for Inpatients (after your surgery is over and you are in a regular room).       Your procedure is scheduled on: 04-28-24   Report to [facility] Main Entrance    Report to admitting at 6:30 AM   Call this number if you have problems the morning of surgery [phone]   Do not eat food or drink liquids:After Midnight.           If you have questions, please contact your surgeon's office.   FOLLOW  ANY ADDITIONAL PRE OP INSTRUCTIONS YOU RECEIVED FROM YOUR SURGEON'S OFFICE!!!     Oral Hygiene is also important to reduce your risk of infection.                                    Remember - BRUSH YOUR TEETH THE MORNING OF SURGERY WITH YOUR REGULAR TOOTHPASTE   Do NOT smoke after Midnight   Take these medicines the morning of surgery with A SIP OF WATER :    Bupropion    Divalproex    Levothyroxine    Lovastatin   Metoprolol    Pantoprazole    If needed Alprazolam , Tylenol   Stop all vitamins and herbal supplements 7 days before surgery  Bring CPAP mask and tubing day of surgery.                              You may not have any metal on your body including hair pins, jewelry, and body piercing             Do not wear make-up, lotions, powders, perfumes or deodorant  Do not wear nail polish including gel and S&S, artificial/acrylic nails, or any other type of covering on natural nails including finger  and toenails. If you have artificial nails, gel coating, etc. that needs to be removed by a nail salon please have this removed prior to surgery or surgery may need to be canceled/ delayed if the surgeon/ anesthesia feels like they are unable to be safely monitored.   Do not shave  48 hours prior to surgery.             Do not bring valuables to the hospital. [facility] IS NOT RESPONSIBLE   FOR VALUABLES.   Contacts, dentures or bridgework may not be worn into surgery.  DO NOT BRING YOUR HOME MEDICATIONS TO THE HOSPITAL. PHARMACY WILL DISPENSE MEDICATIONS LISTED ON YOUR MEDICATION LIST TO YOU DURING YOUR ADMISSION IN THE HOSPITAL!    Patients discharged on the day of surgery will not be allowed to drive home.  Someone NEEDS to stay with you for the first 24 hours after anesthesia.   Special Instructions: Bring a copy of your healthcare power of attorney and living will documents the  day of surgery if you haven't scanned them before.              Please read over the following fact sheets you were given: IF YOU HAVE QUESTIONS ABOUT YOUR PRE-OP INSTRUCTIONS PLEASE CALL [phone] [facility]  If you received a COVID test during your pre-op visit  it is requested that you wear a mask when out in public, stay away from anyone that may not be feeling well and notify your surgeon if you develop symptoms. If you test positive for Covid or have been in contact with anyone that has tested positive in the last 10 days please notify you surgeon.  [facility] - Preparing for Surgery Before surgery, you can play an important role.  Because skin is not sterile, your skin needs to be as free of germs as possible.  You can reduce the number of germs on your skin by washing with CHG (chlorahexidine gluconate) soap before surgery.  CHG is an antiseptic cleaner which kills germs and bonds with the skin to continue killing germs even after washing. Please DO NOT use if you have an allergy to CHG or antibacterial soaps.   If your skin becomes reddened/irritated stop using the CHG and inform your nurse when you arrive at Short Stay. Do not shave (including legs and underarms) for at least 48 hours prior to the first CHG shower.  You may shave your face/neck.  Please follow these instructions carefully:  1.  Shower with CHG Soap the night before surgery and the  morning of surgery.  2.  If you choose to wash your hair, wash your hair first as usual with your normal  shampoo.  3.  After you shampoo, rinse your hair and body thoroughly to remove the shampoo.                             4.  Use CHG as you would any other liquid soap.  You can apply chg directly to the skin and wash.  Gently with a scrungie or clean washcloth.  5.  Apply the CHG Soap to your body ONLY FROM THE NECK DOWN.   Do   not use on face/ open                           Wound or open sores. Avoid contact with eyes, ears mouth and   genitals (private parts).                       Wash face,  Genitals (private parts) with your normal soap.             6.  Wash thoroughly, paying special attention to the area where your    surgery  will be performed.  7.  Thoroughly rinse your body with warm water  from the neck down.  8.  DO NOT shower/wash with your normal soap after using and rinsing off the CHG Soap.                9.  Pat yourself dry with a clean towel.            10.  Wear clean pajamas.            11.  Place clean sheets on your bed the night of your first shower and do not  sleep with pets. Day of Surgery :  Do not apply any lotions/deodorants the morning of surgery.  Please wear clean clothes to the hospital/surgery center.  FAILURE TO FOLLOW THESE INSTRUCTIONS MAY RESULT IN THE CANCELLATION OF YOUR SURGERY  PATIENT SIGNATURE_________________________________  NURSE SIGNATURE__________________________________  ________________________________________________________________________

## 2024-04-23 ENCOUNTER — Encounter (HOSPITAL_COMMUNITY): Payer: Self-pay

## 2024-04-23 ENCOUNTER — Encounter (HOSPITAL_COMMUNITY)
Admission: RE | Admit: 2024-04-23 | Discharge: 2024-04-23 | Disposition: A | Discharge status: Home / Self Care | Source: Ambulatory Visit | Attending: Urology | Admitting: Urology

## 2024-04-23 ENCOUNTER — Other Ambulatory Visit: Payer: Self-pay

## 2024-04-23 VITALS — BP 133/67 | HR 80 | Temp 98.3°F | Resp 18 | Ht <= 58 in | Wt 135.4 lb

## 2024-04-23 DIAGNOSIS — D649 Anemia, unspecified: Secondary | ICD-10-CM | POA: Insufficient documentation

## 2024-04-23 DIAGNOSIS — Z87891 Personal history of nicotine dependence: Secondary | ICD-10-CM | POA: Diagnosis not present

## 2024-04-23 DIAGNOSIS — N131 Hydronephrosis with ureteral stricture, not elsewhere classified: Secondary | ICD-10-CM | POA: Insufficient documentation

## 2024-04-23 DIAGNOSIS — Z01818 Encounter for other preprocedural examination: Secondary | ICD-10-CM | POA: Insufficient documentation

## 2024-04-23 DIAGNOSIS — F319 Bipolar disorder, unspecified: Secondary | ICD-10-CM | POA: Diagnosis not present

## 2024-04-23 DIAGNOSIS — I080 Rheumatic disorders of both mitral and aortic valves: Secondary | ICD-10-CM | POA: Diagnosis not present

## 2024-04-23 DIAGNOSIS — F419 Anxiety disorder, unspecified: Secondary | ICD-10-CM | POA: Diagnosis not present

## 2024-04-23 DIAGNOSIS — Z9884 Bariatric surgery status: Secondary | ICD-10-CM | POA: Diagnosis not present

## 2024-04-23 DIAGNOSIS — N289 Disorder of kidney and ureter, unspecified: Secondary | ICD-10-CM | POA: Insufficient documentation

## 2024-04-23 DIAGNOSIS — E119 Type 2 diabetes mellitus without complications: Secondary | ICD-10-CM | POA: Diagnosis not present

## 2024-04-23 HISTORY — DX: Sleep apnea, unspecified: G47.30

## 2024-04-23 HISTORY — DX: Unspecified hydronephrosis: N13.30

## 2024-04-23 HISTORY — DX: Gastro-esophageal reflux disease without esophagitis: K21.9

## 2024-04-23 HISTORY — DX: Nonrheumatic aortic (valve) stenosis: I35.0

## 2024-04-23 HISTORY — DX: Type 2 diabetes mellitus without complications: E11.9

## 2024-04-23 HISTORY — DX: Headache, unspecified: R51.9

## 2024-04-23 HISTORY — DX: Depression, unspecified: F32.A

## 2024-04-23 HISTORY — DX: Anemia, unspecified: D64.9

## 2024-04-23 LAB — BASIC METABOLIC PANEL WITH GFR
Anion gap: 10 (ref 5–15)
BUN: 15 mg/dL (ref 8–23)
CO2: 26 mmol/L (ref 22–32)
Calcium: 8.6 mg/dL — ABNORMAL LOW (ref 8.9–10.3)
Chloride: 104 mmol/L (ref 98–111)
Creatinine, Ser: 1.28 mg/dL — ABNORMAL HIGH (ref 0.44–1.00)
GFR, Estimated: 43 mL/min — ABNORMAL LOW (ref >60)
Glucose, Bld: 105 mg/dL — ABNORMAL HIGH (ref 70–99)
Potassium: 4.2 mmol/L (ref 3.5–5.1)
Sodium: 140 mmol/L (ref 135–145)

## 2024-04-23 LAB — CBC
HCT: 38 % (ref 36.0–46.0)
Hemoglobin: 11.8 g/dL — ABNORMAL LOW (ref 12.0–15.0)
MCH: 30.9 pg (ref 26.0–34.0)
MCHC: 31.1 g/dL (ref 30.0–36.0)
MCV: 99.5 fL (ref 80.0–100.0)
Platelets: 180 10*3/uL (ref 150–400)
RBC: 3.82 MIL/uL — ABNORMAL LOW (ref 3.87–5.11)
RDW: 13.5 % (ref 11.5–15.5)
WBC: 6.1 10*3/uL (ref 4.0–10.5)
nRBC: 0 % (ref 0.0–0.2)

## 2024-04-23 LAB — HEMOGLOBIN A1C
Hgb A1c MFr Bld: 4.9 % (ref 4.8–5.6)
Mean Plasma Glucose: 93.93 mg/dL

## 2024-04-23 LAB — GLUCOSE, CAPILLARY: Glucose-Capillary: 108 mg/dL — ABNORMAL HIGH (ref 70–99)

## 2024-04-23 NOTE — Progress Notes (Signed)
 Cardiology Clinic Note   Date: 04/27/2024 ID: Tenlee, Wollin 1946-10-19, MRN [id number]  Primary Cardiologist:  Kardie Tobb, DO  Chief Complaint   Alexandra Morrison is a 78 y.o. female who presents to the clinic today for preoperative risk assessment.   Patient Profile   Alexandra Morrison is followed by Dr. Sheena for the history outlined below.      Past medical history significant for: Palpitations. 14-day ZIO 09/27/2022: HR 57 to 200 bpm, average 86 bpm.  Predominantly sinus rhythm.  16 runs of SVT fastest 6 beats max rate 200 bpm, longest 15.4 seconds average rate 104 bpm.  Some episodes of SVT may be possible A. tach with variable block.  Rare ectopy. Mitral valve/aortic valve stenosis. Echo 02/12/2023: EF 60 to 65%.  No RWMA.  Indeterminate diastolic parameters.  Normal RV size/function.  Normal PA pressure, mild LAE.  Trivial MR.  Mild mitral stenosis.  Severe MAC.  Aortic valve is not well-visualized.  Moderate calcification/thickening of aortic valve.  Mild aortic valve stenosis. Fatigue. Lexiscan  02/15/2023: Normal, low risk study.  Normal LV perfusion with no evidence of ischemia. GI bleed. Hypothyroidism. Hyperlipidemia. Carotid artery disease. Carotid ultrasound 03/02/2022: Moderate left-sided atherosclerotic plaque resulting in elevated peak systolic velocities within the ICA compatible with 50 to 69% narrowing.  Moderate amount of right-sided atherosclerotic plaque not resulting in hemodynamically significant stenosis.  In summary, patient was first evaluated by Dr. Sheena on 09/03/2022 for palpitations and shortness of breath.  14-day ZIO demonstrated 16 runs of SVT as detailed above.  Upon follow-up in March 2024 she complained of worsening dyspnea and fatigue.  Echo demonstrated normal LV/RV function with mild mitral valve/aortic valve stenosis.  Nuclear stress test was a normal low risk study.  Patient was last seen in the office by Dr. Sheena on 04/16/2023 for follow-up  after testing.  She reported feeling improved.  She was tolerating the addition of Toprol  well.     History of Present Illness    Today, patient is accompanied by her husband. Patient denies shortness of breath,  orthopnea or PND. She reports dyspnea with heavier exertion but none with routine activities. She reports mild lower extremity edema related to the heat that is at its best in the morning and progresses throughout the day. No chest pain, pressure, or tightness. No palpitations. She reports mild positional lightheadedness occurring with fast position changes. She does not participate in routine exercise but is independent with ADLs and performs light to moderate household activities. She is pending surgery with urology tomorrow.      ROS: All other systems reviewed and are otherwise negative except as noted in History of Present Illness.  EKGs/Labs Reviewed    EKG Interpretation Date/Time:  Monday April 27 2024 09:54:27 EDT Ventricular Rate:  82 PR Interval:  152 QRS Duration:  72 QT Interval:  380 QTC Calculation: 443 R Axis:   1  Text Interpretation: Normal sinus rhythm Low voltage QRS When compared with ECG of 05-Feb-2024 16:04, No significant change Confirmed by Loistine Sober [phone]) on 04/27/2024 10:04:15 AM   02/05/2024: ALT 11; AST 24 04/23/2024: BUN 15; Creatinine, Ser 1.28; Potassium 4.2; Sodium 140   04/23/2024: Hemoglobin 11.8; WBC 6.1   Physical Exam    VS:  BP 106/60 (BP Location: Left Arm, Patient Position: Sitting, Cuff Size: Normal)   Pulse 82   Ht 4' 8 (1.422 m)   Wt 137 lb 4 oz (62.3 kg)   LMP  (LMP Unknown)  SpO2 97%   BMI 30.77 kg/m  , BMI Body mass index is 30.77 kg/m.  GEN: Well nourished, well developed, in no acute distress. Neck: No JVD or carotid bruits. Cardiac:  RRR. 2/6 systolic murmur. No rubs or gallops.   Respiratory:  Respirations regular and unlabored. Clear to auscultation without rales, wheezing or rhonchi. GI: Soft, nontender,  nondistended. Extremities: Radials/DP/PT 2+ and equal bilaterally. No clubbing or cyanosis. Mild, nonpitting edema bilateral lower extremities.   Skin: Warm and dry, no rash. Neuro: Strength intact.  Assessment & Plan   Palpitations/PSVT 14-day ZIO December 2023 demonstrated HR 57 to 200 bpm, average 86 bpm, predominantly sinus rhythm, 16 runs of SVT fastest 6 beats max rate 200 bpm, longest 15.4 seconds average rate 104 bpm, some episodes of SVT may be A. tach with variable block.  Patient denies recent palpitations. EKG demonstrates normal sinus rhythm 82 bpm.  - Continue Toprol .  Mitral valve/aortic valve stenosis Echo April 2024 demonstrated normal LV/RV function, normal PA pressure, mild mitral valve/aortic valve stenosis.  Patient reports dyspnea with heavier exertion but none with routine activities. She experiences mild lightheadedness with quick position changes. She reports lower extremity edema that is best in the morning and progresses throughout the day typical for her during the summer months. Mild, nonpitting lower extremity edema. 2/6 systolic murmur.  - Update echo in the next 1-2 months. Patient will not need to have echo performed prior to surgery.   Carotid artery disease Carotid ultrasound May 2023 demonstrated moderate left-sided atherosclerotic plaque resulting in elevated peak systolic velocities within the ICA compatible with 50-69% narrowing, moderate amount of right-sided atherosclerotic plaque not resulting in hemodynamically significant stenosis.  Patient reports mild lightheadedness with quick position changes.  - Discuss repeat carotid US  at follow up.   Preoperative cardiovascular risk assessment Cystoscopy with left ureteroscopy possible biopsy and possible stent placement by Dr. Devere on 04/28/2024.  According to the RCRI, patient has a 0.4% risk of MACE. Patient reports activity equivalent to 4.0 METS (performs light to moderate household activities).  -Based on  ACC/AHA guidelines, Alexandra Morrison would be at acceptable risk for the planned procedure without further cardiovascular testing.   Disposition: Repeat echo in the next 1-2 months (not needed prior to surgery). Return in 6 months or sooner as needed.          Signed, Barnie HERO. Arlet Marter, DNP, NP-C

## 2024-04-27 ENCOUNTER — Ambulatory Visit: Attending: Student | Admitting: Student

## 2024-04-27 ENCOUNTER — Encounter: Payer: Self-pay | Admitting: Student

## 2024-04-27 VITALS — BP 106/60 | HR 82 | Ht <= 58 in | Wt 137.2 lb

## 2024-04-27 DIAGNOSIS — I6523 Occlusion and stenosis of bilateral carotid arteries: Secondary | ICD-10-CM | POA: Diagnosis not present

## 2024-04-27 DIAGNOSIS — Z01810 Encounter for preprocedural cardiovascular examination: Secondary | ICD-10-CM | POA: Diagnosis not present

## 2024-04-27 DIAGNOSIS — I080 Rheumatic disorders of both mitral and aortic valves: Secondary | ICD-10-CM | POA: Insufficient documentation

## 2024-04-27 DIAGNOSIS — I350 Nonrheumatic aortic (valve) stenosis: Secondary | ICD-10-CM | POA: Diagnosis not present

## 2024-04-27 DIAGNOSIS — R011 Cardiac murmur, unspecified: Secondary | ICD-10-CM | POA: Diagnosis not present

## 2024-04-27 DIAGNOSIS — I050 Rheumatic mitral stenosis: Secondary | ICD-10-CM | POA: Diagnosis not present

## 2024-04-27 DIAGNOSIS — I4719 Other supraventricular tachycardia: Secondary | ICD-10-CM | POA: Diagnosis not present

## 2024-04-27 DIAGNOSIS — R002 Palpitations: Secondary | ICD-10-CM | POA: Diagnosis not present

## 2024-04-27 NOTE — Patient Instructions (Signed)
 Medication Instructions:   Your physician recommends that you continue on your current medications as directed. Please refer to the Current Medication list given to you today.   *If you need a refill on your cardiac medications before your next appointment, please call your pharmacy*  Lab Work:  No labs ordered today   If you have labs (blood work) drawn today and your tests are completely normal, you will receive your results only by: MyChart Message (if you have MyChart) OR A paper copy in the mail If you have any lab test that is abnormal or we need to change your treatment, we will call you to review the results.  Testing/Procedures:  Your physician has requested that you have an echocardiogram. Echocardiography is a painless test that uses sound waves to create images of your heart. It provides your doctor with information about the size and shape of your heart and how well your heart's chambers and valves are working.   You may receive an ultrasound enhancing agent through an IV if needed to better visualize your heart during the echo. This procedure takes approximately one hour.  There are no restrictions for this procedure.  [facility], [place], [place]  Follow-Up: At [facility] you and your health needs are our priority.  As part of our continuing mission to provide you with exceptional heart care, our providers are all part of one team.  This team includes your primary Cardiologist (physician) and Advanced Practice Providers or APPs (Physician Assistants and Nurse Practitioners) who all work together to provide you with the care you need, when you need it.  Your next appointment:   6 month(s)  Provider:   Kardie Tobb, DO

## 2024-04-27 NOTE — Anesthesia Preprocedure Evaluation (Signed)
 Anesthesia Evaluation  Patient identified by MRN, date of birth, ID band Patient awake    Reviewed: Allergy & Precautions, NPO status , Patient's Chart, lab work & pertinent test results  History of Anesthesia Complications Negative for: history of anesthetic complications  Airway Mallampati: III  TM Distance: >3 FB Neck ROM: Full  Mouth opening: Limited Mouth Opening  Dental  (+) Teeth Intact, Dental Advisory Given, Chipped   Pulmonary sleep apnea , former smoker   breath sounds clear to auscultation       Cardiovascular + Peripheral Vascular Disease  + Valvular Problems/Murmurs AS  Rhythm:Regular Rate:Normal     Neuro/Psych  Headaches PSYCHIATRIC DISORDERS Anxiety Depression Bipolar Disorder      GI/Hepatic Neg liver ROS,GERD  ,,  Endo/Other  diabetes, Type 2Hypothyroidism    Renal/GU Renal InsufficiencyRenal disease     Musculoskeletal  (+) Arthritis , Osteoarthritis,    Abdominal  (+) + obese  Peds  Hematology negative hematology ROS (+) Blood dyscrasia, anemia   Anesthesia Other Findings   Reproductive/Obstetrics                              Anesthesia Physical Anesthesia Plan  ASA: 3  Anesthesia Plan: General   Post-op Pain Management:    Induction: Intravenous  PONV Risk Score and Plan: 3 and Treatment may vary due to age or medical condition, Ondansetron , Dexamethasone  and Midazolam   Airway Management Planned: LMA  Additional Equipment: None  Intra-op Plan:   Post-operative Plan: Extubation in OR  Informed Consent: I have reviewed the patients History and Physical, chart, labs and discussed the procedure including the risks, benefits and alternatives for the proposed anesthesia with the patient or authorized representative who has indicated his/her understanding and acceptance.     Dental advisory given  Plan Discussed with: CRNA  Anesthesia Plan Comments: (See  PAT note from 7/3)         Anesthesia Quick Evaluation

## 2024-04-27 NOTE — H&P (Signed)
 Office Visit Report     04/17/2024   --------------------------------------------------------------------------------   Alexandra Morrison  MRN: [id number]  DOB: July 26, 1946, 78 year old Female  SSN:    PRIMARY CARE:     REFERRING:  Garnette BROCKS. [facility]), MD  PROVIDER:  Lonni Han, M.D.  LOCATION:  [facility] [phone]     --------------------------------------------------------------------------------   CC/HPI: Left ureteral lesion and hydronephrosis   [name] is a 78 year old female who was found to have left-sided hydronephrosis as well as a focal area of narrowing within the proximal ureter/UPJ accompanied by faint urothelial thickening of the collecting system concerning for an underlying lesion on CT abdomen/pelvis with contrast from 01/28/2024 during an evaluation for lower abdominal pain.   -Previously smoked 1 ppd for 23 years--quit 22 years ago  -Recently treated for a gastric ulcers  -She denies any recent episodes of flank pain or gross hematuria.  -No personal/family history of GU malignancies  -No prior GU surgery/trauma  -No history of kidney stones or recurrent UTIs     ALLERGIES: None   MEDICATIONS: Levothyroxine  Sodium  Metoprolol  Tartrate 25 MG Tablet  Abilify  Adderall 30 MG Tablet  ALPRAZolam   Calcium  Divalproex  Sodium 500 MG Tablet Delayed Release  Furosemide  20 MG Tablet  Iron  Lexapro  Lovastatin  Meclizine HCl 25 MG Tablet  Methocarbamol  500 MG Tablet  Multivitamin  Pantoprazole  Sodium 40 MG Tablet Delayed Release  Potassium Chloride   Vitamin D3  Wellbutrin  XL 300 MG Tablet Extended Release 24 Hour     GU PSH: No GU PSH      PSH Notes: 2 c sections, 2 knee replacements, broken arm, 2 shoulder surgeries, gastric bypass, cataracts   NON-GU PSH: No Non-GU PSH    GU PMH: None     PMH Notes: Digestive  GI bleed   Endocrine  Hypothyroidism   Musculoskeletal and Integument  Primary osteoarthritis of  right knee  Right knee DJD  Left knee DJD  DDD (degenerative disc disease), lumbar   Other  S/P cervical spinal fusion  Spinal stenosis  Symptomatic anemia  Macrocytosis  HLD (hyperlipidemia)  Anxiety  Encounter to establish care with new doctor  Murmur  Mixed hyperlipidemia      NON-GU PMH: Anxiety Arthritis Cardiac murmur, unspecified Depression Diabetes Type 2 GERD Sleep Apnea    FAMILY HISTORY: 1 Daughter - Daughter 1 son - Son Arthritis - Runs in Family COPD (chronic obstructive pulmonary disease) with - Brother Heart Disease - Runs in Family   SOCIAL HISTORY: Marital Status: Married Preferred Language: English; Race: White Current Smoking Status: Patient does not smoke anymore.   Tobacco Use Assessment Completed: Used Tobacco in last 30 days? Has never drank.  Drinks 3 caffeinated drinks per day.    REVIEW OF SYSTEMS:    GU Review Female:   Patient reports get up at night to urinate. Patient denies frequent urination, hard to postpone urination, burning /pain with urination, leakage of urine, stream starts and stops, trouble starting your stream, have to strain to urinate, and being pregnant.  Gastrointestinal (Upper):   Patient denies nausea, vomiting, and indigestion/ heartburn.  Gastrointestinal (Lower):   Patient denies diarrhea and constipation.  Constitutional:   Patient denies fever, night sweats, weight loss, and fatigue.  Skin:   Patient denies skin rash/ lesion and itching.  Eyes:   Patient denies double vision and blurred vision.  Ears/ Nose/ Throat:   Patient denies sore throat and sinus problems.  Hematologic/Lymphatic:  Patient denies swollen glands and easy bruising.  Cardiovascular:   Patient denies leg swelling and chest pains.  Respiratory:   Patient denies cough and shortness of breath.  Endocrine:   Patient denies excessive thirst.  Musculoskeletal:   Patient denies back pain and joint pain.  Neurological:   Patient denies headaches  and dizziness.  Psychologic:   Patient denies depression and anxiety.   VITAL SIGNS:      04/17/2024 10:58 AM  Weight 134 lb / 60.78 kg  Height 58 in / 147.32 cm  BP 95/59 mmHg  Pulse 91 /min  BMI 28.0 kg/m   MULTI-SYSTEM PHYSICAL EXAMINATION:    Constitutional: Well-nourished. No physical deformities. Normally developed. Good grooming.  Neurologic / Psychiatric: Oriented to time, oriented to place, oriented to person. No depression, no anxiety, no agitation.     Complexity of Data:  Lab Test Review:   BMP  Records Review:   Previous Patient Records  X-Ray Review: C.T. Abdomen/Pelvis: Reviewed Films. Reviewed Report. Discussed With Patient.     PROCEDURES:          Urinalysis Dipstick Dipstick Cont'd  Color: Yellow Bilirubin: Neg mg/dL  Appearance: Clear Ketones: Neg mg/dL  Specific Gravity: 8.984 Blood: Neg ery/uL  pH: 6.0 Protein: Trace mg/dL  Glucose: Neg mg/dL Urobilinogen: 0.2 mg/dL    Nitrites: Neg    Leukocyte Esterase: Neg leu/uL    ASSESSMENT:      ICD-10 Details  1 GU:   Hydronephrosis - N13.0 Left, Undiagnosed New Problem   PLAN:           Schedule Return Visit/Planned Activity: Next Available Appointment - Schedule Surgery          Document Letter(s):  Created for Patient: Clinical Summary         Notes:   - CT images discussed with the patient and her husband. Noted lesions are concerning for urothelial cell carcinoma. The risk, benefits and alternatives of cystoscopy with left ureteroscopy, possible ureteral/renal pelvis biopsy with fulguration and ureteral stent placement was discussed in detail. Risks include, but are not limited to, bleeding complications, infection, ureteral stricture, the need for prolonged stent implantation, MI, CVA, PE, DVT and the inherent risk of general anesthesia. She voices understanding and wished to proceed.

## 2024-04-27 NOTE — Progress Notes (Addendum)
 Case: [id number] Date/Time: 04/28/24 0830   Procedures:      CYSTOSCOPY, WITH BIOPSY (Left) - CYSTOSCOPY WITH LEFT URETEROSCOPY, POSSIBLE BIOPSY, POSSIBLE STENT PLACEMENT     CYSTOSCOPY, WITH STENT INSERTION (Left)   Anesthesia type: General   Diagnosis: Acquired hydronephrosis with ureteropelvic junction (UPJ) obstruction [N13.0]   Pre-op diagnosis: LEFT HYDRONEPHROSIS   Location: [facility] ROOM 01 / [facility]   Surgeons: Devere Lonni Righter, MD       DISCUSSION: Alexandra Morrison is a 78 yo female who presents to PAT prior to surgery above. PMH of former smoking, aortic atherosclerosis, mild aortic stenosis, SVT, OSA (no CPAP; resolved after weight loss), GERD, CKD, bipolar d/o, anxiety, depression, arthritis.  Patient followed by Cardiology for hx of palpitations and mild AS. Also with shortness of breath. Prior w/u in 09/2022 includes zio patch which showed runs of SVT. Echo in 01/2023 showed normal LVEF and mild AS. And stress testing in 01/2023 which was low risk. Last seen on 04/27/24 for pre op clearance. She reported doing well with no significant symptoms. Cleared for surgery:  Preoperative cardiovascular risk assessment Cystoscopy with left ureteroscopy possible biopsy and possible stent placement by Dr. Devere on 04/28/2024.  According to the RCRI, patient has a 0.4% risk of MACE. Patient reports activity equivalent to 4.0 METS (performs light to moderate household activities).  -Based on ACC/AHA guidelines, Alexandra Morrison would be at acceptable risk for the planned procedure without further cardiovascular testing.  Pt with hx of DM. Resolved after weight loss  VS: BP 133/67   Pulse 80   Temp 36.8 C (Oral)   Resp 18   Ht 4' 8 (1.422 m)   Wt 61.4 kg   LMP  (LMP Unknown)   SpO2 98%   BMI 30.36 kg/m   PROVIDERS: Nanci Senior, MD   LABS: Labs reviewed: Acceptable for surgery. (all labs ordered are listed, but only abnormal results are displayed)  Labs Reviewed  BASIC  METABOLIC PANEL WITH GFR - Abnormal; Notable for the following components:      Result Value   Glucose, Bld 105 (*)    Creatinine, Ser 1.28 (*)    Calcium 8.6 (*)    GFR, Estimated 43 (*)    All other components within normal limits  CBC - Abnormal; Notable for the following components:   RBC 3.82 (*)    Hemoglobin 11.8 (*)    All other components within normal limits  GLUCOSE, CAPILLARY - Abnormal; Notable for the following components:   Glucose-Capillary 108 (*)    All other components within normal limits  HEMOGLOBIN A1C     IMAGES:  CXR 02/05/24:  FINDINGS: Cardiac shadow is stable. Aortic calcifications are noted. Postsurgical changes in the cervical spine and shoulder joints are noted. No focal infiltrate is seen. Stable scoliosis of the thoracic spine is noted concave to the left.   IMPRESSION: No acute abnormality noted.   EKG 04/27/24:  Normal sinus rhythm, rate 82 Low voltage QRS  CV: Stress test 02/15/23:    The study is normal. The study is low risk.   No ST deviation was noted.   LV perfusion is normal. There is no evidence of ischemia.   Left ventricular function is normal. End diastolic cavity size is normal.   Prior study not available for comparison.   Normal resting and stress perfusion. No ischemia or infarction EF 56%  Echo 02/12/2023:   IMPRESSIONS    1. Left ventricular ejection fraction, by estimation, is  60 to 65%. The left ventricle has normal function. The left ventricle has no regional wall motion abnormalities. Left ventricular diastolic parameters are indeterminate.  2. Right ventricular systolic function is normal. The right ventricular size is normal. There is normal pulmonary artery systolic pressure.  3. Left atrial size was mildly dilated.  4. AT HR of 72 bpm MAV 2.1 cm2 with mean gradient 6 peak 13 mmHg . The mitral valve is abnormal. Trivial mitral valve regurgitation. Mild mitral stenosis. Severe mitral annular  calcification.  5. Gradient lower than prior echo Mild AS by gradients more moderate based on AVA and DVI . The aortic valve was not well visualized. There is moderate calcification of the aortic valve. There is moderate thickening of the aortic valve. Aortic valve regurgitation is not visualized. Mild aortic valve stenosis.  6. The inferior vena cava is normal in size with greater than 50% respiratory variability, suggesting right atrial pressure of 3 mmHg.  Cardiac monitoring 09/27/2022:  Patch Wear Time:  13 days and 22 hours (2023-11-16T10:28:08-499 to 2023-11-30T09:14:58-0500)   Patient had a min HR of 57 bpm, max HR of 200 bpm, and avg HR of 86 bpm.    Predominant underlying rhythm was Sinus Rhythm.    16 Supraventricular Tachycardia runs occurred, the run with the fastest interval lasting 6 beats with a max rate of 200 bpm, the longest lasting 15.4 secs with an avg rate of 104 bpm.  Some episodes of Supraventricular Tachycardia may be possible Atrial Tachycardia with variable block. Isolated SVEs were rare (<1.0%), SVE Couplets were rare (<1.0%), and SVE Triplets were rare (<1.0%). Isolated VEs were rare (<1.0%), VE Couplets were rare (<1.0%), and no VE Triplets were present.   Symptoms associated with Sinus Rhythm.   Conclusion: The study shows evidence of paroxysmal atrial tachycardia. Past Medical History:  Diagnosis Date   Abdominal aortic atherosclerosis (HCC)    Moderate   Anemia    Anxiety    Aortic stenosis, mild    Arthritis    Bipolar disorder (HCC)    type 2 hypomanic   Complication of anesthesia    pt request no spinal anesthesia only general   DDD (degenerative disc disease), lumbar    Depression    Diabetes mellitus without complication (HCC)    GERD (gastroesophageal reflux disease)    Headache    High cholesterol    History of diabetes mellitus    resolved after weight loss   History of mumps as a child    History of sleep apnea    most recent study  was negative for sleep apnea, resolved after weight loss    Hydronephrosis    Hypothyroidism    Myelopathy (HCC)    Numbness and tingling in hands    Osteopenia    Osteoporosis    Right arm fracture 1953   x2   Scoliosis    per pt   Sleep apnea    Spinal stenosis    Moderate to severe   Vertigo    Wears glasses     Past Surgical History:  Procedure Laterality Date   APPENDECTOMY     CARPAL TUNNEL RELEASE Bilateral    CESAREAN SECTION  1971, 1973   x2   COLONOSCOPY WITH PROPOFOL  N/A 09/14/2019   Procedure: COLONOSCOPY WITH PROPOFOL ;  Surgeon: Dianna Specking, MD;  Location: [facility] ENDOSCOPY;  Service: Endoscopy;  Laterality: N/A;   ESOPHAGOGASTRODUODENOSCOPY (EGD) WITH PROPOFOL  N/A 09/14/2019   Procedure: ESOPHAGOGASTRODUODENOSCOPY (EGD) WITH PROPOFOL ;  Surgeon: Dianna Specking,  MD;  Location: [facility] ENDOSCOPY;  Service: Endoscopy;  Laterality: N/A;   EYE SURGERY Bilateral 2006   cataracts   FRACTURE SURGERY     Arm   GASTRIC BYPASS  2006   POSTERIOR CERVICAL FUSION/FORAMINOTOMY N/A 11/25/2015   Procedure: Posterior Cervical Fusion with lateral mass fixation Cervical fiv-Thoracic one, cervical laminectomy  - Cervical six-Cervical seven - Cervical seven-Thoracic one;  Surgeon: Alm GORMAN Molt, MD;  Location: [facility] NEURO [facility]S;  Service: Neurosurgery;  Laterality: N/A;   TOTAL KNEE ARTHROPLASTY Right 07/11/2017   Procedure: RIGHT TOTAL KNEE ARTHROPLASTY;  Surgeon: Duwayne Purchase, MD;  Location: [facility];  Service: Orthopedics;  Laterality: Right;  150 mins   TOTAL KNEE ARTHROPLASTY Left 03/12/2018   Procedure: LEFT TOTAL KNEE ARTHROPLASTY;  Surgeon: Duwayne Purchase, MD;  Location: [facility];  Service: Orthopedics;  Laterality: Left;  Adductor Block   TOTAL SHOULDER ARTHROPLASTY Bilateral 2010, 2011    MEDICATIONS:  ABILIFY 10 MG tablet   acetaminophen  (TYLENOL ) 500 MG tablet   ALPRAZolam  (XANAX ) 0.5 MG tablet   amoxicillin -clavulanate (AUGMENTIN ) 875-125 MG tablet    amphetamine -dextroamphetamine  (ADDERALL) 30 MG tablet   b complex vitamins capsule   buPROPion  (WELLBUTRIN  XL) 300 MG 24 hr tablet   calcium carbonate (OS-CAL) 600 MG tablet   cholecalciferol (VITAMIN D3) 25 MCG (1000 UNIT) tablet   Chromium-Cinnamon (CINNAMON PLUS CHROMIUM) [id number] MCG-MG CAPS   Coenzyme Q10 (COQ-10) 400 MG CAPS   cyanocobalamin  (VITAMIN B12) 1000 MCG tablet   diphenhydrAMINE  (BENADRYL ) 25 MG tablet   divalproex  (DEPAKOTE  ER) 500 MG 24 hr tablet   ferrous sulfate  325 (65 FE) MG tablet   levothyroxine  (SYNTHROID , LEVOTHROID) 50 MCG tablet   LEXAPRO 20 MG tablet   lovastatin (MEVACOR) 10 MG tablet   magnesium  gluconate (MAGONATE) 500 (27 Mg) MG TABS tablet   meclizine (ANTIVERT) 25 MG tablet   methocarbamol  (ROBAXIN ) 500 MG tablet   metoprolol  succinate (TOPROL  XL) 25 MG 24 hr tablet   Multiple Vitamins-Minerals (MULTIVITAMIN WITH MINERALS) tablet   niacin  (VITAMIN B3) 500 MG tablet   Omega-3 Fatty Acids (FISH OIL) 1200 MG CAPS   pantoprazole  (PROTONIX ) 40 MG tablet   Potassium 99 MG TABS   Specialty Vitamins Products (BIOTIN PLUS KERATIN PO)   traZODone  (DESYREL ) 100 MG tablet   Turmeric (QC TUMERIC COMPLEX PO)   No current facility-administered medications for this encounter.   Burnard CHRISTELLA Odis DEVONNA [facility] Short Stay/Anesthesiology [facility] Phone [phone] 04/27/2024 3:50 PM

## 2024-04-28 ENCOUNTER — Ambulatory Visit (HOSPITAL_COMMUNITY)

## 2024-04-28 ENCOUNTER — Encounter (HOSPITAL_COMMUNITY): Payer: Self-pay | Admitting: Medical

## 2024-04-28 ENCOUNTER — Other Ambulatory Visit: Payer: Self-pay

## 2024-04-28 ENCOUNTER — Encounter (HOSPITAL_COMMUNITY)
Admission: RE | Disposition: A | Discharge status: Home / Self Care | Payer: Self-pay | Source: Home / Self Care | Attending: Urology

## 2024-04-28 ENCOUNTER — Encounter (HOSPITAL_COMMUNITY): Payer: Self-pay | Admitting: Urology

## 2024-04-28 ENCOUNTER — Ambulatory Visit (HOSPITAL_COMMUNITY): Admitting: Anesthesiology

## 2024-04-28 ENCOUNTER — Ambulatory Visit (HOSPITAL_COMMUNITY)
Admission: RE | Admit: 2024-04-28 | Discharge: 2024-04-28 | Disposition: A | Discharge status: Home / Self Care | Attending: Urology | Admitting: Urology

## 2024-04-28 DIAGNOSIS — N1330 Unspecified hydronephrosis: Secondary | ICD-10-CM

## 2024-04-28 DIAGNOSIS — K219 Gastro-esophageal reflux disease without esophagitis: Secondary | ICD-10-CM | POA: Insufficient documentation

## 2024-04-28 DIAGNOSIS — E1151 Type 2 diabetes mellitus with diabetic peripheral angiopathy without gangrene: Secondary | ICD-10-CM | POA: Diagnosis not present

## 2024-04-28 DIAGNOSIS — C679 Malignant neoplasm of bladder, unspecified: Secondary | ICD-10-CM | POA: Insufficient documentation

## 2024-04-28 DIAGNOSIS — G4730 Sleep apnea, unspecified: Secondary | ICD-10-CM | POA: Diagnosis not present

## 2024-04-28 DIAGNOSIS — N3289 Other specified disorders of bladder: Secondary | ICD-10-CM | POA: Diagnosis not present

## 2024-04-28 DIAGNOSIS — Z87891 Personal history of nicotine dependence: Secondary | ICD-10-CM | POA: Insufficient documentation

## 2024-04-28 DIAGNOSIS — N130 Hydronephrosis with ureteropelvic junction obstruction: Secondary | ICD-10-CM | POA: Diagnosis present

## 2024-04-28 DIAGNOSIS — E119 Type 2 diabetes mellitus without complications: Secondary | ICD-10-CM | POA: Diagnosis not present

## 2024-04-28 DIAGNOSIS — I350 Nonrheumatic aortic (valve) stenosis: Secondary | ICD-10-CM | POA: Diagnosis not present

## 2024-04-28 DIAGNOSIS — E039 Hypothyroidism, unspecified: Secondary | ICD-10-CM | POA: Diagnosis not present

## 2024-04-28 DIAGNOSIS — E785 Hyperlipidemia, unspecified: Secondary | ICD-10-CM | POA: Diagnosis not present

## 2024-04-28 DIAGNOSIS — C652 Malignant neoplasm of left renal pelvis: Secondary | ICD-10-CM | POA: Diagnosis not present

## 2024-04-28 DIAGNOSIS — D49512 Neoplasm of unspecified behavior of left kidney: Secondary | ICD-10-CM | POA: Diagnosis not present

## 2024-04-28 HISTORY — PX: CYSTOSCOPY W/ URETERAL STENT PLACEMENT: SHX1429

## 2024-04-28 HISTORY — PX: CYSTOSCOPY WITH STENT PLACEMENT: SHX5790

## 2024-04-28 LAB — GLUCOSE, CAPILLARY
Glucose-Capillary: 102 mg/dL — ABNORMAL HIGH (ref 70–99)
Glucose-Capillary: 95 mg/dL (ref 70–99)

## 2024-04-28 SURGERY — CYSTOSCOPY, WITH STENT INSERTION
Anesthesia: General | Laterality: Left

## 2024-04-28 MED ORDER — FENTANYL CITRATE (PF) 100 MCG/2ML IJ SOLN
INTRAMUSCULAR | Status: DC | PRN
  Administered 2024-04-28: 50 ug via INTRAVENOUS
  Administered 2024-04-28 (×2): 25 ug via INTRAVENOUS

## 2024-04-28 MED ORDER — IOHEXOL 300 MG/ML  SOLN
INTRAMUSCULAR | Status: DC | PRN
  Administered 2024-04-28: 25 mL

## 2024-04-28 MED ORDER — CEFAZOLIN SODIUM-DEXTROSE 2-4 GM/100ML-% IV SOLN
2.0000 g | INTRAVENOUS | Status: AC
  Administered 2024-04-28: 2 g via INTRAVENOUS
  Filled 2024-04-28: qty 100

## 2024-04-28 MED ORDER — PHENAZOPYRIDINE HCL 200 MG PO TABS: 200.0000 mg | ORAL_TABLET | Freq: Three times a day (TID) | ORAL | 0 refills | Status: DC | PRN

## 2024-04-28 MED ORDER — LIDOCAINE HCL (PF) 2 % IJ SOLN
INTRAMUSCULAR | Status: AC
  Filled 2024-04-28: qty 5

## 2024-04-28 MED ORDER — OXYCODONE HCL 5 MG PO TABS: 5.0000 mg | ORAL_TABLET | Freq: Once | ORAL | Status: DC | PRN

## 2024-04-28 MED ORDER — CHLORHEXIDINE GLUCONATE 0.12 % MT SOLN
15.0000 mL | Freq: Once | OROMUCOSAL | Status: AC
  Administered 2024-04-28: 15 mL via OROMUCOSAL

## 2024-04-28 MED ORDER — AMISULPRIDE (ANTIEMETIC) 5 MG/2ML IV SOLN: 10.0000 mg | Freq: Once | INTRAVENOUS | Status: DC | PRN

## 2024-04-28 MED ORDER — PROPOFOL 10 MG/ML IV BOLUS
INTRAVENOUS | Status: DC | PRN
  Administered 2024-04-28: 30 mg via INTRAVENOUS
  Administered 2024-04-28: 50 mg via INTRAVENOUS
  Administered 2024-04-28: 120 mg via INTRAVENOUS

## 2024-04-28 MED ORDER — PHENYLEPHRINE 80 MCG/ML (10ML) SYRINGE FOR IV PUSH (FOR BLOOD PRESSURE SUPPORT)
PREFILLED_SYRINGE | INTRAVENOUS | Status: DC | PRN
  Administered 2024-04-28: 80 ug via INTRAVENOUS

## 2024-04-28 MED ORDER — PROPOFOL 10 MG/ML IV BOLUS
INTRAVENOUS | Status: AC
  Filled 2024-04-28: qty 20

## 2024-04-28 MED ORDER — DEXAMETHASONE SODIUM PHOSPHATE 4 MG/ML IJ SOLN: INTRAMUSCULAR | Status: DC | PRN

## 2024-04-28 MED ORDER — ONDANSETRON HCL 4 MG PO TABS: 4.0000 mg | ORAL_TABLET | Freq: Every day | ORAL | 1 refills | Status: DC | PRN

## 2024-04-28 MED ORDER — OXYCODONE HCL 5 MG/5ML PO SOLN: 5.0000 mg | Freq: Once | ORAL | Status: DC | PRN

## 2024-04-28 MED ORDER — DEXAMETHASONE SODIUM PHOSPHATE 10 MG/ML IJ SOLN
INTRAMUSCULAR | Status: DC | PRN
  Administered 2024-04-28: 4 mg via INTRAVENOUS

## 2024-04-28 MED ORDER — OXYBUTYNIN CHLORIDE 5 MG PO TABS: 5.0000 mg | ORAL_TABLET | Freq: Three times a day (TID) | ORAL | 1 refills | Status: DC | PRN

## 2024-04-28 MED ORDER — LIDOCAINE HCL (PF) 2 % IJ SOLN
INTRAMUSCULAR | Status: DC | PRN
Start: 2024-04-28 — End: 2024-04-28
  Administered 2024-04-28: 50 mg via INTRADERMAL

## 2024-04-28 MED ORDER — SODIUM CHLORIDE 0.9 % IV SOLN: 12.5000 mg | INTRAVENOUS | Status: DC | PRN

## 2024-04-28 MED ORDER — INSULIN ASPART 100 UNIT/ML IJ SOLN: 0.0000 [IU] | INTRAMUSCULAR | Status: DC | PRN

## 2024-04-28 MED ORDER — LACTATED RINGERS IV SOLN: INTRAVENOUS | Status: DC | PRN

## 2024-04-28 MED ORDER — LACTATED RINGERS IV SOLN
INTRAVENOUS | Status: DC
Start: 2024-04-28 — End: 2024-04-28

## 2024-04-28 MED ORDER — DEXAMETHASONE SODIUM PHOSPHATE 10 MG/ML IJ SOLN
INTRAMUSCULAR | Status: AC
  Filled 2024-04-28: qty 1

## 2024-04-28 MED ORDER — FENTANYL CITRATE (PF) 100 MCG/2ML IJ SOLN
INTRAMUSCULAR | Status: AC
Start: 2024-04-28 — End: 2024-04-28
  Filled 2024-04-28: qty 2

## 2024-04-28 MED ORDER — CEPHALEXIN 500 MG PO CAPS
500.0000 mg | ORAL_CAPSULE | Freq: Two times a day (BID) | ORAL | 0 refills | Status: AC
Start: 2024-04-28 — End: 2024-05-01

## 2024-04-28 MED ORDER — OXYCODONE-ACETAMINOPHEN 5-325 MG PO TABS: 1.0000 | ORAL_TABLET | ORAL | 0 refills | Status: DC | PRN

## 2024-04-28 MED ORDER — 0.9 % SODIUM CHLORIDE (POUR BTL) OPTIME
TOPICAL | Status: DC | PRN
Start: 2024-04-28 — End: 2024-04-28
  Administered 2024-04-28: 1000 mL

## 2024-04-28 MED ORDER — ORAL CARE MOUTH RINSE
15.0000 mL | Freq: Once | OROMUCOSAL | Status: AC
Start: 2024-04-28 — End: 2024-04-28

## 2024-04-28 MED ORDER — ONDANSETRON HCL 4 MG/2ML IJ SOLN
INTRAMUSCULAR | Status: AC
  Filled 2024-04-28: qty 2

## 2024-04-28 MED ORDER — SODIUM CHLORIDE 0.9 % IR SOLN
Status: DC | PRN
  Administered 2024-04-28: 3000 mL

## 2024-04-28 MED ORDER — ONDANSETRON HCL 4 MG/2ML IJ SOLN
INTRAMUSCULAR | Status: DC | PRN
  Administered 2024-04-28: 4 mg via INTRAVENOUS

## 2024-04-28 MED ORDER — PHENYLEPHRINE 80 MCG/ML (10ML) SYRINGE FOR IV PUSH (FOR BLOOD PRESSURE SUPPORT)
PREFILLED_SYRINGE | INTRAVENOUS | Status: AC
  Filled 2024-04-28: qty 10

## 2024-04-28 MED ORDER — HYDROMORPHONE HCL 1 MG/ML IJ SOLN: 0.2500 mg | INTRAMUSCULAR | Status: DC | PRN

## 2024-04-28 SURGICAL SUPPLY — 20 items
BAG URINE DRAIN 2000ML AR STRL (UROLOGICAL SUPPLIES) IMPLANT
BAG URO CATCHER STRL LF (MISCELLANEOUS) ×1 IMPLANT
CATH URETERAL DUAL LUMEN 10F (MISCELLANEOUS) IMPLANT
CATH URETL OPEN 5X70 (CATHETERS) ×1 IMPLANT
CLOTH BEACON ORANGE TIMEOUT ST (SAFETY) ×1 IMPLANT
DRAPE FOOT SWITCH (DRAPES) ×1 IMPLANT
ELECT REM PT RETURN 15FT ADLT (MISCELLANEOUS) ×1 IMPLANT
GLOVE SURG LX STRL 8.0 MICRO (GLOVE) ×1 IMPLANT
GOWN STRL SURGICAL XL XLNG (GOWN DISPOSABLE) ×1 IMPLANT
GUIDEWIRE STR DUAL SENSOR (WIRE) IMPLANT
GUIDEWIRE ZIPWRE .038 STRAIGHT (WIRE) ×1 IMPLANT
KIT BALLIN UROMAX 15FX10 (LABEL) IMPLANT
KIT TURNOVER KIT A (KITS) ×1 IMPLANT
LOOP CUT BIPOLAR 24F LRG (ELECTROSURGICAL) IMPLANT
MANIFOLD NEPTUNE II (INSTRUMENTS) ×1 IMPLANT
PACK CYSTO (CUSTOM PROCEDURE TRAY) ×1 IMPLANT
STENT URET 6FRX24 CONTOUR (STENTS) IMPLANT
SYRINGE TOOMEY IRRIG 70ML (MISCELLANEOUS) IMPLANT
TUBING CONNECTING 10 (TUBING) ×1 IMPLANT
TUBING UROLOGY SET (TUBING) ×1 IMPLANT

## 2024-04-28 NOTE — Transfer of Care (Signed)
 Immediate Anesthesia Transfer of Care Note  Patient: Alexandra Morrison  Procedure(s) Performed: CYSTOSCOPY, WITH STENT INSERTION (Left)  Patient Location: PACU  Anesthesia Type:General  Level of Consciousness: drowsy  Airway & Oxygen Therapy: Patient Spontanous Breathing and Patient connected to face mask oxygen  Post-op Assessment: Report given to RN and Post -op Vital signs reviewed and stable  Post vital signs: Reviewed and stable  Last Vitals:  Vitals Value Taken Time  BP 144/91 04/28/24 10:00  Temp    Pulse 81 04/28/24 10:03  Resp 11 04/28/24 10:03  SpO2 99 % 04/28/24 10:03  Vitals shown include unfiled device data.  Last Pain:  Vitals:   04/28/24 0712  TempSrc:   PainSc: 0-No pain      Patients Stated Pain Goal: 5 (04/28/24 9287)  Complications: No notable events documented.

## 2024-04-28 NOTE — Anesthesia Procedure Notes (Addendum)
 Procedure Name: LMA Insertion Date/Time: 04/28/2024 8:53 AM  Performed by: Deeann Eva BROCKS, CRNAPre-anesthesia Checklist: Patient identified, Emergency Drugs available, Suction available and Patient being monitored Patient Re-evaluated:Patient Re-evaluated prior to induction Oxygen Delivery Method: Circle System Utilized Preoxygenation: Pre-oxygenation with 100% oxygen Induction Type: IV induction Ventilation: Mask ventilation without difficulty LMA: LMA inserted LMA Size: 4.0 Number of attempts: 1 Placement Confirmation: positive ETCO2 Tube secured with: Tape Dental Injury: Teeth and Oropharynx as per pre-operative assessment

## 2024-04-28 NOTE — Op Note (Signed)
 Operative Note  Preoperative diagnosis:  1.  Left renal pelvis mass  Postoperative diagnosis: 1.  3 cm papillary left renal pelvis mass with features concerning for urothelial cell carcinoma  Procedure(s): 1.  Cystoscopy with left ureteroscopy, left ureteral dilation and left ureteral stent placement 2.  Left retrograde pyelogram with intraoperative interpretation fluoroscopic imaging  Surgeon: Lonni Han, MD  Assistants:  None  Anesthesia:  General  Complications:  None  EBL: 10 mL  Specimens: 1.  Left renal pelvis washings  Drains/Catheters: 1.  Left 6 French, 24 cm JJ stent without tether  Intraoperative findings:   No intravesical or urethral abnormalities were seen during cystoscopy Left retrograde pyelogram revealed multiple areas of stenosis/stricture within the mid to proximal aspects of the left ureter.  There was also a filling defect within the midportion of the left renal pelvis. Severely stenotic left mid ureter that required balloon dilation to advance the flexible ureteroscope 3 cm papillary bladder tumor within the left renal pelvis with features concerning for urothelial cell carcinoma  Indication:  Alexandra Morrison is a 78 y.o. female with a left renal pelvis mass seen on recent cross-sectional imaging with concomitant hydronephrosis.  She has been consented for the above procedures, voices understanding and wishes to proceed.  Description of procedure:  After informed consent was obtained, the patient was brought to the operating room and general LMA anesthesia was administered. The patient was then placed in the dorsolithotomy position and prepped and draped in the usual sterile fashion. A timeout was performed. A 21 French rigid cystoscope was then inserted into the urethral meatus and advanced into the bladder under direct vision. A complete bladder survey revealed no intravesical pathology.  A 5 French ureteral catheter was then inserted into the  left ureteral orifice and a retrograde pyelogram was obtained, with the findings listed above.  A Glidewire was then used to intubate the lumen of the ureteral catheter and was advanced up to the left renal pelvis, under fluoroscopic guidance.  The catheter was then removed, leaving the wire in place.  I attempted to advance a single-lumen flexible ureteroscope over the Glidewire, but met resistance within the midportion of the left ureter.  I attempted to advance a 45 French dual-lumen catheter over the Glidewire, but was unable to advance it beyond the mid ureter.  A sensor wire was then advanced through the dual-lumen catheter and up to the left renal pelvis, or fluoroscopic guidance.  A Glidewire was then removed.  A 15 French, 10 cm balloon dilator was then advanced over the sensor wire and the left ureter was progressively dilated starting in the midportion and progressing up to the left UPJ, under fluoroscopic guidance.  I was then able to advance the flexible ureteroscope over the sensor wire and up to the left renal pelvis where a full inspection revealed a 3 cm papillary bladder tumor with features concerning for urothelial cell carcinoma.  I obtained left renal pelvis washings and sent the specimen for cytology.  After multiple photographs were taken, the flexible ureteroscope was then removed under direct vision, identifying multiple areas of stricture within the proximal and midportion of the left ureter, but no mucosal abnormalities/tumors.  A 6 French, 24 cm JJ stent was then advanced over the sensor wire and into good position within the left collecting system, confirming placement via fluoroscopy.  The patient's bladder was drained.  She tolerated the procedure well and was transferred to the postanesthesia in stable condition.  Plan: Discharge home.  Follow-up on 05/04/2024 to discuss left nephroureterectomy

## 2024-04-29 ENCOUNTER — Encounter (HOSPITAL_COMMUNITY): Payer: Self-pay | Admitting: Urology

## 2024-04-29 NOTE — Anesthesia Postprocedure Evaluation (Signed)
 Anesthesia Post Note  Patient: NYEEMAH JENNETTE  Procedure(s) Performed: CYSTOSCOPY, LEFT URETEROSCOPY,  LEFT URETERAL DILATION WITH LEFT STENT INSERTION (Left) CYSTOSCOPY, URETEROSCOPY WITH RETROGRADE PYELOGRAM AND URETERAL STENT INSERTION (Left)     Patient location during evaluation: PACU Anesthesia Type: General Level of consciousness: awake and alert Pain management: pain level controlled Vital Signs Assessment: post-procedure vital signs reviewed and stable Respiratory status: spontaneous breathing, nonlabored ventilation and respiratory function stable Cardiovascular status: blood pressure returned to baseline and stable Postop Assessment: no apparent nausea or vomiting Anesthetic complications: no   No notable events documented.  Last Vitals:  Vitals:   04/28/24 1044 04/28/24 1100  BP: 134/71 (!) 149/69  Pulse: 77 79  Resp: 14 12  Temp:  (!) 36 C  SpO2: 91% 93%    Last Pain:  Vitals:   04/28/24 1100  TempSrc:   PainSc: 0-No pain                 Butler Levander Pinal

## 2024-05-04 DIAGNOSIS — C652 Malignant neoplasm of left renal pelvis: Secondary | ICD-10-CM | POA: Diagnosis not present

## 2024-05-04 DIAGNOSIS — N130 Hydronephrosis with ureteropelvic junction obstruction: Secondary | ICD-10-CM | POA: Diagnosis not present

## 2024-05-05 ENCOUNTER — Telehealth: Payer: Self-pay | Admitting: Cardiology

## 2024-05-05 ENCOUNTER — Other Ambulatory Visit: Payer: Self-pay | Admitting: Urology

## 2024-05-05 LAB — CYTOLOGY - NON PAP

## 2024-05-05 NOTE — Telephone Encounter (Signed)
   Pre-operative Risk Assessment    Patient Name: Alexandra Morrison  DOB: December 23, 1945 MRN: [id number]   Date of last office visit: 04/27/24 Date of next office visit: 05/20/24   Request for Surgical Clearance    Procedure:  Robotic Left Nephroureterectomy, Cystoscopy with Transurethral Resection of the Left Ureteral Orifice   Date of Surgery:  Clearance 06/17/24                                Surgeon:  Dr. Lonni Han Surgeon's Group or Practice Name:  [facility] Phone number:  [phone]  Fax number:  [phone]   Type of Clearance Requested:   - Medical    Type of Anesthesia:  General    Additional requests/questions:     SignedHerma KATHEE Blush   05/05/2024, 10:11 AM

## 2024-05-05 NOTE — Telephone Encounter (Signed)
   Name: Alexandra Morrison  DOB: 08-28-1946  MRN: [id number]  Primary Cardiologist: Kardie Tobb, DO  Chart reviewed as part of pre-operative protocol coverage. The patient has an upcoming visit scheduled with Dr. Sheena on 05/20/24 at which time clearance can be addressed in case there are any issues that would impact surgical recommendations.  Robotic left nephroureterectomy is not scheduled until 06/17/24 as below. I added preop FYI to appointment note so that provider is aware to address at time of outpatient visit.  Per office protocol the cardiology provider should forward their finalized clearance decision and recommendations regarding antiplatelet therapy to the requesting party below.    I will route this message as FYI to requesting party and remove this message from the preop box as separate preop APP input not needed at this time.   Please call with any questions.  [name] D Wylie Russon, NP  05/05/2024, 3:02 PM

## 2024-05-08 ENCOUNTER — Ambulatory Visit (HOSPITAL_COMMUNITY)
Admission: RE | Admit: 2024-05-08 | Discharge: 2024-05-08 | Disposition: A | Discharge status: Home / Self Care | Source: Ambulatory Visit | Attending: Cardiology | Admitting: Cardiology

## 2024-05-08 DIAGNOSIS — I080 Rheumatic disorders of both mitral and aortic valves: Secondary | ICD-10-CM | POA: Insufficient documentation

## 2024-05-08 DIAGNOSIS — I350 Nonrheumatic aortic (valve) stenosis: Secondary | ICD-10-CM | POA: Diagnosis not present

## 2024-05-08 LAB — ECHOCARDIOGRAM COMPLETE
AR max vel: 2.23 cm2
AV Area VTI: 1.53 cm2
AV Area mean vel: 1.52 cm2
AV Mean grad: 17 mmHg
AV Peak grad: 13.2 mmHg
Ao pk vel: 1.82 m/s
Area-P 1/2: 3.65 cm2
S' Lateral: 2.3 cm

## 2024-05-09 ENCOUNTER — Ambulatory Visit: Payer: Self-pay | Admitting: Student

## 2024-05-11 DIAGNOSIS — C652 Malignant neoplasm of left renal pelvis: Secondary | ICD-10-CM | POA: Diagnosis not present

## 2024-05-12 DIAGNOSIS — C652 Malignant neoplasm of left renal pelvis: Secondary | ICD-10-CM | POA: Diagnosis not present

## 2024-05-19 ENCOUNTER — Ambulatory Visit: Admitting: Cardiology

## 2024-05-20 ENCOUNTER — Encounter: Payer: Self-pay | Admitting: Cardiology

## 2024-05-20 ENCOUNTER — Ambulatory Visit: Attending: Cardiology | Admitting: Cardiology

## 2024-05-20 VITALS — BP 104/50 | HR 74 | Ht <= 58 in | Wt 138.2 lb

## 2024-05-20 DIAGNOSIS — I350 Nonrheumatic aortic (valve) stenosis: Secondary | ICD-10-CM | POA: Insufficient documentation

## 2024-05-20 DIAGNOSIS — I471 Supraventricular tachycardia, unspecified: Secondary | ICD-10-CM | POA: Insufficient documentation

## 2024-05-20 DIAGNOSIS — I050 Rheumatic mitral stenosis: Secondary | ICD-10-CM | POA: Diagnosis not present

## 2024-05-20 NOTE — Patient Instructions (Signed)

## 2024-05-20 NOTE — Progress Notes (Signed)
 Cardiology Office Note:    Date:  05/26/2024   ID:  Alexandra Morrison, DOB December 01, 1945, MRN [id number]  PCP:  Alexandra Senior, MD  Cardiologist:  Dub Huntsman, DO  Electrophysiologist:  None   Referring MD: No ref. provider found   I been feeling much better  History of Present Illness:    Alexandra Morrison is a 78 y.o. female with a hx of mild aortic stenosis, diabetes mellitus, hyperlipidemia, bipolar disorder and sleep apnea.  Since her last visit she tells me that she is experiencing worsening shortness of breath. The shortness of breath is now really concerning for her.   Since her last visit the zio monitor shower PSVT- toprol  xl 12.5 mg was started. She is doing well on the toprol  xl.  Since her last visit with me she has been diagnosed with kidney cancer  Past Medical History:  Diagnosis Date   Abdominal aortic atherosclerosis (HCC)    Moderate   Anemia    Anxiety    Aortic stenosis, mild    Arthritis    Bipolar disorder (HCC)    type 2 hypomanic   Complication of anesthesia    pt request no spinal anesthesia only general   DDD (degenerative disc disease), lumbar    Depression    Diabetes mellitus without complication (HCC)    GERD (gastroesophageal reflux disease)    Headache    High cholesterol    History of diabetes mellitus    resolved after weight loss   History of mumps as a child    History of sleep apnea    most recent study was negative for sleep apnea, resolved after weight loss    Hydronephrosis    Hypothyroidism    Myelopathy (HCC)    Numbness and tingling in hands    Osteopenia    Osteoporosis    Right arm fracture 1953   x2   Scoliosis    per pt   Sleep apnea    Spinal stenosis    Moderate to severe   Vertigo    Wears glasses     Past Surgical History:  Procedure Laterality Date   APPENDECTOMY     CARPAL TUNNEL RELEASE Bilateral    CESAREAN SECTION  1971, 1973   x2   COLONOSCOPY WITH PROPOFOL  N/A 09/14/2019   Procedure:  COLONOSCOPY WITH PROPOFOL ;  Surgeon: Alexandra Specking, MD;  Location: [facility] ENDOSCOPY;  Service: Endoscopy;  Laterality: N/A;   CYSTOSCOPY W/ URETERAL STENT PLACEMENT Left 04/28/2024   Procedure: CYSTOSCOPY, URETEROSCOPY WITH RETROGRADE PYELOGRAM AND URETERAL STENT INSERTION;  Surgeon: Alexandra Lonni Righter, MD;  Location: [facility] ORS;  Service: Urology;  Laterality: Left;   CYSTOSCOPY WITH STENT PLACEMENT Left 04/28/2024   Procedure: CYSTOSCOPY, LEFT URETEROSCOPY,  LEFT URETERAL DILATION WITH LEFT STENT INSERTION;  Surgeon: Alexandra Lonni Righter, MD;  Location: [facility];  Service: Urology;  Laterality: Left;   ESOPHAGOGASTRODUODENOSCOPY (EGD) WITH PROPOFOL  N/A 09/14/2019   Procedure: ESOPHAGOGASTRODUODENOSCOPY (EGD) WITH PROPOFOL ;  Surgeon: Alexandra Specking, MD;  Location: [facility] ENDOSCOPY;  Service: Endoscopy;  Laterality: N/A;   EYE SURGERY Bilateral 2006   cataracts   FRACTURE SURGERY     Arm   GASTRIC BYPASS  2006   POSTERIOR CERVICAL FUSION/FORAMINOTOMY N/A 11/25/2015   Procedure: Posterior Cervical Fusion with lateral mass fixation Cervical fiv-Thoracic one, cervical laminectomy  - Cervical six-Cervical seven - Cervical seven-Thoracic one;  Surgeon: Alexandra GORMAN Molt, MD;  Location: [facility]URO OR[facility];  Service: Neurosurgery;  Laterality: N/A;   TOTAL KNEE  ARTHROPLASTY Right 07/11/2017   Procedure: RIGHT TOTAL KNEE ARTHROPLASTY;  Surgeon: Alexandra Purchase, MD;  Location: [facility];  Service: Orthopedics;  Laterality: Right;  150 mins   TOTAL KNEE ARTHROPLASTY Left 03/12/2018   Procedure: LEFT TOTAL KNEE ARTHROPLASTY;  Surgeon: Alexandra Purchase, MD;  Location: [facility];  Service: Orthopedics;  Laterality: Left;  Adductor Block   TOTAL SHOULDER ARTHROPLASTY Bilateral 2010, 2011    Current Medications: Current Meds  Medication Sig   ABILIFY 10 MG tablet Take 10 mg by mouth daily.   acetaminophen  (TYLENOL ) 500 MG tablet Take 500 mg by mouth every 6 (six) hours as needed for moderate pain (pain score 4-6) or mild pain  (pain score 1-3).   ALPRAZolam  (XANAX ) 0.5 MG tablet Take 0.5-1 mg by mouth as needed for anxiety.   amphetamine -dextroamphetamine  (ADDERALL) 30 MG tablet Take 30 mg by mouth 2 (two) times daily with breakfast and lunch.    b complex vitamins capsule Take 1 capsule by mouth 3 (three) times a week.   buPROPion  (WELLBUTRIN  XL) 300 MG 24 hr tablet Take 300 mg by mouth daily.   calcium carbonate (OS-CAL) 600 MG tablet Take 600 mg by mouth in the morning, at noon, and at bedtime.   cholecalciferol (VITAMIN D3) 25 MCG (1000 UNIT) tablet Take 1,000 Units by mouth 3 (three) times a week.   Chromium-Cinnamon (CINNAMON PLUS CHROMIUM) [phone] MCG-MG CAPS Take 1,000 mg by mouth in the morning and at bedtime.   Coenzyme Q10 (COQ-10) 400 MG CAPS Take 400 mg by mouth daily.   cyanocobalamin  (VITAMIN B12) 1000 MCG tablet Take 1,000 mcg by mouth 3 (three) times a week.   diphenhydrAMINE  (BENADRYL ) 25 MG tablet Take 25 mg by mouth daily as needed for allergies.   divalproex  (DEPAKOTE  ER) 500 MG 24 hr tablet Take 1,000 mg by mouth daily with breakfast.    ferrous sulfate  325 (65 FE) MG tablet Take 325 mg by mouth 3 (three) times a week.   levothyroxine  (SYNTHROID , LEVOTHROID) 50 MCG tablet Take 50 mcg by mouth daily with breakfast.    LEXAPRO 20 MG tablet Take 20 mg by mouth daily.   lovastatin (MEVACOR) 10 MG tablet Take 10 mg by mouth every evening.   magnesium  gluconate (MAGONATE) 500 (27 Mg) MG TABS tablet Take 500 mg by mouth 3 (three) times a week.   meclizine (ANTIVERT) 25 MG tablet Take 25 mg by mouth as needed for nausea.   methocarbamol  (ROBAXIN ) 500 MG tablet Take 1 tablet (500 mg total) by mouth every 6 (six) hours as needed for muscle spasms.   metoprolol  succinate (TOPROL  XL) 25 MG 24 hr tablet Take 0.5 tablets (12.5 mg total) by mouth daily with supper.   Multiple Vitamins-Minerals (MULTIVITAMIN WITH MINERALS) tablet Take 1 tablet by mouth daily with lunch.    niacin  (VITAMIN B3) 500 MG tablet Take  500 mg by mouth 3 (three) times a week.   Omega-3 Fatty Acids (FISH OIL) 1200 MG CAPS Take 1,200 mg by mouth in the morning and at bedtime.   ondansetron  (ZOFRAN ) 4 MG tablet Take 1 tablet (4 mg total) by mouth daily as needed for nausea or vomiting.   oxybutynin  (DITROPAN ) 5 MG tablet Take 1 tablet (5 mg total) by mouth every 8 (eight) hours as needed for bladder spasms.   oxyCODONE -acetaminophen  (PERCOCET) 5-325 MG tablet Take 1 tablet by mouth every 4 (four) hours as needed for severe pain (pain score 7-10).   pantoprazole  (PROTONIX ) 40 MG tablet Take 1  tablet (40 mg total) by mouth 2 (two) times daily.   phenazopyridine  (PYRIDIUM ) 200 MG tablet Take 1 tablet (200 mg total) by mouth 3 (three) times daily as needed (for pain with urination).   Potassium 99 MG TABS Take 99 mg by mouth 3 (three) times a week.   Specialty Vitamins Products (BIOTIN PLUS KERATIN PO) Take 1 tablet by mouth daily.   traZODone  (DESYREL ) 100 MG tablet Take 100 mg by mouth at bedtime as needed for sleep.    Turmeric (QC TUMERIC COMPLEX PO) Take 1 capsule by mouth daily.     Allergies:   Propofol    Social History   Socioeconomic History   Marital status: Married    Spouse name: Not on file   Number of children: Not on file   Years of education: Not on file   Highest education level: Not on file  Occupational History   Not on file  Tobacco Use   Smoking status: Former    Current packs/day: 0.00    Types: Cigarettes    Quit date: 01/20/1993    Years since quitting: 31.3    Passive exposure: Never   Smokeless tobacco: Never  Vaping Use   Vaping status: Never Used  Substance and Sexual Activity   Alcohol use: Yes    Comment: rarely   Drug use: No   Sexual activity: Yes    Birth control/protection: Post-menopausal  Other Topics Concern   Not on file  Social History Narrative   Not on file   Social Drivers of Health   Financial Resource Strain: Not on file  Food Insecurity: Not on file   Transportation Needs: Not on file  Physical Activity: Not on file  Stress: Not on file  Social Connections: Not on file     Family History: The patient's family history includes Breast cancer in her maternal aunt.  ROS:   Review of Systems  Constitution: Negative for decreased appetite, fever and weight gain.  HENT: Negative for congestion, ear discharge, hoarse voice and sore throat.   Eyes: Negative for discharge, redness, vision loss in right eye and visual halos.  Cardiovascular: Report shortness of breath. Negative for chest pain, leg swelling, orthopnea and palpitations.  Respiratory: Negative for cough, hemoptysis, shortness of breath and snoring.   Endocrine: Negative for heat intolerance and polyphagia.  Hematologic/Lymphatic: Negative for bleeding problem. Does not bruise/bleed easily.  Skin: Negative for flushing, nail changes, rash and suspicious lesions.  Musculoskeletal: Negative for arthritis, joint pain, muscle cramps, myalgias, neck pain and stiffness.  Gastrointestinal: Negative for abdominal pain, bowel incontinence, diarrhea and excessive appetite.  Genitourinary: Negative for decreased libido, genital sores and incomplete emptying.  Neurological: Negative for brief paralysis, focal weakness, headaches and loss of balance.  Psychiatric/Behavioral: Negative for altered mental status, depression and suicidal ideas.  Allergic/Immunologic: Negative for HIV exposure and persistent infections.   EKGs/Labs/Other Studies Reviewed:    The following studies were reviewed today:   EKG:  Normal sinus rhythm, HR 67 bpm   Zio monitor 01/11/2023 Patch Wear Time:  13 days and 22 hours (2023-11-16T10:28:08-499 to 2023-11-30T09:14:58-0500)   Patient had a min HR of 57 bpm, max HR of 200 bpm, and avg HR of 86 bpm.    Predominant underlying rhythm was Sinus Rhythm.    16 Supraventricular Tachycardia runs occurred, the run with the fastest interval lasting 6 beats with a max  rate of 200 bpm, the longest lasting 15.4 secs with an avg rate of 104 bpm.  Some episodes of Supraventricular Tachycardia may be possible Atrial Tachycardia with variable block. Isolated SVEs were rare (<1.0%), SVE Couplets were rare (<1.0%), and SVE Triplets were rare (<1.0%). Isolated VEs were rare (<1.0%), VE Couplets were rare (<1.0%), and no VE Triplets were present.   Symptoms associated with Sinus Rhythm.   Conclusion: The study shows evidence of paroxysmal atrial tachycardia.   TTE 04/02/2022     ECHOCARDIOGRAM REPORT       Patient Name:   LEIANNA BARGA Cisse Date of Exam: 04/02/2022 Medical Rec #:  [id number]        Height:       59.0 in Accession #:    [id number]       Weight:       141.8 lb Date of Birth:  [birth year]       BSA:          1.594 m Patient Age:    75 years         BP:           118/64 mmHg Patient Gender: F                HR:           78 bpm. Exam Location:  [address]  Procedure: 2D Echo, Cardiac Doppler and Color Doppler  Indications:    R01.1 Murmur   History:        Patient has no prior history of Echocardiogram examinations.                 Signs/Symptoms:Murmur; Risk Factors:Dyslipidemia, Diabetes and                 Former Smoker.   Sonographer:    Elsie Bohr RDCS Referring Phys: [id number] Chanika Byland  IMPRESSIONS    1. The aortic valve is tricuspid. There is mild calcification of the aortic valve. There is mild thickening of the aortic valve. Aortic valve regurgitation is not visualized. Mild aortic valve stenosis. Aortic valve mean gradient measures 16.0 mmHg. Aortic valve Vmax measures 2.70 m/s.  2. Left ventricular ejection fraction, by estimation, is 60 to 65%. The left ventricle has normal function. The left ventricle has no regional wall motion abnormalities. There is mild asymmetric left ventricular hypertrophy of the basal-septal segment. Left ventricular diastolic parameters are consistent with Grade II diastolic dysfunction  (pseudonormalization).  3. Right ventricular systolic function is normal. The right ventricular size is normal. There is normal pulmonary artery systolic pressure. The estimated right ventricular systolic pressure is 30.0 mmHg.  4. Left atrial size was mildly dilated.  5. The mitral valve is degenerative. Mild mitral valve regurgitation. No evidence of mitral stenosis. Moderate mitral annular calcification.  6. The inferior vena cava is normal in size with greater than 50% respiratory variability, suggesting right atrial pressure of 3 mmHg.  FINDINGS  Left Ventricle: Left ventricular ejection fraction, by estimation, is 60 to 65%. The left ventricle has normal function. The left ventricle has no regional wall motion abnormalities. The left ventricular internal cavity size was normal in size. There is  mild asymmetric left ventricular hypertrophy of the basal-septal segment. Left ventricular diastolic parameters are consistent with Grade II diastolic dysfunction (pseudonormalization).  Right Ventricle: The right ventricular size is normal. No increase in right ventricular wall thickness. Right ventricular systolic function is normal. There is normal pulmonary artery systolic pressure. The tricuspid regurgitant velocity is 2.60 m/s, and  with an assumed right atrial pressure of 3 mmHg, the  estimated right ventricular systolic pressure is 30.0 mmHg.  Left Atrium: Left atrial size was mildly dilated.  Right Atrium: Right atrial size was normal in size.  Pericardium: There is no evidence of pericardial effusion.  Mitral Valve: The mitral valve is degenerative in appearance. Moderate mitral annular calcification. Mild mitral valve regurgitation. No evidence of mitral valve stenosis.  Tricuspid Valve: The tricuspid valve is grossly normal. Tricuspid valve regurgitation is mild . No evidence of tricuspid stenosis.  Aortic Valve: The aortic valve is tricuspid. There is mild  calcification of the aortic valve. There is mild thickening of the aortic valve. Aortic valve regurgitation is not visualized. Mild aortic stenosis is present. Aortic valve mean gradient measures  16.0 mmHg. Aortic valve peak gradient measures 29.2 mmHg. Aortic valve area, by VTI measures 1.15 cm.  Pulmonic Valve: The pulmonic valve was grossly normal. Pulmonic valve regurgitation is not visualized. No evidence of pulmonic stenosis.  Aorta: The aortic root and ascending aorta are structurally normal, with no evidence of dilitation.  Venous: The inferior vena cava is normal in size with greater than 50% respiratory variability, suggesting right atrial pressure of 3 mmHg.  IAS/Shunts: The atrial septum is grossly normal.     Recent Labs: 01/28/2024: Magnesium  1.8 02/05/2024: ALT 11 04/23/2024: BUN 15; Creatinine, Ser 1.28; Hemoglobin 11.8; Platelets 180; Potassium 4.2; Sodium 140  Recent Lipid Panel No results found for: CHOL, TRIG, HDL, CHOLHDL, VLDL, LDLCALC, LDLDIRECT  Physical Exam:    VS:  BP (!) 104/50 (BP Location: Left Arm, Patient Position: Sitting, Cuff Size: Normal)   Pulse 74   Ht 4' 8 (1.422 m)   Wt 138 lb 3.2 oz (62.7 kg)   LMP  (LMP Unknown)   SpO2 93%   BMI 30.98 kg/m     Wt Readings from Last 3 Encounters:  05/20/24 138 lb 3.2 oz (62.7 kg)  04/28/24 137 lb 4 oz (62.3 kg)  04/27/24 137 lb 4 oz (62.3 kg)     GEN: Well nourished, well developed in no acute distress HEENT: Normal NECK: No JVD; No carotid bruits LYMPHATICS: No lymphadenopathy CARDIAC: S1S2 noted,RRR, no murmurs, rubs, gallops RESPIRATORY:  Clear to auscultation without rales, wheezing or rhonchi  ABDOMEN: Soft, non-tender, non-distended, +bowel sounds, no guarding. EXTREMITIES: No edema, No cyanosis, no clubbing MUSCULOSKELETAL:  No deformity  SKIN: Warm and dry NEUROLOGIC:  Alert and oriented x 3, non-focal PSYCHIATRIC:  Normal affect, good insight  ASSESSMENT:    1.  Nonrheumatic aortic valve stenosis   2. Mild mitral valve stenosis   3. PSVT (paroxysmal supraventricular tachycardia) (HCC)      PLAN:    PSVT Mitral valve stenosis Aortic valve sclerosis/mild to moderate  Cardiovascular standpoint she is doing well.  She denies any symptoms at this time.  Clinically does not appear to be volume overloaded.   The patient does not have any unstable cardiac conditions.  Upon evaluation today,she can achieve 4 METs or greater without anginal symptoms.  According to [facility] and AHA guidelines, she requires no further cardiac workup prior to her noncardiac surgery and should be at acceptable risk.  Our service is available as necessary in the perioperative period.  The patient is in agreement with the above plan. The patient left the office in stable condition.  The patient will follow up in 9 months   Medication Adjustments/Labs and Tests Ordered: Current medicines are reviewed at length with the patient today.  Concerns regarding medicines are outlined above.  No orders of the  defined types were placed in this encounter.  No orders of the defined types were placed in this encounter.   Patient Instructions  Medication Instructions:  Your physician recommends that you continue on your current medications as directed. Please refer to the Current Medication list given to you today.  *If you need a refill on your cardiac medications before your next appointment, please call your pharmacy*  Follow-Up: At [facility], you and your health needs are our priority.  As part of our continuing mission to provide you with exceptional heart care, our providers are all part of one team.  This team includes your primary Cardiologist (physician) and Advanced Practice Providers or APPs (Physician Assistants and Nurse Practitioners) who all work together to provide you with the care you need, when you need it.  Your next appointment:   1 year(s)  Provider:    Calistro Rauf, DO     Adopting a Healthy Lifestyle.  Know what a healthy weight is for you (roughly BMI <25) and aim to maintain this   Aim for 7+ servings of fruits and vegetables daily   65-80+ fluid ounces of water  or unsweet tea for healthy kidneys   Limit to max 1 drink of alcohol per day; avoid smoking/tobacco   Limit animal fats in diet for cholesterol and heart health - choose grass fed whenever available   Avoid highly processed foods, and foods high in saturated/trans fats   Aim for low stress - take time to unwind and care for your mental health   Aim for 150 min of moderate intensity exercise weekly for heart health, and weights twice weekly for bone health   Aim for 7-9 hours of sleep daily   When it comes to diets, agreement about the perfect plan isnt easy to find, even among the experts. Experts at the [facility] [employer] developed an idea known as the Healthy Eating Plate. Just imagine a plate divided into logical, healthy portions.   The emphasis is on diet quality:   Load up on vegetables and fruits - one-half of your plate: Aim for color and variety, and remember that potatoes dont count.   Go for whole grains - one-quarter of your plate: Whole wheat, barley, wheat berries, quinoa, oats, brown rice, and foods made with them. If you want pasta, go with whole wheat pasta.   Protein power - one-quarter of your plate: Fish, chicken, beans, and nuts are all healthy, versatile protein sources. Limit red meat.   The diet, however, does go beyond the plate, offering a few other suggestions.   Use healthy plant oils, such as olive, canola, soy, corn, sunflower and peanut. Check the labels, and avoid partially hydrogenated oil, which have unhealthy trans fats.   If youre thirsty, drink water . Coffee and tea are good in moderation, but skip sugary drinks and limit milk and dairy products to one or two daily servings.   The type of carbohydrate in the  diet is more important than the amount. Some sources of carbohydrates, such as vegetables, fruits, whole grains, and beans-are healthier than others.   Finally, stay active  Signed, Nahlia Hellmann, DO  05/26/2024 11:55 AM    [facility]

## 2024-06-02 DIAGNOSIS — F3110 Bipolar disorder, current episode manic without psychotic features, unspecified: Secondary | ICD-10-CM | POA: Diagnosis not present

## 2024-06-03 DIAGNOSIS — K6289 Other specified diseases of anus and rectum: Secondary | ICD-10-CM | POA: Diagnosis not present

## 2024-06-03 DIAGNOSIS — K219 Gastro-esophageal reflux disease without esophagitis: Secondary | ICD-10-CM | POA: Diagnosis not present

## 2024-06-04 DIAGNOSIS — C652 Malignant neoplasm of left renal pelvis: Secondary | ICD-10-CM | POA: Diagnosis not present

## 2024-06-04 DIAGNOSIS — R8271 Bacteriuria: Secondary | ICD-10-CM | POA: Diagnosis not present

## 2024-06-04 DIAGNOSIS — N130 Hydronephrosis with ureteropelvic junction obstruction: Secondary | ICD-10-CM | POA: Diagnosis not present

## 2024-06-04 NOTE — Progress Notes (Addendum)
 COVID Vaccine Completed: yes  Date of COVID positive in last 90 days:  PCP - Garnette Gleason, MD Cardiologist - Dub Huntsman, DO  Chest x-ray - 02/05/24 Epic EKG - 04/27/24 Epic Stress Test - 02/15/23 Epic ECHO - 05/08/24 Epic Cardiac Cath - n/a Pacemaker/ICD device last checked: n/a Spinal Cord Stimulator: n/a  Bowel Prep - clear liquids day before  Sleep Study - yes, every night per pt CPAP -   Fasting Blood Sugar - no checks or meds at home per pt Checks Blood Sugar  times a day  Last dose of GLP1 agonist-   GLP1 instructions:  Do not take after     Last dose of SGLT-2 inhibitors-   SGLT-2 instructions:  Do not take after     Blood Thinner Instructions:  Last dose: n/an/a  Time: Aspirin  Instructions: Last Dose:  Activity level: Can go up a flight of stairs and perform activities of daily living without stopping and without symptoms of chest pain. SOB with exertion, perpt not new or changing    Anesthesia review: heart murmur, aneamia, OSA, vertigo, DM2, SOB, aortic atherosclerosis, CKD 3, tremor  Patient denies shortness of breath, fever, cough and chest pain at PAT appointment  Patient verbalized understanding of instructions that were given to them at the PAT appointment. Patient was also instructed that they will need to review over the PAT instructions again at home before surgery.

## 2024-06-04 NOTE — Patient Instructions (Addendum)
 SURGICAL WAITING ROOM VISITATION  Patients having surgery or a procedure may have no more than 2 support people in the waiting area - these visitors may rotate.    Children under the age of 70 must have an adult with them who is not the patient.  Visitors with respiratory illnesses are discouraged from visiting and should remain at home.  If the patient needs to stay at the hospital during part of their recovery, the visitor guidelines for inpatient rooms apply. Pre-op nurse will coordinate an appropriate time for 1 support person to accompany patient in pre-op.  This support person may not rotate.    Please refer to the [facility] website for the visitor guidelines for Inpatients (after your surgery is over and you are in a regular room).    Your procedure is scheduled on: 06/17/24   Report to [facility] Main Entrance    Report to admitting at 10:15 AM   Call this number if you have problems the morning of surgery [phone]   Follow a clear liquid diet the day before surgery.  Water  Non-Citrus Juices (without pulp, NO RED-Apple, White grape, White cranberry) Black Coffee (NO MILK/CREAM OR CREAMERS, sugar ok)  Clear Tea (NO MILK/CREAM OR CREAMERS, sugar ok) regular and decaf                             Plain Jell-O (NO RED)                                           Fruit ices (not with fruit pulp, NO RED)                                     Popsicles (NO RED)                                                               Sports drinks like Gatorade (NO RED)  Nothing to drink after midnight.                If you have questions, please contact your surgeon's office.   FOLLOW BOWEL PREP AND ANY ADDITIONAL PRE OP INSTRUCTIONS YOU RECEIVED FROM YOUR SURGEON'S OFFICE!!!     Oral Hygiene is also important to reduce your risk of infection.                                    Remember - BRUSH YOUR TEETH THE MORNING OF SURGERY WITH YOUR REGULAR TOOTHPASTE  DENTURES WILL BE  REMOVED PRIOR TO SURGERY PLEASE DO NOT APPLY Poly grip OR ADHESIVES!!!   Stop all vitamins and herbal supplements 7 days before surgery.   Take these medicines the morning of surgery with A SIP OF WATER : Tylenol , Abilify, Alprazolam , Bupropion , Depakote , Levothyroxine , Lexapro, Metoprolol , Pantoprazole    DO NOT TAKE ANY ORAL DIABETIC MEDICATIONS DAY OF YOUR SURGERY  How to Manage Your Diabetes Before and After Surgery  Why is it important to control my  blood sugar before and after surgery? Improving blood sugar levels before and after surgery helps healing and can limit problems. A way of improving blood sugar control is eating a healthy diet by:  Eating less sugar and carbohydrates  Increasing activity/exercise  Talking with your doctor about reaching your blood sugar goals High blood sugars (greater than 180 mg/dL) can raise your risk of infections and slow your recovery, so you will need to focus on controlling your diabetes during the weeks before surgery. Make sure that the doctor who takes care of your diabetes knows about your planned surgery including the date and location.  How do I manage my blood sugar before surgery? Check your blood sugar at least 4 times a day, starting 2 days before surgery, to make sure that the level is not too high or low. Check your blood sugar the morning of your surgery when you wake up and every 2 hours until you get to the Short Stay unit. If your blood sugar is less than 70 mg/dL, you will need to treat for low blood sugar: Do not take insulin . Treat a low blood sugar (less than 70 mg/dL) with  cup of clear juice (cranberry or apple), 4 glucose tablets, OR glucose gel. Recheck blood sugar in 15 minutes after treatment (to make sure it is greater than 70 mg/dL). If your blood sugar is not greater than 70 mg/dL on recheck, call [phone] for further instructions. Report your blood sugar to the short stay nurse when you get to Short Stay.  If  you are admitted to the hospital after surgery: Your blood sugar will be checked by the staff and you will probably be given insulin  after surgery (instead of oral diabetes medicines) to make sure you have good blood sugar levels. The goal for blood sugar control after surgery is 80-180 mg/dL.  Reviewed and Endorsed by [facility] Patient Education Committee, August 2015  Bring CPAP mask and tubing day of surgery.                              You may not have any metal on your body including hair pins, jewelry, and body piercing             Do not wear make-up, lotions, powders, perfumes, or deodorant  Do not wear nail polish including gel and S&S, artificial/acrylic nails, or any other type of covering on natural nails including finger and toenails. If you have artificial nails, gel coating, etc. that needs to be removed by a nail salon please have this removed prior to surgery or surgery may need to be canceled/ delayed if the surgeon/ anesthesia feels like they are unable to be safely monitored.   Do not shave  48 hours prior to surgery.    Do not bring valuables to the hospital. [facility] IS NOT             RESPONSIBLE   FOR VALUABLES.   Contacts, glasses, dentures or bridgework may not be worn into surgery.   Bring small overnight bag day of surgery.   DO NOT BRING YOUR HOME MEDICATIONS TO THE HOSPITAL. PHARMACY WILL DISPENSE MEDICATIONS LISTED ON YOUR MEDICATION LIST TO YOU DURING YOUR ADMISSION IN THE HOSPITAL!              Please read over the following fact sheets you were given: IF YOU HAVE QUESTIONS ABOUT YOUR PRE-OP INSTRUCTIONS PLEASE CALL [phone]-  [place].   If you received a COVID test during your pre-op visit  it is requested that you wear a mask when out in public, stay away from anyone that may not be feeling well and notify your surgeon if you develop symptoms. If you test positive for Covid or have been in contact with anyone that has tested positive in the last  10 days please notify you surgeon.    [facility] - Preparing for Surgery Before surgery, you can play an important role.  Because skin is not sterile, your skin needs to be as free of germs as possible.  You can reduce the number of germs on your skin by washing with CHG (chlorahexidine gluconate) soap before surgery.  CHG is an antiseptic cleaner which kills germs and bonds with the skin to continue killing germs even after washing. Please DO NOT use if you have an allergy to CHG or antibacterial soaps.  If your skin becomes reddened/irritated stop using the CHG and inform your nurse when you arrive at Short Stay. Do not shave (including legs and underarms) for at least 48 hours prior to the first CHG shower.  You may shave your face/neck.  Please follow these instructions carefully:  1.  Shower with CHG Soap the night before surgery and the  morning of surgery.  2.  If you choose to wash your hair, wash your hair first as usual with your normal  shampoo.  3.  After you shampoo, rinse your hair and body thoroughly to remove the shampoo.                             4.  Use CHG as you would any other liquid soap.  You can apply chg directly to the skin and wash.  Gently with a scrungie or clean washcloth.  5.  Apply the CHG Soap to your body ONLY FROM THE NECK DOWN.   Do   not use on face/ open                           Wound or open sores. Avoid contact with eyes, ears mouth and   genitals (private parts).                       Wash face,  Genitals (private parts) with your normal soap.             6.  Wash thoroughly, paying special attention to the area where your    surgery  will be performed.  7.  Thoroughly rinse your body with warm water  from the neck down.  8.  DO NOT shower/wash with your normal soap after using and rinsing off the CHG Soap.                9.  Pat yourself dry with a clean towel.            10.  Wear clean pajamas.            11.  Place clean sheets on your bed the night  of your first shower and do not  sleep with pets. Day of Surgery : Do not apply any lotions/deodorants the morning of surgery.  Please wear clean clothes to the hospital/surgery center.  FAILURE TO FOLLOW THESE INSTRUCTIONS MAY RESULT IN THE CANCELLATION OF YOUR SURGERY  PATIENT SIGNATURE_________________________________  NURSE SIGNATURE__________________________________  ________________________________________________________________________  WHAT IS A BLOOD TRANSFUSION? Blood Transfusion Information  A transfusion is the replacement of blood or some of its parts. Blood is made up of multiple cells which provide different functions. Red blood cells carry oxygen and are used for blood loss replacement. White blood cells fight against infection. Platelets control bleeding. Plasma helps clot blood. Other blood products are available for specialized needs, such as hemophilia or other clotting disorders. BEFORE THE TRANSFUSION  Who gives blood for transfusions?  Healthy volunteers who are fully evaluated to make sure their blood is safe. This is blood bank blood. Transfusion therapy is the safest it has ever been in the practice of medicine. Before blood is taken from a donor, a complete history is taken to make sure that person has no history of diseases nor engages in risky social behavior (examples are intravenous drug use or sexual activity with multiple partners). The donor's travel history is screened to minimize risk of transmitting infections, such as malaria. The donated blood is tested for signs of infectious diseases, such as HIV and hepatitis. The blood is then tested to be sure it is compatible with you in order to minimize the chance of a transfusion reaction. If you or a relative donates blood, this is often done in anticipation of surgery and is not appropriate for emergency situations. It takes many days to process the donated blood. RISKS AND COMPLICATIONS Although transfusion  therapy is very safe and saves many lives, the main dangers of transfusion include:  Getting an infectious disease. Developing a transfusion reaction. This is an allergic reaction to something in the blood you were given. Every precaution is taken to prevent this. The decision to have a blood transfusion has been considered carefully by your caregiver before blood is given. Blood is not given unless the benefits outweigh the risks. AFTER THE TRANSFUSION Right after receiving a blood transfusion, you will usually feel much better and more energetic. This is especially true if your red blood cells have gotten low (anemic). The transfusion raises the level of the red blood cells which carry oxygen, and this usually causes an energy increase. The nurse administering the transfusion will monitor you carefully for complications. HOME CARE INSTRUCTIONS  No special instructions are needed after a transfusion. You may find your energy is better. Speak with your caregiver about any limitations on activity for underlying diseases you may have. SEEK MEDICAL CARE IF:  Your condition is not improving after your transfusion. You develop redness or irritation at the intravenous (IV) site. SEEK IMMEDIATE MEDICAL CARE IF:  Any of the following symptoms occur over the next 12 hours: Shaking chills. You have a temperature by mouth above 102 F (38.9 C), not controlled by medicine. Chest, back, or muscle pain. People around you feel you are not acting correctly or are confused. Shortness of breath or difficulty breathing. Dizziness and fainting. You get a rash or develop hives. You have a decrease in urine output. Your urine turns a dark color or changes to pink, red, or brown. Any of the following symptoms occur over the next 10 days: You have a temperature by mouth above 102 F (38.9 C), not controlled by medicine. Shortness of breath. Weakness after normal activity. The white part of the eye turns yellow  (jaundice). You have a decrease in the amount of urine or are urinating less often. Your urine turns a dark color or changes to pink, red, or brown. Document Released: 10/05/2000 Document Revised: 12/31/2011 Document Reviewed:  05/24/2008 ExitCare Patient Information 2014 [place], [place].  _______________________________________________________________________

## 2024-06-05 ENCOUNTER — Other Ambulatory Visit: Payer: Self-pay

## 2024-06-05 ENCOUNTER — Encounter (HOSPITAL_COMMUNITY): Payer: Self-pay

## 2024-06-05 ENCOUNTER — Encounter (HOSPITAL_COMMUNITY)
Admission: RE | Admit: 2024-06-05 | Discharge: 2024-06-05 | Disposition: A | Discharge status: Home / Self Care | Source: Ambulatory Visit | Attending: Urology | Admitting: Urology

## 2024-06-05 DIAGNOSIS — Z01818 Encounter for other preprocedural examination: Secondary | ICD-10-CM | POA: Insufficient documentation

## 2024-06-05 HISTORY — DX: Malignant (primary) neoplasm, unspecified: C80.1

## 2024-06-05 LAB — CBC
HCT: 38.5 % (ref 36.0–46.0)
Hemoglobin: 11.3 g/dL — ABNORMAL LOW (ref 12.0–15.0)
MCH: 28.9 pg (ref 26.0–34.0)
MCHC: 29.4 g/dL — ABNORMAL LOW (ref 30.0–36.0)
MCV: 98.5 fL (ref 80.0–100.0)
Platelets: 204 K/uL (ref 150–400)
RBC: 3.91 MIL/uL (ref 3.87–5.11)
RDW: 13.2 % (ref 11.5–15.5)
WBC: 5.2 K/uL (ref 4.0–10.5)
nRBC: 0 % (ref 0.0–0.2)

## 2024-06-05 LAB — BASIC METABOLIC PANEL WITH GFR
Anion gap: 11 (ref 5–15)
BUN: 21 mg/dL (ref 8–23)
CO2: 22 mmol/L (ref 22–32)
Calcium: 8.8 mg/dL — ABNORMAL LOW (ref 8.9–10.3)
Chloride: 110 mmol/L (ref 98–111)
Creatinine, Ser: 1.46 mg/dL — ABNORMAL HIGH (ref 0.44–1.00)
GFR, Estimated: 37 mL/min — ABNORMAL LOW (ref >60)
Glucose, Bld: 103 mg/dL — ABNORMAL HIGH (ref 70–99)
Potassium: 4.9 mmol/L (ref 3.5–5.1)
Sodium: 143 mmol/L (ref 135–145)

## 2024-06-05 LAB — TYPE AND SCREEN
ABO/RH(D): A POS
Antibody Screen: NEGATIVE

## 2024-06-10 NOTE — Anesthesia Preprocedure Evaluation (Addendum)
 Anesthesia Evaluation  Patient identified by MRN, date of birth, ID band Patient awake    Reviewed: Allergy & Precautions, NPO status , Patient's Chart, lab work & pertinent test results  History of Anesthesia Complications Negative for: history of anesthetic complications  Airway Mallampati: III  TM Distance: >3 FB Neck ROM: Full  Mouth opening: Limited Mouth Opening  Dental no notable dental hx. (+) Teeth Intact, Dental Advisory Given, Chipped   Pulmonary sleep apnea , former smoker   Pulmonary exam normal breath sounds clear to auscultation       Cardiovascular (-) angina + Peripheral Vascular Disease  (-) Past MI Normal cardiovascular exam+ Valvular Problems/Murmurs AS and MR  Rhythm:Regular Rate:Normal  05/08/2024 TTE  1. Left ventricular ejection fraction, by estimation, is 65 to 70%. The  left ventricle has normal function. The left ventricle has no regional  wall motion abnormalities. There is mild concentric left ventricular  hypertrophy. Left ventricular diastolic  parameters are indeterminate.   2. Right ventricular systolic function is normal. The right ventricular  size is normal.   3. Left atrial size was severely dilated.   4. The mitral valve is abnormal. Trivial mitral valve regurgitation. Mild  mitral stenosis. The mean mitral valve gradient is 7.0 mmHg. Severe mitral  annular calcification.   5. The aortic valve is tricuspid. There is moderate calcification of the  aortic valve. There is moderate thickening of the aortic valve. Aortic  valve regurgitation is not visualized. Mild to moderate aortic valve  stenosis. Aortic valve area, by VTI  measures 1.53 cm. Aortic valve mean gradient measures 17.0 mmHg. Aortic  valve Vmax measures 1.82 m/s.   6. The inferior vena cava is normal in size with greater than 50%  respiratory variability, suggesting right atrial pressure of 3 mmHg.      Neuro/Psych   Headaches PSYCHIATRIC DISORDERS Anxiety Depression Bipolar Disorder      GI/Hepatic Neg liver ROS,GERD  ,,  Endo/Other  diabetes, Type 2Hypothyroidism    Renal/GU Renal InsufficiencyRenal diseaseLab Results      Component                Value               Date                      NA                       143                 06/05/2024                CL                       110                 06/05/2024                K                        4.9                 06/05/2024                CO2                      22  06/05/2024                BUN                      21                  06/05/2024                CREATININE               1.46 (H)            06/05/2024                GFRNONAA                 37 (L)              06/05/2024                CALCIUM                  8.8 (L)             06/05/2024                ALBUMIN                  2.3 (L)             02/05/2024                GLUCOSE                  103 (H)             06/05/2024                Musculoskeletal  (+) Arthritis , Osteoarthritis,    Abdominal  (+) + obese  Peds  Hematology negative hematology ROS (+) Blood dyscrasia, anemia Lab Results      Component                Value               Date                      WBC                      5.2                 06/05/2024                HGB                      11.3 (L)            06/05/2024                HCT                      38.5                06/05/2024                MCV                      98.5                06/05/2024  PLT                      204                 06/05/2024              Anesthesia Other Findings   Reproductive/Obstetrics                              Anesthesia Physical Anesthesia Plan  ASA: 3  Anesthesia Plan: General   Post-op Pain Management: Lidocaine  infusion*, Precedex  and Ofirmev  IV (intra-op)*   Induction: Intravenous  PONV Risk Score and Plan: 3 and  Treatment may vary due to age or medical condition, Ondansetron , Dexamethasone  and Midazolam   Airway Management Planned: Oral ETT  Additional Equipment: None  Intra-op Plan:   Post-operative Plan: Extubation in OR  Informed Consent: I have reviewed the patients History and Physical, chart, labs and discussed the procedure including the risks, benefits and alternatives for the proposed anesthesia with the patient or authorized representative who has indicated his/her understanding and acceptance.     Dental advisory given  Plan Discussed with: CRNA and Anesthesiologist  Anesthesia Plan Comments: (See PAT note 06/05/2024)         Anesthesia Quick Evaluation

## 2024-06-10 NOTE — Progress Notes (Signed)
 Anesthesia Chart Review   Case: [id number] Date/Time: 06/17/24 1215   Procedures:      NEPHROURETERECTOMY, ROBOT-ASSISTED, LAPAROSCOPIC (Left) - ROBOTIC LEFT NEPHROURETERECTOMY, CYSTOSCOPY WITH TRANSURETHRAL RESECTION OF THE LEFT URETERAL ORIFICE     CYSTOSCOPY, WITH BLADDER FULGURATION (Left)   Anesthesia type: General   Diagnosis: Malignant neoplasm of left renal pelvis (HCC) [C65.2]   Pre-op diagnosis: LEFT RENAL PELVIS MASS   Location: [facility] 03 / [facility]   Surgeons: Devere Lonni Righter, MD       DISCUSSION:78 y.o. former smoker with h/o bipolar, hypothyroidism, OSA, DM II, mild-moderate AS (mean gradient 17.0 mmHg, valve area 1.53 cm, left renal pelvis mass scheduled for above procedure 06/17/24 with Dr. Lonni Devere.   Pt s/p gastric bypass.  Reports OSA and DM resolved after weight loss.   Patient followed by Cardiology for hx of palpitations and mild AS. Also with shortness of breath. Prior w/u in 09/2022 includes zio patch which showed runs of SVT. Echo in 01/2023 showed normal LVEF and mild AS. And stress testing in 01/2023 which was low risk. Echo 05/08/2024 with EF 65-70%, mild to moderate AS.   Pt last seen by cardiology 05/20/2024. Per OV notes, Cardiovascular standpoint she is doing well.  She denies any symptoms at this time.  Clinically does not appear to be volume overloaded.   The patient does not have any unstable cardiac conditions.  Upon evaluation today,she can achieve 4 METs or greater without anginal symptoms.  According to [facility] and AHA guidelines, she requires no further cardiac workup prior to her noncardiac surgery    VS: BP (!) 120/56   Pulse 69   Temp 36.7 C (Oral)   Resp 14   Ht 4' 8 (1.422 m)   Wt 62.6 kg   LMP  (LMP Unknown)   SpO2 95%   BMI 30.94 kg/m   PROVIDERS: Nanci Senior, MD is PCP   Cardiologist:  Dub Huntsman, DO  LABS: Labs reviewed: Acceptable for surgery. (all labs ordered are listed, but only abnormal results are  displayed)  Labs Reviewed  CBC - Abnormal; Notable for the following components:      Result Value   Hemoglobin 11.3 (*)    MCHC 29.4 (*)    All other components within normal limits  BASIC METABOLIC PANEL WITH GFR - Abnormal; Notable for the following components:   Glucose, Bld 103 (*)    Creatinine, Ser 1.46 (*)    Calcium 8.8 (*)    GFR, Estimated 37 (*)    All other components within normal limits  TYPE AND SCREEN     IMAGES: US  Carotid 03/02/2022 IMPRESSION: 1. Moderate amount of left-sided atherosclerotic plaque results in elevated peak systolic velocities within the left internal carotid artery compatible with the 50-69% luminal narrowing range. Further evaluation with CTA could be performed as clinically indicated. 2. Moderate amount of right-sided atherosclerotic plaque, not resulting in a hemodynamically significant stenosis.  EKG:   CV: Myocardial Perfusion 02/15/2023    The study is normal. The study is low risk.   No ST deviation was noted.   LV perfusion is normal. There is no evidence of ischemia.   Left ventricular function is normal. End diastolic cavity size is normal.   Prior study not available for comparison.   Normal resting and stress perfusion. No ischemia or infarction EF 56%   Echo 05/08/2024 1. Left ventricular ejection fraction, by estimation, is 65 to 70%. The  left ventricle has normal function. The  left ventricle has no regional  wall motion abnormalities. There is mild concentric left ventricular  hypertrophy. Left ventricular diastolic  parameters are indeterminate.   2. Right ventricular systolic function is normal. The right ventricular  size is normal.   3. Left atrial size was severely dilated.   4. The mitral valve is abnormal. Trivial mitral valve regurgitation. Mild  mitral stenosis. The mean mitral valve gradient is 7.0 mmHg. Severe mitral  annular calcification.   5. The aortic valve is tricuspid. There is moderate  calcification of the  aortic valve. There is moderate thickening of the aortic valve. Aortic  valve regurgitation is not visualized. Mild to moderate aortic valve  stenosis. Aortic valve area, by VTI  measures 1.53 cm. Aortic valve mean gradient measures 17.0 mmHg. Aortic  valve Vmax measures 1.82 m/s.   6. The inferior vena cava is normal in size with greater than 50%  respiratory variability, suggesting right atrial pressure of 3 mmHg.  Past Medical History:  Diagnosis Date   Abdominal aortic atherosclerosis (HCC)    Moderate   Anemia    Anxiety    Aortic stenosis, mild    Arthritis    Bipolar disorder (HCC)    type 2 hypomanic   Cancer (HCC)    kidney   Complication of anesthesia    pt request no spinal anesthesia only general   DDD (degenerative disc disease), lumbar    Depression    Diabetes mellitus without complication (HCC)    GERD (gastroesophageal reflux disease)    Headache    High cholesterol    History of diabetes mellitus    resolved after weight loss   History of mumps as a child    History of sleep apnea    most recent study was negative for sleep apnea, resolved after weight loss    Hydronephrosis    Hypothyroidism    Myelopathy (HCC)    Numbness and tingling in hands    Osteopenia    Osteoporosis    Right arm fracture 1953   x2   Scoliosis    per pt   Sleep apnea    Spinal stenosis    Moderate to severe   Vertigo    Wears glasses     Past Surgical History:  Procedure Laterality Date   APPENDECTOMY     CARPAL TUNNEL RELEASE Bilateral    CESAREAN SECTION  1971, 1973   x2   COLONOSCOPY WITH PROPOFOL  N/A 09/14/2019   Procedure: COLONOSCOPY WITH PROPOFOL ;  Surgeon: Dianna Specking, MD;  Location: [facility] ENDOSCOPY;  Service: Endoscopy;  Laterality: N/A;   CYSTOSCOPY W/ URETERAL STENT PLACEMENT Left 04/28/2024   Procedure: CYSTOSCOPY, URETEROSCOPY WITH RETROGRADE PYELOGRAM AND URETERAL STENT INSERTION;  Surgeon: Devere Lonni Righter, MD;   Location[facility];  Service: Urology;  Laterality: Left;   CYSTOSCOPY WITH STENT PLACEMENT Left 04/28/2024   Procedure: CYSTOSCOPY, LEFT URETEROSCOPY,  LEFT URETERAL DILATION WITH LEFT STENT INSERTION;  Surgeon: Devere Lonni Righter, MD;  Location[facility];  Service: Urology;  Laterality: Left;   ESOPHAGOGASTRODUODENOSCOPY (EGD) WITH PROPOFOL  N/A 09/14/2019   Procedure: ESOPHAGOGASTRODUODENOSCOPY (EGD) WITH PROPOFOL ;  Surgeon: Dianna Specking, MD;  Location: [facility] ENDOSCOPY;  Service: Endoscopy;  Laterality: N/A;   EYE SURGERY Bilateral 2006   cataracts   FRACTURE SURGERY     Arm   GASTRIC BYPASS  2006   POSTERIOR CERVICAL FUSION/FORAMINOTOMY N/A 11/25/2015   Procedure: Posterior Cervical Fusion with lateral mass fixation Cervical fiv-Thoracic one, cervical laminectomy  - Cervical  six-Cervical seven - Cervical seven-Thoracic one;  Surgeon: Alm GORMAN Molt, MD;  Location: [facility];  Service: Neurosurgery;  Laterality: N/A;   TOTAL KNEE ARTHROPLASTY Right 07/11/2017   Procedure: RIGHT TOTAL KNEE ARTHROPLASTY;  Surgeon: Duwayne Purchase, MD;  Location: [facility];  Service: Orthopedics;  Laterality: Right;  150 mins   TOTAL KNEE ARTHROPLASTY Left 03/12/2018   Procedure: LEFT TOTAL KNEE ARTHROPLASTY;  Surgeon: Duwayne Purchase, MD;  Location[facility];  Service: Orthopedics;  Laterality: Left;  Adductor Block   TOTAL SHOULDER ARTHROPLASTY Bilateral 2010, 2011    MEDICATIONS:  ABILIFY 10 MG tablet   acetaminophen  (TYLENOL ) 500 MG tablet   ALPRAZolam  (XANAX ) 0.5 MG tablet   amphetamine -dextroamphetamine  (ADDERALL) 30 MG tablet   b complex vitamins capsule   buPROPion  (WELLBUTRIN  XL) 300 MG 24 hr tablet   calcium carbonate (OS-CAL) 600 MG tablet   cholecalciferol (VITAMIN D3) 25 MCG (1000 UNIT) tablet   Chromium-Cinnamon (CINNAMON PLUS CHROMIUM) [phone] MCG-MG CAPS   Coenzyme Q10 (COQ-10) 400 MG CAPS   cyanocobalamin  (VITAMIN B12) 1000 MCG tablet   diphenhydrAMINE  (BENADRYL ) 25 MG tablet   divalproex   (DEPAKOTE  ER) 500 MG 24 hr tablet   ferrous sulfate  325 (65 FE) MG tablet   levothyroxine  (SYNTHROID , LEVOTHROID) 50 MCG tablet   LEXAPRO 20 MG tablet   lovastatin (MEVACOR) 10 MG tablet   magnesium  gluconate (MAGONATE) 500 (27 Mg) MG TABS tablet   meclizine (ANTIVERT) 25 MG tablet   methocarbamol  (ROBAXIN ) 500 MG tablet   metoprolol  succinate (TOPROL  XL) 25 MG 24 hr tablet   Multiple Vitamins-Minerals (MULTIVITAMIN WITH MINERALS) tablet   niacin  (VITAMIN B3) 500 MG tablet   Omega-3 Fatty Acids (FISH OIL) 1200 MG CAPS   pantoprazole  (PROTONIX ) 40 MG tablet   Potassium 99 MG TABS   Specialty Vitamins Products (BIOTIN PLUS KERATIN PO)   traZODone  (DESYREL ) 100 MG tablet   Turmeric (QC TUMERIC COMPLEX PO)   No current facility-administered medications for this encounter.   Harlene Hoots Ward, PA-C [facility] [phone]

## 2024-06-17 ENCOUNTER — Inpatient Hospital Stay (HOSPITAL_COMMUNITY)
Admission: RE | Admit: 2024-06-17 | Discharge: 2024-06-20 | DRG: 657 | Disposition: A | Discharge status: Home / Self Care | Attending: Urology | Admitting: Urology

## 2024-06-17 ENCOUNTER — Other Ambulatory Visit: Payer: Self-pay

## 2024-06-17 ENCOUNTER — Encounter (HOSPITAL_COMMUNITY): Payer: Self-pay | Admitting: Urology

## 2024-06-17 ENCOUNTER — Encounter (HOSPITAL_COMMUNITY)
Admission: RE | Disposition: A | Discharge status: Home / Self Care | Payer: Self-pay | Source: Home / Self Care | Attending: Urology

## 2024-06-17 ENCOUNTER — Inpatient Hospital Stay (HOSPITAL_COMMUNITY): Payer: Self-pay | Admitting: Medical

## 2024-06-17 ENCOUNTER — Inpatient Hospital Stay (HOSPITAL_COMMUNITY): Payer: Self-pay | Admitting: Anesthesiology

## 2024-06-17 DIAGNOSIS — Z8261 Family history of arthritis: Secondary | ICD-10-CM | POA: Diagnosis not present

## 2024-06-17 DIAGNOSIS — Z8249 Family history of ischemic heart disease and other diseases of the circulatory system: Secondary | ICD-10-CM | POA: Diagnosis not present

## 2024-06-17 DIAGNOSIS — Z1152 Encounter for screening for COVID-19: Secondary | ICD-10-CM | POA: Diagnosis not present

## 2024-06-17 DIAGNOSIS — E119 Type 2 diabetes mellitus without complications: Secondary | ICD-10-CM | POA: Diagnosis present

## 2024-06-17 DIAGNOSIS — F419 Anxiety disorder, unspecified: Secondary | ICD-10-CM | POA: Diagnosis present

## 2024-06-17 DIAGNOSIS — Z9109 Other allergy status, other than to drugs and biological substances: Secondary | ICD-10-CM

## 2024-06-17 DIAGNOSIS — Z981 Arthrodesis status: Secondary | ICD-10-CM | POA: Diagnosis not present

## 2024-06-17 DIAGNOSIS — E039 Hypothyroidism, unspecified: Secondary | ICD-10-CM | POA: Diagnosis present

## 2024-06-17 DIAGNOSIS — R0902 Hypoxemia: Secondary | ICD-10-CM | POA: Diagnosis not present

## 2024-06-17 DIAGNOSIS — J189 Pneumonia, unspecified organism: Secondary | ICD-10-CM | POA: Diagnosis not present

## 2024-06-17 DIAGNOSIS — E782 Mixed hyperlipidemia: Secondary | ICD-10-CM | POA: Diagnosis not present

## 2024-06-17 DIAGNOSIS — I10 Essential (primary) hypertension: Secondary | ICD-10-CM | POA: Diagnosis present

## 2024-06-17 DIAGNOSIS — Z96611 Presence of right artificial shoulder joint: Secondary | ICD-10-CM | POA: Diagnosis not present

## 2024-06-17 DIAGNOSIS — J9811 Atelectasis: Secondary | ICD-10-CM | POA: Diagnosis not present

## 2024-06-17 DIAGNOSIS — Z9884 Bariatric surgery status: Secondary | ICD-10-CM | POA: Diagnosis not present

## 2024-06-17 DIAGNOSIS — Z79899 Other long term (current) drug therapy: Secondary | ICD-10-CM

## 2024-06-17 DIAGNOSIS — N2889 Other specified disorders of kidney and ureter: Secondary | ICD-10-CM | POA: Diagnosis not present

## 2024-06-17 DIAGNOSIS — Z87891 Personal history of nicotine dependence: Secondary | ICD-10-CM

## 2024-06-17 DIAGNOSIS — K219 Gastro-esophageal reflux disease without esophagitis: Secondary | ICD-10-CM | POA: Diagnosis present

## 2024-06-17 DIAGNOSIS — Z96653 Presence of artificial knee joint, bilateral: Secondary | ICD-10-CM | POA: Diagnosis present

## 2024-06-17 DIAGNOSIS — J90 Pleural effusion, not elsewhere classified: Secondary | ICD-10-CM | POA: Diagnosis not present

## 2024-06-17 DIAGNOSIS — F319 Bipolar disorder, unspecified: Secondary | ICD-10-CM | POA: Diagnosis present

## 2024-06-17 DIAGNOSIS — Z79890 Hormone replacement therapy (postmenopausal): Secondary | ICD-10-CM | POA: Diagnosis not present

## 2024-06-17 DIAGNOSIS — D0919 Carcinoma in situ of other urinary organs: Secondary | ICD-10-CM | POA: Diagnosis not present

## 2024-06-17 DIAGNOSIS — R918 Other nonspecific abnormal finding of lung field: Secondary | ICD-10-CM | POA: Diagnosis not present

## 2024-06-17 DIAGNOSIS — M7989 Other specified soft tissue disorders: Secondary | ICD-10-CM | POA: Diagnosis present

## 2024-06-17 DIAGNOSIS — C689 Malignant neoplasm of urinary organ, unspecified: Secondary | ICD-10-CM | POA: Diagnosis not present

## 2024-06-17 DIAGNOSIS — C652 Malignant neoplasm of left renal pelvis: Principal | ICD-10-CM | POA: Diagnosis present

## 2024-06-17 DIAGNOSIS — Z96612 Presence of left artificial shoulder joint: Secondary | ICD-10-CM | POA: Diagnosis present

## 2024-06-17 DIAGNOSIS — R0989 Other specified symptoms and signs involving the circulatory and respiratory systems: Secondary | ICD-10-CM | POA: Diagnosis not present

## 2024-06-17 DIAGNOSIS — N1339 Other hydronephrosis: Secondary | ICD-10-CM | POA: Diagnosis present

## 2024-06-17 DIAGNOSIS — F418 Other specified anxiety disorders: Secondary | ICD-10-CM | POA: Diagnosis not present

## 2024-06-17 DIAGNOSIS — C651 Malignant neoplasm of right renal pelvis: Secondary | ICD-10-CM | POA: Diagnosis not present

## 2024-06-17 DIAGNOSIS — E7800 Pure hypercholesterolemia, unspecified: Secondary | ICD-10-CM | POA: Diagnosis present

## 2024-06-17 HISTORY — PX: ROBOT ASSITED LAPAROSCOPIC NEPHROURETERECTOMY: SHX6077

## 2024-06-17 HISTORY — PX: CYSTOSCOPY WITH FULGERATION: SHX6638

## 2024-06-17 LAB — HEMOGLOBIN AND HEMATOCRIT, BLOOD
HCT: 38.7 % (ref 36.0–46.0)
Hemoglobin: 11.9 g/dL — ABNORMAL LOW (ref 12.0–15.0)

## 2024-06-17 LAB — GLUCOSE, CAPILLARY
Glucose-Capillary: 83 mg/dL (ref 70–99)
Glucose-Capillary: 180 mg/dL — ABNORMAL HIGH (ref 70–99)

## 2024-06-17 SURGERY — NEPHROURETERECTOMY, ROBOT-ASSISTED, LAPAROSCOPIC
Anesthesia: General | Site: Bladder | Laterality: Left

## 2024-06-17 MED ORDER — ONDANSETRON HCL 4 MG/2ML IJ SOLN
4.0000 mg | Freq: Four times a day (QID) | INTRAMUSCULAR | Status: DC | PRN
Start: 2024-06-17 — End: 2024-06-20

## 2024-06-17 MED ORDER — DEXMEDETOMIDINE HCL IN NACL 80 MCG/20ML IV SOLN
INTRAVENOUS | Status: DC | PRN
Start: 2024-06-17 — End: 2024-06-17
  Administered 2024-06-17 (×3): 4 ug via INTRAVENOUS

## 2024-06-17 MED ORDER — BUPROPION HCL ER (XL) 300 MG PO TB24
300.0000 mg | ORAL_TABLET | Freq: Every morning | ORAL | Status: DC
  Administered 2024-06-18 – 2024-06-20 (×3): 300 mg via ORAL
  Filled 2024-06-17 (×3): qty 1

## 2024-06-17 MED ORDER — ONDANSETRON HCL 4 MG/2ML IJ SOLN
4.0000 mg | Freq: Once | INTRAMUSCULAR | Status: AC | PRN
  Administered 2024-06-17: 4 mg via INTRAVENOUS

## 2024-06-17 MED ORDER — SODIUM CHLORIDE 0.9 % IR SOLN
Status: DC | PRN
  Administered 2024-06-17: 3000 mL via INTRAVESICAL

## 2024-06-17 MED ORDER — ONDANSETRON HCL 4 MG/2ML IJ SOLN
INTRAMUSCULAR | Status: DC | PRN
  Administered 2024-06-17: 4 mg via INTRAVENOUS

## 2024-06-17 MED ORDER — ORAL CARE MOUTH RINSE
15.0000 mL | Freq: Once | OROMUCOSAL | Status: AC
  Administered 2024-06-17: 15 mL via OROMUCOSAL

## 2024-06-17 MED ORDER — LIDOCAINE HCL (CARDIAC) PF 100 MG/5ML IV SOSY
PREFILLED_SYRINGE | INTRAVENOUS | Status: DC | PRN
  Administered 2024-06-17: 60 mg via INTRAVENOUS

## 2024-06-17 MED ORDER — OXYCODONE HCL 5 MG PO TABS: 5.0000 mg | ORAL_TABLET | Freq: Once | ORAL | Status: DC | PRN

## 2024-06-17 MED ORDER — PANTOPRAZOLE SODIUM 40 MG PO TBEC
40.0000 mg | DELAYED_RELEASE_TABLET | Freq: Every morning | ORAL | Status: DC
  Administered 2024-06-18 – 2024-06-20 (×3): 40 mg via ORAL
  Filled 2024-06-17 (×3): qty 1

## 2024-06-17 MED ORDER — DIVALPROEX SODIUM ER 500 MG PO TB24
1000.0000 mg | ORAL_TABLET | Freq: Every day | ORAL | Status: DC
  Administered 2024-06-18 – 2024-06-20 (×3): 1000 mg via ORAL
  Filled 2024-06-17 (×3): qty 2

## 2024-06-17 MED ORDER — LIDOCAINE HCL (PF) 2 % IJ SOLN
INTRAMUSCULAR | Status: DC | PRN
  Administered 2024-06-17: 1 mg/kg/h

## 2024-06-17 MED ORDER — DEXAMETHASONE SODIUM PHOSPHATE 10 MG/ML IJ SOLN
INTRAMUSCULAR | Status: DC | PRN
Start: 2024-06-17 — End: 2024-06-17
  Administered 2024-06-17: 5 mg via INTRAVENOUS

## 2024-06-17 MED ORDER — METHOCARBAMOL 500 MG PO TABS
500.0000 mg | ORAL_TABLET | Freq: Four times a day (QID) | ORAL | Status: DC | PRN
  Administered 2024-06-18: 500 mg via ORAL
  Filled 2024-06-17: qty 1

## 2024-06-17 MED ORDER — POTASSIUM CHLORIDE IN NACL 20-0.9 MEQ/L-% IV SOLN
INTRAVENOUS | Status: AC
  Filled 2024-06-17 (×2): qty 1000

## 2024-06-17 MED ORDER — ROCURONIUM BROMIDE 10 MG/ML (PF) SYRINGE
PREFILLED_SYRINGE | INTRAVENOUS | Status: AC
  Filled 2024-06-17: qty 10

## 2024-06-17 MED ORDER — DEXAMETHASONE SODIUM PHOSPHATE 10 MG/ML IJ SOLN
INTRAMUSCULAR | Status: AC
Start: 2024-06-17 — End: 2024-06-17
  Filled 2024-06-17: qty 1

## 2024-06-17 MED ORDER — ALPRAZOLAM 0.5 MG PO TABS
0.5000 mg | ORAL_TABLET | Freq: Three times a day (TID) | ORAL | Status: DC | PRN
  Administered 2024-06-18: 0.5 mg via ORAL
  Filled 2024-06-17: qty 1

## 2024-06-17 MED ORDER — ROCURONIUM BROMIDE 100 MG/10ML IV SOLN
INTRAVENOUS | Status: DC | PRN
  Administered 2024-06-17: 20 mg via INTRAVENOUS
  Administered 2024-06-17: 50 mg via INTRAVENOUS

## 2024-06-17 MED ORDER — DOCUSATE SODIUM 100 MG PO CAPS
100.0000 mg | ORAL_CAPSULE | Freq: Two times a day (BID) | ORAL | Status: DC
  Administered 2024-06-17 – 2024-06-20 (×6): 100 mg via ORAL
  Filled 2024-06-17 (×6): qty 1

## 2024-06-17 MED ORDER — LEVOTHYROXINE SODIUM 50 MCG PO TABS
50.0000 ug | ORAL_TABLET | Freq: Every day | ORAL | Status: DC
  Administered 2024-06-18 – 2024-06-20 (×3): 50 ug via ORAL
  Filled 2024-06-17 (×3): qty 1

## 2024-06-17 MED ORDER — OXYCODONE HCL 5 MG/5ML PO SOLN: 5.0000 mg | Freq: Once | ORAL | Status: DC | PRN

## 2024-06-17 MED ORDER — FENTANYL CITRATE PF 50 MCG/ML IJ SOSY
25.0000 ug | PREFILLED_SYRINGE | INTRAMUSCULAR | Status: DC | PRN
  Administered 2024-06-17: 50 ug via INTRAVENOUS

## 2024-06-17 MED ORDER — STERILE WATER FOR IRRIGATION IR SOLN
Status: DC | PRN
  Administered 2024-06-17: 1000 mL

## 2024-06-17 MED ORDER — ONDANSETRON HCL 4 MG/2ML IJ SOLN
INTRAMUSCULAR | Status: AC
Start: 2024-06-17 — End: 2024-06-17
  Filled 2024-06-17: qty 2

## 2024-06-17 MED ORDER — DEXAMETHASONE SODIUM PHOSPHATE 10 MG/ML IJ SOLN
INTRAMUSCULAR | Status: AC
  Filled 2024-06-17: qty 1

## 2024-06-17 MED ORDER — SUGAMMADEX SODIUM 200 MG/2ML IV SOLN
INTRAVENOUS | Status: DC | PRN
  Administered 2024-06-17: 200 mg via INTRAVENOUS

## 2024-06-17 MED ORDER — FENTANYL CITRATE (PF) 100 MCG/2ML IJ SOLN
INTRAMUSCULAR | Status: DC | PRN
  Administered 2024-06-17 (×4): 25 ug via INTRAVENOUS
  Administered 2024-06-17: 50 ug via INTRAVENOUS
  Administered 2024-06-17: 25 ug via INTRAVENOUS

## 2024-06-17 MED ORDER — HYDROMORPHONE HCL 1 MG/ML IJ SOLN: 1.0000 mg | INTRAMUSCULAR | Status: DC | PRN

## 2024-06-17 MED ORDER — OXYCODONE HCL 5 MG PO TABS
5.0000 mg | ORAL_TABLET | ORAL | Status: DC | PRN
  Administered 2024-06-18 – 2024-06-20 (×6): 5 mg via ORAL
  Filled 2024-06-17 (×7): qty 1

## 2024-06-17 MED ORDER — FENTANYL CITRATE (PF) 100 MCG/2ML IJ SOLN
INTRAMUSCULAR | Status: AC
Start: 2024-06-17 — End: 2024-06-17
  Filled 2024-06-17: qty 2

## 2024-06-17 MED ORDER — SODIUM CHLORIDE (PF) 0.9 % IJ SOLN
INTRAMUSCULAR | Status: DC | PRN
  Administered 2024-06-17: 40 mL

## 2024-06-17 MED ORDER — ESCITALOPRAM OXALATE 20 MG PO TABS
20.0000 mg | ORAL_TABLET | Freq: Every morning | ORAL | Status: DC
  Administered 2024-06-18 – 2024-06-20 (×3): 20 mg via ORAL
  Filled 2024-06-17 (×3): qty 1

## 2024-06-17 MED ORDER — ONDANSETRON 4 MG PO TBDP
4.0000 mg | ORAL_TABLET | Freq: Four times a day (QID) | ORAL | Status: DC | PRN
Start: 2024-06-17 — End: 2024-06-20

## 2024-06-17 MED ORDER — PROPOFOL 10 MG/ML IV BOLUS
INTRAVENOUS | Status: DC | PRN
  Administered 2024-06-17: 90 mg via INTRAVENOUS

## 2024-06-17 MED ORDER — LACTATED RINGERS IV SOLN
INTRAVENOUS | Status: DC
  Administered 2024-06-17: 1000 mL via INTRAVENOUS

## 2024-06-17 MED ORDER — CEFAZOLIN SODIUM-DEXTROSE 2-4 GM/100ML-% IV SOLN
2.0000 g | INTRAVENOUS | Status: AC
  Administered 2024-06-17: 2 g via INTRAVENOUS
  Filled 2024-06-17: qty 100

## 2024-06-17 MED ORDER — BUPIVACAINE LIPOSOME 1.3 % IJ SUSP
INTRAMUSCULAR | Status: AC
  Filled 2024-06-17: qty 20

## 2024-06-17 MED ORDER — FENTANYL CITRATE (PF) 100 MCG/2ML IJ SOLN
INTRAMUSCULAR | Status: AC
  Filled 2024-06-17: qty 2

## 2024-06-17 MED ORDER — SODIUM CHLORIDE (PF) 0.9 % IJ SOLN
INTRAMUSCULAR | Status: AC
  Filled 2024-06-17: qty 20

## 2024-06-17 MED ORDER — ACETAMINOPHEN 10 MG/ML IV SOLN
INTRAVENOUS | Status: AC
  Filled 2024-06-17: qty 100

## 2024-06-17 MED ORDER — HYDROCODONE-ACETAMINOPHEN 5-325 MG PO TABS: 1.0000 | ORAL_TABLET | Freq: Four times a day (QID) | ORAL | 0 refills | Status: AC | PRN

## 2024-06-17 MED ORDER — DOCUSATE SODIUM 100 MG PO CAPS: 100.0000 mg | ORAL_CAPSULE | Freq: Two times a day (BID) | ORAL | Status: DC

## 2024-06-17 MED ORDER — FENTANYL CITRATE PF 50 MCG/ML IJ SOSY
PREFILLED_SYRINGE | INTRAMUSCULAR | Status: AC
  Filled 2024-06-17: qty 1

## 2024-06-17 MED ORDER — METOPROLOL SUCCINATE ER 25 MG PO TB24
12.5000 mg | ORAL_TABLET | Freq: Every day | ORAL | Status: DC
  Filled 2024-06-17 (×2): qty 1

## 2024-06-17 MED ORDER — CHLORHEXIDINE GLUCONATE 0.12 % MT SOLN: 15.0000 mL | Freq: Once | OROMUCOSAL | Status: AC

## 2024-06-17 MED ORDER — LIDOCAINE HCL (PF) 2 % IJ SOLN
INTRAMUSCULAR | Status: AC
  Filled 2024-06-17: qty 10

## 2024-06-17 MED ORDER — SENNA 8.6 MG PO TABS
1.0000 | ORAL_TABLET | Freq: Two times a day (BID) | ORAL | Status: DC
  Administered 2024-06-17 – 2024-06-20 (×6): 8.6 mg via ORAL
  Filled 2024-06-17 (×6): qty 1

## 2024-06-17 MED ORDER — PROPOFOL 10 MG/ML IV BOLUS
INTRAVENOUS | Status: AC
  Filled 2024-06-17: qty 20

## 2024-06-17 MED ORDER — ACETAMINOPHEN 10 MG/ML IV SOLN
1000.0000 mg | Freq: Four times a day (QID) | INTRAVENOUS | Status: AC
  Administered 2024-06-17 – 2024-06-18 (×4): 1000 mg via INTRAVENOUS
  Filled 2024-06-17 (×3): qty 100

## 2024-06-17 MED ORDER — TRAZODONE HCL 100 MG PO TABS
100.0000 mg | ORAL_TABLET | Freq: Every evening | ORAL | Status: DC | PRN
  Administered 2024-06-18: 100 mg via ORAL
  Filled 2024-06-17: qty 1

## 2024-06-17 MED ORDER — ONDANSETRON HCL 4 MG/2ML IJ SOLN
INTRAMUSCULAR | Status: AC
  Filled 2024-06-17: qty 2

## 2024-06-17 SURGICAL SUPPLY — 71 items
BAG COUNTER SPONGE SURGICOUNT (BAG) IMPLANT
BAG LAPAROSCOPIC 12 15 PORT 16 (BASKET) ×2 IMPLANT
BAG URINE DRAIN 2000ML AR STRL (UROLOGICAL SUPPLIES) IMPLANT
BAG URO CATCHER STRL LF (MISCELLANEOUS) ×2 IMPLANT
CATH FOLEY 3WAY 5CC 18FR (CATHETERS) IMPLANT
CAUTERY HOOK MNPLR 1.6 DVNC XI (INSTRUMENTS) ×2 IMPLANT
CHLORAPREP W/TINT 26 (MISCELLANEOUS) ×2 IMPLANT
CLIP LIGATING HEM O LOK PURPLE (MISCELLANEOUS) ×2 IMPLANT
CLIP LIGATING HEMO LOK XL GOLD (MISCELLANEOUS) ×2 IMPLANT
CLIP LIGATING HEMO O LOK GREEN (MISCELLANEOUS) ×2 IMPLANT
COVER SURGICAL LIGHT HANDLE (MISCELLANEOUS) ×2 IMPLANT
COVER TIP SHEARS 8 DVNC (MISCELLANEOUS) ×2 IMPLANT
DERMABOND ADVANCED .7 DNX12 (GAUZE/BANDAGES/DRESSINGS) ×4 IMPLANT
DRAIN CHANNEL 15F RND FF 3/16 (WOUND CARE) ×2 IMPLANT
DRAPE ARM DVNC X/XI (DISPOSABLE) ×8 IMPLANT
DRAPE COLUMN DVNC XI (DISPOSABLE) ×2 IMPLANT
DRAPE FOOT SWITCH (DRAPES) ×2 IMPLANT
DRAPE INCISE IOBAN 66X45 STRL (DRAPES) ×2 IMPLANT
DRAPE SHEET LG 3/4 BI-LAMINATE (DRAPES) ×2 IMPLANT
DRIVER NDL LRG 8 DVNC XI (INSTRUMENTS) ×4 IMPLANT
DRIVER NDLE LRG 8 DVNC XI (INSTRUMENTS) ×4 IMPLANT
DRSG TEGADERM 4X4.75 (GAUZE/BANDAGES/DRESSINGS) ×2 IMPLANT
ELECT HOOK LOOP BIPOLAR (NEEDLE) ×2 IMPLANT
ELECT PENCIL ROCKER SW 15FT (MISCELLANEOUS) ×2 IMPLANT
ELECT REM PT RETURN 15FT ADLT (MISCELLANEOUS) ×2 IMPLANT
EVACUATOR SILICONE 100CC (DRAIN) ×2 IMPLANT
FORCEPS PROGRASP DVNC XI (FORCEP) ×4 IMPLANT
GLOVE BIO SURGEON STRL SZ 6.5 (GLOVE) ×2 IMPLANT
GLOVE BIOGEL PI IND STRL 8 (GLOVE) ×4 IMPLANT
GLOVE SURG LX STRL 8.0 MICRO (GLOVE) ×4 IMPLANT
GOWN STRL REUS W/ TWL XL LVL3 (GOWN DISPOSABLE) ×6 IMPLANT
GOWN STRL SURGICAL XL XLNG (GOWN DISPOSABLE) ×2 IMPLANT
GUIDEWIRE ZIPWRE .038 STRAIGHT (WIRE) ×2 IMPLANT
IRRIGATION SUCT STRKRFLW 2 WTP (MISCELLANEOUS) ×2 IMPLANT
KIT BASIN OR (CUSTOM PROCEDURE TRAY) ×2 IMPLANT
KIT TURNOVER KIT A (KITS) ×2 IMPLANT
LOOP CUT BIPOLAR 24F LRG (ELECTROSURGICAL) IMPLANT
LOOP VESSEL MAXI BLUE (MISCELLANEOUS) IMPLANT
MANIFOLD NEPTUNE II (INSTRUMENTS) ×2 IMPLANT
NDL INSUFFLATION 14GA 120MM (NEEDLE) ×2 IMPLANT
NEEDLE INSUFFLATION 14GA 120MM (NEEDLE) ×2 IMPLANT
NS IRRIG 1000ML POUR BTL (IV SOLUTION) ×2 IMPLANT
PACK CYSTO (CUSTOM PROCEDURE TRAY) ×2 IMPLANT
PLUG CATH AND CAP STRL 200 (CATHETERS) IMPLANT
PROTECTOR NERVE ULNAR (MISCELLANEOUS) ×4 IMPLANT
RELOAD STAPLE 45 2.6 WHT THIN (STAPLE) IMPLANT
SCISSORS LAP 5X45 EPIX DISP (ENDOMECHANICALS) ×2 IMPLANT
SCISSORS MNPLR CVD DVNC XI (INSTRUMENTS) ×2 IMPLANT
SEAL UNIV 5-12 XI (MISCELLANEOUS) ×6 IMPLANT
SET TUBE SMOKE EVAC HIGH FLOW (TUBING) ×2 IMPLANT
SOLUTION ELECTROSURG ANTI STCK (MISCELLANEOUS) ×2 IMPLANT
SPIKE FLUID TRANSFER (MISCELLANEOUS) ×2 IMPLANT
STAPLER POWER ECHELON 45 WIDE (STAPLE) IMPLANT
STENT URET 6FRX24 CONTOUR (STENTS) IMPLANT
SUT ETHILON 3 0 PS 1 (SUTURE) ×2 IMPLANT
SUT MNCRL AB 4-0 PS2 18 (SUTURE) ×4 IMPLANT
SUT PDS AB 0 CT 36 (SUTURE) ×4 IMPLANT
SUT VIC AB 0 CT1 27XBRD ANTBC (SUTURE) ×2 IMPLANT
SUT VIC AB 2-0 SH 27X BRD (SUTURE) IMPLANT
SUT VICRYL 0 UR6 27IN ABS (SUTURE) ×2 IMPLANT
SUTURE V-LC BRB 180 2/0GR6GS22 (SUTURE) IMPLANT
SUTURE VLOC BRB 180 ABS3/0GR12 (SUTURE) IMPLANT
SYRINGE TOOMEY IRRIG 70ML (MISCELLANEOUS) IMPLANT
TOWEL OR 17X26 10 PK STRL BLUE (TOWEL DISPOSABLE) IMPLANT
TRAY FOLEY MTR SLVR 16FR STAT (SET/KITS/TRAYS/PACK) ×2 IMPLANT
TRAY LAPAROSCOPIC (CUSTOM PROCEDURE TRAY) ×2 IMPLANT
TROCAR Z THREAD OPTICAL 12X100 (TROCAR) IMPLANT
TROCAR Z-THREAD OPTICAL 5X100M (TROCAR) ×2 IMPLANT
TUBING CONNECTING 10 (TUBING) ×2 IMPLANT
TUBING UROLOGY SET (TUBING) ×2 IMPLANT
WATER STERILE IRR 1000ML POUR (IV SOLUTION) ×4 IMPLANT

## 2024-06-17 NOTE — Plan of Care (Signed)

## 2024-06-17 NOTE — Op Note (Signed)
 Operative Note  Preoperative diagnosis:  1.  High-grade urothelial carcinoma of the left UPJ and renal pelvis  Postoperative diagnosis: Same  Procedure(s): 1.  Cystoscopy with left ureteral stent placement and TUR of the left ureteral orifice 2.  Robot-assisted laparoscopic left nephro ureterectomy  Surgeon: Lonni Han, MD  Assistants:  None  Anesthesia:  General  Complications:  None  EBL: 100 mL  Specimens: 1.  Left kidney and ureter  Drains/Catheters: 1.  18 French three-way Foley catheter with 10 mL of sterile water  in balloon 2.  Left lower quadrant JP drain  Intraoperative findings:   No intravesical or urethral abnormalities were seen during cystoscopy Grossly negative surgical margins The cystorrhaphy was watertight following a 300 mL fill test  Indication:  Alexandra Morrison is a 78 y.o. female with high-grade urothelial carcinoma involving the left UPJ and renal pelvis.  She has been consented for the above procedures, voices understanding and wishes to proceed.  Description of procedure:  After informed consent was signed, the patient was taken back to the operating room and properly anesthetized.    He was placed in the dorsolithotomy position and prepped and draped in usual sterile fashion.  A timeout was then performed.  A 23 French rigid cystoscope was then inserted into the urethra and advanced into the bladder.  A complete bladder survey revealed no intravesical or urethral abnormalities.  A Glidewire was then advanced up the right ureter and up to the right renal pelvis.  A 6 French, 24 cm ureteral stent was then advanced over the wire and into position within the right collecting system.  The the rigid cystoscope was then exchanged for a 26 [place] resectoscope with a monopolar [employer] working Radiographer, therapeutic.  The left ureteral orifice was then circumferentially incised down to perivesical fat.  An 40 French three-way Foley catheter was then sterilely  placed and the bladder was set to gravity drainage.  The patient was then placed in the right lateral decubitus position and prepped and draped in usual sterile fashion.  A timeout was performed.  An 8 mm incision was then made lateral to the left rectus muscle at the level of the left 12th rib.  Abdominal access was obtained via a Veress needle.  The abdominal cavity was then insufflated up to 15 mmHg.  An 8 mm port was then introduced into the abdominal cavity.  Inspection of the port entry site by the robotic camera revealed no adjacent organ injury.  We then placed 3 additional 8 mm robotic ports to triangulate the left renal hilum.  A 12 mm assistant port was then placed between the carmera port and 3rd robotic arm.  The white line of Toldt along the descending colon was incised sharply and the colon, along with its mesocolonic fat, was reflected medially until the aorta was identified.  We then made a small window adjacent to the lower pole of the left kidney, identifying the left psoas muscle, left ureter and left gonadal vein.  The left ureter and gonadal vein were then reflected anteriorly allowing us  to then incised the perihilar attachments using electrocautery.  We encountered a small lumbar vein adjacent to the insertion of the left gonadal vein into the left renal vein.  This lumbar vein was ligated with hemo-lock clips in 2 places and incised sharply.  This provided us  excellent exposure to the left renal hilum.    A 45 mm endovascular stapler was then used to ligate the left renal artery and  then the left renal vein, achieving excellent hemostasis.  The remaining peri-renal attachments were then excised using a combination of blunt dissection and electrocautery.  The left adrenal gland was taken with the left kidney.    The left ureter was then traced down into the pelvis all the way to the bladder hiatus.  The left ureter was then detached from the bladder hiatus and the aperture of the  bladder was closed using the 3-0 V lock suture.  The bladder was then filled with 300 mL of continuous irrigation and no evidence of a leak could be identified.  Continuous bladder irrigation was then stopped and the Foley was placed back to gravity drainage.  The left kidney and ureter were then placed within an Endo Catch bag.  A 10 [place] JP drain was then placed in the pelvis and sutured into place using 2-0 nylon suture.  The robot was then de-docked.  A left lower quadrant Gibson incision was then made and the left kidney and ureter were removed within the Endo Catch bag.  The fascia within the midline assistant port was then closed using an interrupted 0 Vicryl suture.  The fascia of the internal and external oblique was then closed using a 0 PDS suture in a running fashion.  The subcutaneous tissue within the [facility] incision was then closed using a running 0 Vicryl suture.  All skin incisions were then closed using 4-0 Monocryl and then dressed with Dermabond.  The patient tolerated the procedure well and was transferred to the postanesthesia in stable condition.     Plan:  Monitor on the floor

## 2024-06-17 NOTE — H&P (Signed)
 Office Visit Report     06/04/2024   --------------------------------------------------------------------------------   Alexandra Morrison  MRN: [id number]  DOB: 1945/12/09, 78 year old Female  SSN:    PRIMARY CARE:  Garnette BROCKS. Nanci [facility]), MD  PRIMARY CARE FAX:  [phone]  REFERRING:  Lonni LABOR. Devere, MD  PROVIDER:  Lonni Devere, M.D.  TREATING:  [employer]  LOCATION:  [facility] [phone]     --------------------------------------------------------------------------------   CC/HPI: Left ureteral lesion and hydronephrosis   Alexandra Morrison is a 78 year old female who was found to have left-sided hydronephrosis as well as a focal area of narrowing within the proximal ureter/UPJ accompanied by faint urothelial thickening of the collecting system concerning for an underlying lesion on CT abdomen/pelvis with contrast from 01/28/2024 during an evaluation for lower abdominal pain.   -Previously smoked 1 ppd for 23 years--quit 22 years ago  -Recently treated for a gastric ulcers  -She denies any recent episodes of flank pain or gross hematuria.  -No personal/family history of GU malignancies  -No prior GU surgery/trauma  -No history of kidney stones or recurrent UTIs   05/04/24: The patient presents back today for routine postop visit following a diagnostic ureteroscopy on 04/28/2024 where she was found to have extensive papillary tumor burden involving the left renal pelvis with features highly concerning for urothelial cell carcinoma. Her left renal pelvis washings are still pending. She currently has a left ureteral stent in place due to extensive stricture disease throughout the mid and proximal aspects of the left ureter. She reports positional discomfort and urinary urgency, but denies any flank pain or residual hematuria. She is accompanied by her husband during today's visit.   05/12/24: The patient is here today for a routine follow-up for cystoscopy  and left ureteral stent removal prior to her left nephroureterectomy in August. Her left renal pelvis washings came back positive for high grade urothelial carcinoma. She reports ongoing urinary urgency and frequency q 1-2 hours, but denies dysuria, flank pain or residual hematuria.   06/04/2024:  Alexandra Morrison is here today for preop evaluation prior to having left nephro ureterectomy on August 27 with Dr. Devere due to high-grade urothelial carcinoma found in her left kidney. The patient denies any new shortness of breath or chest pain. Cardiology has provided clearance noted in Dr. Ginette note from 05/20/2024. The patient denies any recent fevers or illnesses. She does not know of any new medical diagnoses or medications that she has been started on since her last appointment. Patient is not on anticoagulation.     ALLERGIES: None   MEDICATIONS: Levothyroxine  Sodium  Metoprolol  Tartrate 25 MG Tablet  Abilify   Adderall 30 MG Tablet  ALPRAZolam   Calcium  Divalproex  Sodium 500 MG Tablet Delayed Release  Furosemide  20 MG Tablet  Iron  Lexapro   Lovastatin  Meclizine HCl 25 MG Tablet  Methocarbamol  500 MG Tablet  Multivitamin  Pantoprazole  Sodium 40 MG Tablet Delayed Release  Potassium Chloride   Vitamin D3  Wellbutrin  XL 300 MG Tablet Extended Release 24 Hour     GU PSH: Cysto Remove Stent FB Sim - 05/12/2024       PSH Notes: 04/28/2024  Cystoscopy with stent placement (Left)  04/28/2024  Cystoscopy w/ ureteral stent placement (Left)  09/14/2019  Esophagogastroduodenoscopy (egd) with propofol  (N/A)  09/14/2019  Colonoscopy with propofol  (N/A)  03/12/2018  Total knee arthroplasty (Left)  07/11/2017  Total knee arthroplasty (Right)  11/25/2015  Posterior cervical fusion/foraminotomy (N/A)  2006  Gastric  bypass  2006  Eye surgery (Bilateral)  Date Unknown  Appendectomy  Date Unknown  Carpal tunnel release (Bilateral)  1971, 1973  Cesarean section  Date Unknown  Fracture surgery   2010, 2011  Total shoulder arthroplasty (Bilateral)     NON-GU PSH: None   GU PMH: Renal pelvis cancer, left - 05/12/2024, - 05/11/2024, - 05/04/2024 Hydronephrosis - 05/04/2024, - 04/17/2024      PMH Notes: Digestive  GI bleed   Endocrine  Hypothyroidism   Musculoskeletal and Integument  Primary osteoarthritis of right knee  Right knee DJD  Left knee DJD  DDD (degenerative disc disease), lumbar   Other  S/P cervical spinal fusion  Spinal stenosis  Symptomatic anemia  Macrocytosis  HLD (hyperlipidemia)  Anxiety  Encounter to establish care with new doctor  Murmur  Mixed hyperlipidemia      NON-GU PMH: Anxiety Arthritis Cardiac murmur, unspecified Depression Diabetes Type 2 GERD Sleep Apnea    FAMILY HISTORY: 1 Daughter - Daughter 1 son - Son Arthritis - Runs in Family COPD (chronic obstructive pulmonary disease) with - Brother Heart Disease - Runs in Family   SOCIAL HISTORY: Marital Status: Married Preferred Language: English; Race: White Current Smoking Status: Patient does not smoke anymore.   Tobacco Use Assessment Completed: Used Tobacco in last 30 days? Has never drank.  Drinks 3 caffeinated drinks per day.    REVIEW OF SYSTEMS:    GU Review Female:   Patient reports get up at night to urinate. Patient denies being pregnant, stream starts and stops, frequent urination, trouble starting your stream, hard to postpone urination, have to strain to urinate, burning /pain with urination, and leakage of urine.  Gastrointestinal (Upper):   Patient denies nausea, vomiting, and indigestion/ heartburn.  Gastrointestinal (Lower):   Patient denies diarrhea and constipation.  Constitutional:   Patient reports night sweats. Patient denies fever, weight loss, and fatigue.  Skin:   Patient denies skin rash/ lesion and itching.  Eyes:   Patient denies blurred vision and double vision.  Ears/ Nose/ Throat:   Patient denies sore throat and sinus problems.   Hematologic/Lymphatic:   Patient denies swollen glands and easy bruising.  Cardiovascular:   Patient denies leg swelling and chest pains.  Respiratory:   Patient denies cough and shortness of breath.  Endocrine:   Patient denies excessive thirst.  Musculoskeletal:   Patient reports back pain and joint pain.   Neurological:   Patient denies headaches and dizziness.  Psychologic:   Patient denies depression and anxiety.   Notes: bladder pressure     VITAL SIGNS:      06/04/2024 10:39 AM  BP 109/70 mmHg  Pulse 78 /min  Temperature 98.0 F / 36.6 C   GU PHYSICAL EXAMINATION:    Breast: Symmetrical. No tenderness, no nipple discharge, no skin changes. No mass.  Digital Rectal Exam: Normal sphincter tone. No rectal mass.  External Genitalia: No hirsutism, no rash, no scarring, no cyst, no erythematous lesion, no papular lesion, no blanched lesion, no warty lesion. No edema.  Urethral Meatus: Normal size. Normal position. No discharge.  Urethra: No tenderness, no mass, no scarring. No hypermobility. No leakage.  Bladder: Normal to palpation, no tenderness, no mass, normal size.  Vagina: No atrophy, no stenosis. No rectocele. No cystocele. No enterocele.  Cervix: No inflammation, no discharge, no lesion, no tenderness, no wart.  Uterus: Normal size. Normal consistency. Normal position. No mobility. No descent.  Adnexa / Parametria: No tenderness. No adnexal mass. Normal left  ovary. Normal right ovary.  Anus and Perineum: No hemorrhoids. No anal stenosis. No rectal fissure, no anal fissure. No edema, no dimple, no perineal tenderness, no anal tenderness.   MULTI-SYSTEM PHYSICAL EXAMINATION:    Constitutional: Well-nourished. No physical deformities. Normally developed. Good grooming.  Neck: Neck symmetrical, not swollen. Normal tracheal position.  Respiratory: No labored breathing, no use of accessory muscles.   Cardiovascular: Normal temperature, normal extremity pulses, no swelling, no  varicosities.  Lymphatic: No enlargement of neck, axillae, groin.  Skin: No paleness, no jaundice, no cyanosis. No lesion, no ulcer, no rash.  Neurologic / Psychiatric: Oriented to time, oriented to place, oriented to person. No depression, no anxiety, no agitation.  Gastrointestinal: No mass, no tenderness, no rigidity, non obese abdomen.  Eyes: Normal conjunctivae. Normal eyelids.  Ears, Nose, Mouth, and Throat: Left ear no scars, no lesions, no masses. Right ear no scars, no lesions, no masses. Nose no scars, no lesions, no masses. Normal hearing. Normal lips.  Musculoskeletal: Normal gait and station of head and neck.     Complexity of Data:  Source Of History:  Patient, Medical Record Summary  Records Review:   Previous Doctor Records, Previous Hospital Records, Previous Patient Records  Urine Test Review:   Urinalysis, Urine Culture   PROCEDURES:          Visit Complexity - G2211          Urinalysis w/Scope - 81001 Dipstick Dipstick Cont'd Micro  Color: Yellow Bilirubin: Neg mg/dL WBC/hpf: 10 - 79/yeq  Appearance: Slightly Cloudy Ketones: Neg mg/dL RBC/hpf: 3 - 89/yeq  Specific Gravity: 1.020 Blood: 3+ ery/uL Bacteria: Few (10-25/hpf)  pH: 6.0 Protein: 1+ mg/dL Cystals: NS (Not Seen)  Glucose: Neg mg/dL Urobilinogen: 0.2 mg/dL Casts: NS (Not Seen)    Nitrites: Neg Trichomonas: Not Present    Leukocyte Esterase: 2+ leu/uL Mucous: Not Present      Epithelial Cells: 0 - 5/hpf      Yeast: NS (Not Seen)      Sperm: Not Present    ASSESSMENT:      ICD-10 Details  1 GU:   Renal pelvis cancer, left - C65.2 Chronic, Worsening  2   Hydronephrosis - N13.0 Chronic, Worsening   PLAN:           Orders Labs Urine Culture          Schedule Return Visit/Planned Activity: Keep Scheduled Appointment          Document Letter(s):  Created for Patient: Clinical Summary         Notes:   The patient is doing well today. She is anxious about her upcoming surgery. We talked a lot  about her postoperative expectations. I will send her urine for culture and follow-up accordingly. All questions were answered to the best of my ability.   The risks, benefits and alternatives of cystoscopy with TUR of the LEFT ureteral orifice and robot-assisted laparoscopic LEFT nephroureterectomy were discussed in detail including but not limited to: negative pathology, open conversion, infection of the skin/abdominal cavity, VTE, MI/CVA, lymphatic leak, injury to adjacent solid/hollow viscus organs, bleeding requiring a blood transfusion, catastrophic bleeding, bladder perforation hernia formation and other imponderables. The patient voices understanding and wishes to proceed.

## 2024-06-17 NOTE — Transfer of Care (Signed)
 Immediate Anesthesia Transfer of Care Note  Patient: Alexandra Morrison  Procedure(s) Performed: NEPHROURETERECTOMY, ROBOT-ASSISTED, LAPAROSCOPIC (Left: Abdomen) CYSTOSCOPY, WITH RESECTION OF THE LEFT URETERAL ORIFICE (Left: Bladder)  Patient Location: PACU  Anesthesia Type:General  Level of Consciousness: drowsy  Airway & Oxygen Therapy: Patient Spontanous Breathing and Patient connected to face mask oxygen  Post-op Assessment: Report given to RN and Post -op Vital signs reviewed and stable  Post vital signs: Reviewed and stable  Last Vitals:  Vitals Value Taken Time  BP 138/61 06/17/24 16:20  Temp    Pulse 77 06/17/24 16:25  Resp 18 06/17/24 16:25  SpO2 97 % 06/17/24 16:25  Vitals shown include unfiled device data.  Last Pain:  Vitals:   06/17/24 1113  TempSrc:   PainSc: 0-No pain         Complications: No notable events documented.

## 2024-06-17 NOTE — Anesthesia Procedure Notes (Signed)
 Procedure Name: Intubation Date/Time: 06/17/2024 1:35 PM  Performed by: Gladis Honey, CRNAPre-anesthesia Checklist: Patient identified, Emergency Drugs available, Suction available and Patient being monitored Patient Re-evaluated:Patient Re-evaluated prior to induction Oxygen Delivery Method: Circle System Utilized Preoxygenation: Pre-oxygenation with 100% oxygen Induction Type: IV induction Ventilation: Mask ventilation without difficulty Laryngoscope Size: Glidescope and 3 Grade View: Grade I Tube type: Oral Tube size: 7.0 mm Number of attempts: 1 Airway Equipment and Method: Stylet and Oral airway Placement Confirmation: ETT inserted through vocal cords under direct vision, positive ETCO2 and breath sounds checked- equal and bilateral Secured at: 20 cm Tube secured with: Tape Dental Injury: Teeth and Oropharynx as per pre-operative assessment  Comments: Intubated per paramedic under direct supervision of MDA

## 2024-06-17 NOTE — Discharge Instructions (Signed)

## 2024-06-18 ENCOUNTER — Encounter (HOSPITAL_COMMUNITY): Payer: Self-pay | Admitting: Urology

## 2024-06-18 LAB — BASIC METABOLIC PANEL WITH GFR
Anion gap: 11 (ref 5–15)
BUN: 19 mg/dL (ref 8–23)
CO2: 24 mmol/L (ref 22–32)
Calcium: 8.2 mg/dL — ABNORMAL LOW (ref 8.9–10.3)
Chloride: 107 mmol/L (ref 98–111)
Creatinine, Ser: 1.41 mg/dL — ABNORMAL HIGH (ref 0.44–1.00)
GFR, Estimated: 38 mL/min — ABNORMAL LOW (ref >60)
Glucose, Bld: 80 mg/dL (ref 70–99)
Potassium: 4.9 mmol/L (ref 3.5–5.1)
Sodium: 143 mmol/L (ref 135–145)

## 2024-06-18 LAB — CREATININE, FLUID (PLEURAL, PERITONEAL, JP DRAINAGE): Creat, Fluid: 1.5 mg/dL

## 2024-06-18 LAB — HEMOGLOBIN AND HEMATOCRIT, BLOOD
HCT: 34.7 % — ABNORMAL LOW (ref 36.0–46.0)
Hemoglobin: 10.5 g/dL — ABNORMAL LOW (ref 12.0–15.0)

## 2024-06-18 MED ORDER — LACTATED RINGERS IV BOLUS
500.0000 mL | Freq: Once | INTRAVENOUS | Status: AC
  Administered 2024-06-18: 500 mL via INTRAVENOUS

## 2024-06-18 NOTE — TOC Initial Note (Signed)
 Transition of Care [facility]) - Initial/Assessment Note    Patient Details  Name: Alexandra Morrison MRN: [id number] Date of Birth: August 06, 1946  Transition of Care [facility]) CM/SW Contact:    Bascom Service, RN Phone Number: 06/18/2024, 11:50 AM  Clinical Narrative:  d/c plan home.                 Expected Discharge Plan: Home/Self Care Barriers to Discharge: Continued Medical Work up   Patient Goals and CMS Choice            Expected Discharge Plan and Services                                              Prior Living Arrangements/Services                       Activities of Daily Living   ADL Screening (condition at time of admission) Independently performs ADLs?: Yes (appropriate for developmental age) Is the patient deaf or have difficulty hearing?: No Does the patient have difficulty seeing, even when wearing glasses/contacts?: No Does the patient have difficulty concentrating, remembering, or making decisions?: No  Permission Sought/Granted                  Emotional Assessment              Admission diagnosis:  Malignant neoplasm of left renal pelvis (HCC) [C65.2] Urothelial cancer (HCC) [C68.9] Patient Active Problem List   Diagnosis Date Noted   Urothelial cancer (HCC) 06/17/2024   Encounter to establish care with new doctor 03/09/2022   Murmur 03/09/2022   Mixed hyperlipidemia 03/09/2022   GI bleed 09/12/2019   Hypothyroidism    Spinal stenosis    Symptomatic anemia    Macrocytosis    HLD (hyperlipidemia)    Anxiety    DDD (degenerative disc disease), lumbar    Left knee DJD 03/12/2018   Primary osteoarthritis of right knee 07/11/2017   Right knee DJD 07/11/2017   S/P cervical spinal fusion 11/25/2015   PCP:  Nanci Senior, MD Pharmacy:   [facility] [place], [place] - [address] [place] [address] Phone: [phone] Fax: [phone]     Social Drivers of Health  (SDOH) Social History: SDOH Screenings   Food Insecurity: No Food Insecurity (06/17/2024)  Housing: Low Risk  (06/17/2024)  Transportation Needs: No Transportation Needs (06/17/2024)  Utilities: Not At Risk (06/17/2024)  Social Connections: Patient Declined (06/17/2024)  Tobacco Use: Medium Risk (06/17/2024)   SDOH Interventions:     Readmission Risk Interventions     No data to display

## 2024-06-18 NOTE — Progress Notes (Addendum)
 1 Day Post-Op Subjective: The patient is doing well.  No nausea or vomiting. Pain is adequately controlled.  Objective: Vital signs in last 24 hours: Temp:  [97.2 F (36.2 C)-98.9 F (37.2 C)] 98.8 F (37.1 C) (08/28 0628) Pulse Rate:  [75-87] 75 (08/28 0646) Resp:  [11-17] 16 (08/28 0628) BP: (96-145)/(30-71) 108/60 (08/28 0646) SpO2:  [90 %-99 %] 98 % (08/28 0628) Weight:  [62.6 kg] 62.6 kg (08/27 1113)  Intake/Output from previous day: 08/27 0701 - 08/28 0700 In: 1986.8 [I.V.:1686.8; IV Piggyback:300] Out: 620 [Urine:300; Drains:220; Blood:100] Intake/Output this shift: No intake/output data recorded.  Physical Exam:  General: Alert and oriented. CV: RRR Lungs: Clear bilaterally. GI: Soft, Nondistended. Incisions: Clean and dry. Urine: Clear Extremities: Nontender, no erythema, no edema.  Lab Results: Recent Labs    06/17/24 1720 06/18/24 0504  HGB 11.9* 10.5*  HCT 38.7 34.7*          Recent Labs    06/18/24 0504  CREATININE 1.41*           Results for orders placed or performed during the hospital encounter of 06/17/24 (from the past 24 hours)  Glucose, capillary     Status: None   Collection Time: 06/17/24 10:34 AM  Result Value Ref Range   Glucose-Capillary 83 70 - 99 mg/dL  Glucose, capillary     Status: Abnormal   Collection Time: 06/17/24  4:22 PM  Result Value Ref Range   Glucose-Capillary 180 (H) 70 - 99 mg/dL   Comment 1 Notify RN    Comment 2 Document in Chart   Hemoglobin and hematocrit, blood     Status: Abnormal   Collection Time: 06/17/24  5:20 PM  Result Value Ref Range   Hemoglobin 11.9 (L) 12.0 - 15.0 g/dL   HCT 61.2 63.9 - 53.9 %  Basic metabolic panel     Status: Abnormal   Collection Time: 06/18/24  5:04 AM  Result Value Ref Range   Sodium 143 135 - 145 mmol/L   Potassium 4.9 3.5 - 5.1 mmol/L   Chloride 107 98 - 111 mmol/L   CO2 24 22 - 32 mmol/L   Glucose, Bld 80 70 - 99 mg/dL   BUN 19 8 - 23 mg/dL   Creatinine, Ser 8.58  (H) 0.44 - 1.00 mg/dL   Calcium 8.2 (L) 8.9 - 10.3 mg/dL   GFR, Estimated 38 (L) >60 mL/min   Anion gap 11 5 - 15  Hemoglobin and hematocrit, blood     Status: Abnormal   Collection Time: 06/18/24  5:04 AM  Result Value Ref Range   Hemoglobin 10.5 (L) 12.0 - 15.0 g/dL   HCT 65.2 (L) 63.9 - 53.9 %    Assessment/Plan: POD# 1 s/p left nephU  -JP Cr -- will remove drain if negative  -Ambulate, Incentive spirometry -ML, continue CLD -- consider advancement if tolerating appropriately  -Monitor UoP  -Continue Foley catheter -- likely TOV closer to discharge -Daily labs -MMPC  Lyle GEANNIE Civil, MD Resident, PGY4 Department of Urology

## 2024-06-18 NOTE — Evaluation (Signed)
 Occupational Therapy Evaluation Patient Details Name: Alexandra Morrison MRN: [id number] DOB: 1946-04-09 Today's Date: 06/18/2024   History of Present Illness   78 yo female admitted 06/17/24 with diagnosis of  High-grade urothelial carcinoma of the left UPJ and renal pelvis,S/P NEPHROURETERECTOMY, LAPAROSCOPIC (Left: Abdomen)  CYSTOSCOPY, WITH RESECTION OF THE LEFT URETERAL ORIFICE on 06/17/24. EFY:ybenuybmnpipdf, anxiety , depression, bipolar DO, hydronephrosis, bilateral TKA, Bilateral TSA, sleep apnea, myelopathy, spinal stenosis.     Clinical Impressions PTA, patient lives independently at home with husband reporting she did occasionally use SPC on uneven terrain occasionally, drives, has 4 cats and has a long hx (since age 78) of undiagnosed motor issues with + B hand tremors. Also has hx of B TSA but R side end range deficits only.  Currently, patient presents with deficits outlined below (see OT Problem List for details) most significantly pain, new Foley catheter, R shoulder decreased ROM, B UE coordination, LB muscle weakness, balance, activity tolerance, higher level cognitive deficits limiting BADL's and functional mobility.  Patient requires continued Acute care hospital level OT services to progress safety and functional performance and allow for discharge. Anticipate with continued hospital based therapy progression and family support and assist patient would benefit from [facility] services upon discharge.       If plan is discharge home, recommend the following:   A lot of help with walking and/or transfers;A lot of help with bathing/dressing/bathroom;Assistance with cooking/housework;Direct supervision/assist for medications management;Direct supervision/assist for financial management;Assist for transportation;Help with stairs or ramp for entrance;Supervision due to cognitive status     Functional Status Assessment   Patient has had a recent decline in their functional status and  demonstrates the ability to make significant improvements in function in a reasonable and predictable amount of time.     Equipment Recommendations   None recommended by OT      Precautions/Restrictions   Precautions Precautions: Fall Recall of Precautions/Restrictions: Impaired (did not recall Foley was in place) Precaution/Restrictions Comments: left drain( since removed) Restrictions Weight Bearing Restrictions Per Provider Order: No     Mobility Bed Mobility Overal bed mobility: Needs Assistance Bed Mobility: Sit to Supine       Sit to supine: Min assist   General bed mobility comments: mod cues to use rails and bed features to scoot up the bed    Transfers Overall transfer level: Needs assistance Equipment used: 1 person hand held assist Transfers: Sit to/from Stand Sit to Stand: Mod assist           General transfer comment: STS from EOB x 3 trials with mod A and lateral steps toward HOB, poor insight as patient was flopped back from EOB eating with pillow behind head with little awareness  of poor positioning and Foley on otherside of bed      Balance Overall balance assessment: Needs assistance Sitting-balance support: Feet supported, Bilateral upper extremity supported Sitting balance-Leahy Scale: Fair     Standing balance support: Single extremity supported, During functional activity Standing balance-Leahy Scale: Poor                             ADL either performed or assessed with clinical judgement   ADL Overall ADL's : Needs assistance/impaired Eating/Feeding: Set up;Sitting Eating/Feeding Details (indicate cue type and reason): EOB Grooming: Wash/dry hands;Wash/dry face;Oral care;Sitting;Cueing for sequencing;Contact guard assist Grooming Details (indicate cue type and reason): fatique prevented prolonged EOB and patient requested to rest back intermittently Upper  Body Bathing: Minimal assistance;Sitting   Lower Body  Bathing: Moderate assistance;Sitting/lateral leans   Upper Body Dressing : Minimal assistance;Sitting   Lower Body Dressing: Moderate assistance;Sitting/lateral leans   Toilet Transfer:  (TBD as patietn declined commode txfr this sesison d/t fatigue)   Toileting- Clothing Manipulation and Hygiene: Maximal assistance Toileting - Clothing Manipulation Details (indicate cue type and reason): has Foley in place for urine management     Functional mobility during ADLs: Moderate assistance;Rolling walker (2 wheels) General ADL Comments: fatigue and decreased processing with cues for safety needed     Vision Baseline Vision/History: 1 Wears glasses;0 No visual deficits              Pertinent Vitals/Pain Pain Assessment Pain Assessment: Faces Faces Pain Scale: Hurts little more Pain Location: left abdomen Pain Descriptors / Indicators: Discomfort, Guarding Pain Intervention(s): Limited activity within patient's tolerance, Monitored during session, Repositioned     Extremity/Trunk Assessment Upper Extremity Assessment Upper Extremity Assessment: Right hand dominant;RUE deficits/detail;Overall WFL for tasks assessed;LUE deficits/detail RUE Deficits / Details: hx of B RTC repairs but R UE limited end range 0-125, also reports long standing NM d/o undiagnossed since age 78 with intermittent B  tremors but at baseline RUE Coordination: decreased fine motor LUE Coordination: decreased fine motor   Lower Extremity Assessment Lower Extremity Assessment: Defer to PT evaluation   Cervical / Trunk Assessment Cervical / Trunk Assessment: Normal   Communication Communication Communication: No apparent difficulties   Cognition Arousal: Lethargic Behavior During Therapy: WFL for tasks assessed/performed Cognition: Cognition impaired   Orientation impairments: Time Awareness: Online awareness impaired Memory impairment (select all impairments): Short-term memory Attention impairment  (select first level of impairment): Sustained attention Executive functioning impairment (select all impairments): Problem solving OT - Cognition Comments: improved once more alert but does report feeling slower to process info due to fatigue post op                 Following commands: Intact       Cueing  General Comments   Cueing Techniques: Verbal cues  small incisions on abdomen and trunkintact and drain removal incision covered, on 2 ltrs O2 via [place] with SpO2 98% during therapy session bedside           Home Living Family/patient expects to be discharged to:: Private residence Living Arrangements: Spouse/significant other Available Help at Discharge: Family Type of Home: House Home Access: Stairs to enter Secretary/administrator of Steps: 1+1   Home Layout: One level     Bathroom Shower/Tub: Walk-in shower;Tub only   Bathroom Toilet: Standard Bathroom Accessibility: Yes How Accessible: Accessible via walker Home Equipment: Rolling Walker (2 wheels);BSC/3in1;Shower seat;Grab bars - tub/shower;Hand held shower head;Adaptive equipment;Cane - single point Adaptive Equipment: Reacher Additional Comments: has swim spa in yard and uses to soak but does not swim in it d/t depth      Prior Functioning/Environment Prior Level of Function : Independent/Modified Independent             Mobility Comments: occasionally used SPC on uneven terrain ADLs Comments: drives, has 4 cats    OT Problem List: Decreased strength;Decreased range of motion;Decreased activity tolerance;Impaired balance (sitting and/or standing);Decreased coordination;Decreased cognition;Decreased safety awareness;Decreased knowledge of use of DME or AE;Decreased knowledge of precautions;Cardiopulmonary status limiting activity;Pain   OT Treatment/Interventions: Self-care/ADL training;Therapeutic exercise;Neuromuscular education;Energy conservation;DME and/or AE instruction;Therapeutic  activities;Cognitive remediation/compensation;Patient/family education;Balance training      OT Goals(Current goals can be found in the care plan section)  Acute Rehab OT Goals Patient Stated Goal: to feel stronger OT Goal Formulation: With patient Time For Goal Achievement: 07/02/24 Potential to Achieve Goals: Good ADL Goals Pt Will Perform Lower Body Bathing: with supervision;sit to/from stand;with adaptive equipment Pt Will Perform Lower Body Dressing: with contact guard assist;with adaptive equipment;sit to/from stand Pt Will Transfer to Toilet: with supervision;ambulating;grab bars;bedside commode;regular height toilet Pt Will Perform Toileting - Clothing Manipulation and hygiene: with contact guard assist;sit to/from stand Pt Will Perform Tub/Shower Transfer: with contact guard assist;rolling walker;grab bars;shower seat;Shower transfer Additional ADL Goal #1: Patient will recall and teach back 3/5 5 P's of ECT during ADL's and mobility   OT Frequency:  Min 2X/week       AM-PAC OT 6 Clicks Daily Activity     Outcome Measure Help from another person eating meals?: A Little Help from another person taking care of personal grooming?: A Little Help from another person toileting, which includes using toliet, bedpan, or urinal?: A Lot Help from another person bathing (including washing, rinsing, drying)?: A Lot Help from another person to put on and taking off regular upper body clothing?: A Little Help from another person to put on and taking off regular lower body clothing?: A Lot 6 Click Score: 15   End of Session Equipment Utilized During Treatment: Gait belt;Rolling walker (2 wheels) Nurse Communication: Mobility status  Activity Tolerance: Patient limited by fatigue;Patient limited by lethargy Patient left: in bed;with call bell/phone within reach;with bed alarm set  OT Visit Diagnosis: Unsteadiness on feet (R26.81);Muscle weakness (generalized) (M62.81);Cognitive  communication deficit (R41.841);Pain Pain - part of body:  (abdomen)                Time: 1422-1500 OT Time Calculation (min): 38 min Charges:  OT General Charges $OT Visit: 1 Visit OT Evaluation $OT Eval Low Complexity: 1 Low OT Treatments $Self Care/Home Management : 8-22 mins  Tayvia Faughnan OT/L Acute Rehabilitation Department  [phone]  06/18/2024, 3:16 PM

## 2024-06-18 NOTE — Plan of Care (Signed)
  Problem: Education: Goal: Knowledge of General Education information will improve Description: Including pain rating scale, medication(s)/side effects and non-pharmacologic comfort measures 06/18/2024 0836 by Alaina Dozier PARAS, RN Outcome: Progressing 06/17/2024 1845 by Alaina Dozier PARAS, RN Outcome: Progressing   Problem: Health Behavior/Discharge Planning: Goal: Ability to manage health-related needs will improve 06/18/2024 0836 by Alaina Dozier PARAS, RN Outcome: Progressing 06/17/2024 1845 by Alaina Dozier PARAS, RN Outcome: Progressing   Problem: Clinical Measurements: Goal: Ability to maintain clinical measurements within normal limits will improve 06/18/2024 0836 by Alaina Dozier PARAS, RN Outcome: Progressing 06/17/2024 1845 by Alaina Dozier PARAS, RN Outcome: Progressing Goal: Will remain free from infection 06/18/2024 0836 by Alaina Dozier PARAS, RN Outcome: Progressing 06/17/2024 1845 by Alaina Dozier PARAS, RN Outcome: Progressing Goal: Diagnostic test results will improve 06/18/2024 0836 by Alaina Dozier PARAS, RN Outcome: Progressing 06/17/2024 1845 by Alaina Dozier PARAS, RN Outcome: Progressing

## 2024-06-18 NOTE — Anesthesia Postprocedure Evaluation (Signed)
 Anesthesia Post Note  Patient: Alexandra Morrison  Procedure(s) Performed: NEPHROURETERECTOMY, ROBOT-ASSISTED, LAPAROSCOPIC (Left: Abdomen) CYSTOSCOPY, WITH RESECTION OF THE LEFT URETERAL ORIFICE (Left: Bladder)     Patient location during evaluation: PACU Anesthesia Type: General Level of consciousness: awake and alert Pain management: pain level controlled Vital Signs Assessment: post-procedure vital signs reviewed and stable Respiratory status: spontaneous breathing, nonlabored ventilation, respiratory function stable and patient connected to nasal cannula oxygen Cardiovascular status: blood pressure returned to baseline and stable Postop Assessment: no apparent nausea or vomiting Anesthetic complications: no   No notable events documented.  Last Vitals:  Vitals:   06/18/24 0628 06/18/24 0646  BP: (!) 96/34 108/60  Pulse: 78 75  Resp: 16   Temp: 37.1 C   SpO2: 98%     Last Pain:  Vitals:   06/18/24 1002  TempSrc:   PainSc: 5                  Garnette [name]

## 2024-06-18 NOTE — Progress Notes (Signed)
 JB drained removed per MD order, dressing applied D Johnie PEAK

## 2024-06-18 NOTE — Evaluation (Signed)
 Physical Therapy Evaluation Patient Details Name: Alexandra Morrison MRN: [id number] DOB: 04/15/1946 Today's Date: 06/18/2024  History of Present Illness  78 yo female admitted 06/17/24 with diagnosis of  High-grade urothelial carcinoma of the left UPJ and renal pelvis,S/P NEPHROURETERECTOMY, LAPAROSCOPIC (Left: Abdomen)  CYSTOSCOPY, WITH RESECTION OF THE LEFT URETERAL ORIFICE on 06/17/24. EFY:ybenuybmnpipdf, anxiety , depression, bipolar DO, hydronephrosis, bilateral TKA, Bilateral TSA, sleep apnea, myelopathy, spinal stenosis.  Clinical Impression  Pt admitted with above diagnosis.  Pt currently with functional limitations due to the deficits listed below (see PT Problem List). Pt will benefit from acute skilled PT to increase their independence and safety with mobility to allow discharge.   The patient was lethargic, aroused with stimulation and  agreeable to  mobilize, patient had premedication.   Patient required max support to come to sitting , then stood and stepped along bedside x 5 steps with Hand hold assist.  Patient should progress to return home with spouse support. Patient was independent PTA, and uses her swim treadmill at home.         If plan is discharge home, recommend the following: A little help with walking and/or transfers;A little help with bathing/dressing/bathroom;Assistance with cooking/housework;Assist for transportation;Help with stairs or ramp for entrance   Can travel by private vehicle        Equipment Recommendations None recommended by PT  Recommendations for Other Services       Functional Status Assessment Patient has had a recent decline in their functional status and demonstrates the ability to make significant improvements in function in a reasonable and predictable amount of time.     Precautions / Restrictions Precautions Precautions: Fall Precaution/Restrictions Comments: left drain( since removed) Restrictions Weight Bearing Restrictions Per  Provider Order: No      Mobility  Bed Mobility Overal bed mobility: Needs Assistance Bed Mobility: Supine to Sit, Sit to Supine     Supine to sit: Mod assist, HOB elevated, Used rails Sit to supine: Min assist   General bed mobility comments: patioent able to move legs to bed edge, use of bed pad to assist patient to roll to left and lift trunk , Assist legs back onto bed and trunk to protect abdomen    Transfers Overall transfer level: Needs assistance Equipment used: 1 person hand held assist Transfers: Sit to/from Stand Sit to Stand: Mod assist           General transfer comment: mod support to stand x 2 from bed , then side step x 5 steps with hand hold    Ambulation/Gait                  Stairs            Wheelchair Mobility     Tilt Bed    Modified Rankin (Stroke Patients Only)       Balance Overall balance assessment: Needs assistance Sitting-balance support: Feet supported, Bilateral upper extremity supported Sitting balance-Leahy Scale: Fair     Standing balance support: Single extremity supported, During functional activity Standing balance-Leahy Scale: Poor                               Pertinent Vitals/Pain Pain Assessment Pain Assessment: Faces Pain Score: 5  Pain Location: left abdomen Pain Descriptors / Indicators: Discomfort, Guarding Pain Intervention(s): Premedicated before session, Monitored during session, Limited activity within patient's tolerance    Home Living Family/patient expects to be  discharged to:: Private residence Living Arrangements: Spouse/significant other Available Help at Discharge: Family Type of Home: House Home Access: Stairs to enter   Secretary/administrator of Steps: 1+1   Home Layout: One level Home Equipment: Agricultural consultant (2 wheels);BSC/3in1      Prior Function Prior Level of Function : Independent/Modified Independent                     Extremity/Trunk  Assessment   Upper Extremity Assessment Upper Extremity Assessment: Overall WFL for tasks assessed    Lower Extremity Assessment Lower Extremity Assessment: Generalized weakness    Cervical / Trunk Assessment Cervical / Trunk Assessment: Normal  Communication   Communication Communication: No apparent difficulties    Cognition Arousal: Lethargic Behavior During Therapy: WFL for tasks assessed/performed   PT - Cognitive impairments: No apparent impairments                         Following commands: Intact       Cueing Cueing Techniques: Verbal cues     General Comments      Exercises     Assessment/Plan    PT Assessment Patient needs continued PT services  PT Problem List Decreased strength;Decreased knowledge of use of DME;Decreased range of motion;Decreased activity tolerance;Decreased safety awareness;Decreased mobility;Decreased knowledge of precautions;Pain       PT Treatment Interventions DME instruction;Gait training;Patient/family education;Functional mobility training;Therapeutic activities;Therapeutic exercise    PT Goals (Current goals can be found in the Care Plan section)  Acute Rehab PT Goals Patient Stated Goal: go home PT Goal Formulation: With patient/family Time For Goal Achievement: 07/02/24 Potential to Achieve Goals: Good    Frequency Min 3X/week     Co-evaluation               AM-PAC PT 6 Clicks Mobility  Outcome Measure Help needed turning from your back to your side while in a flat bed without using bedrails?: A Lot Help needed moving from lying on your back to sitting on the side of a flat bed without using bedrails?: A Lot Help needed moving to and from a bed to a chair (including a wheelchair)?: A Lot Help needed standing up from a chair using your arms (e.g., wheelchair or bedside chair)?: A Lot Help needed to walk in hospital room?: Total Help needed climbing 3-5 steps with a railing? : Total 6 Click Score:  10    End of Session   Activity Tolerance: Patient limited by pain;Patient limited by fatigue Patient left: in bed;with family/visitor present;with call bell/phone within reach;with bed alarm set Nurse Communication: Mobility status PT Visit Diagnosis: Unsteadiness on feet (R26.81);Pain    Time: 8941-8884 PT Time Calculation (min) (ACUTE ONLY): 17 min   Charges:   PT Evaluation $PT Eval Low Complexity: 1 Low   PT General Charges $$ ACUTE PT VISIT: 1 Visit         Darice Potters PT Acute Rehabilitation Services Office [phone]   Potters Darice Norris 06/18/2024, 2:01 PM

## 2024-06-18 NOTE — Anesthesia Postprocedure Evaluation (Signed)
 Anesthesia Post Note  Patient: Alexandra Morrison  Procedure(s) Performed: NEPHROURETERECTOMY, ROBOT-ASSISTED, LAPAROSCOPIC (Left: Abdomen) CYSTOSCOPY, WITH RESECTION OF THE LEFT URETERAL ORIFICE (Left: Bladder)     Anesthesia Type: General Anesthetic complications: no   No notable events documented.  Last Vitals:  Vitals:   06/18/24 0628 06/18/24 0646  BP: (!) 96/34 108/60  Pulse: 78 75  Resp: 16   Temp: 37.1 C   SpO2: 98%     Last Pain:  Vitals:   06/18/24 1002  TempSrc:   PainSc: 5                  Garnette [name]

## 2024-06-19 ENCOUNTER — Other Ambulatory Visit: Payer: Self-pay

## 2024-06-19 ENCOUNTER — Inpatient Hospital Stay (HOSPITAL_COMMUNITY)

## 2024-06-19 DIAGNOSIS — J189 Pneumonia, unspecified organism: Secondary | ICD-10-CM

## 2024-06-19 DIAGNOSIS — C689 Malignant neoplasm of urinary organ, unspecified: Secondary | ICD-10-CM | POA: Diagnosis not present

## 2024-06-19 LAB — BASIC METABOLIC PANEL WITH GFR
Anion gap: 10 (ref 5–15)
BUN: 15 mg/dL (ref 8–23)
CO2: 23 mmol/L (ref 22–32)
Calcium: 8.5 mg/dL — ABNORMAL LOW (ref 8.9–10.3)
Chloride: 105 mmol/L (ref 98–111)
Creatinine, Ser: 1.41 mg/dL — ABNORMAL HIGH (ref 0.44–1.00)
GFR, Estimated: 38 mL/min — ABNORMAL LOW (ref >60)
Glucose, Bld: 92 mg/dL (ref 70–99)
Potassium: 4.7 mmol/L (ref 3.5–5.1)
Sodium: 138 mmol/L (ref 135–145)

## 2024-06-19 LAB — HEMOGLOBIN AND HEMATOCRIT, BLOOD
HCT: 34.1 % — ABNORMAL LOW (ref 36.0–46.0)
Hemoglobin: 9.9 g/dL — ABNORMAL LOW (ref 12.0–15.0)

## 2024-06-19 MED ORDER — METHOCARBAMOL 500 MG PO TABS
500.0000 mg | ORAL_TABLET | Freq: Four times a day (QID) | ORAL | Status: DC
  Administered 2024-06-19 – 2024-06-20 (×5): 500 mg via ORAL
  Filled 2024-06-19 (×5): qty 1

## 2024-06-19 MED ORDER — SODIUM CHLORIDE 0.9 % IV SOLN
500.0000 mg | INTRAVENOUS | Status: DC
  Administered 2024-06-19: 500 mg via INTRAVENOUS
  Filled 2024-06-19: qty 5

## 2024-06-19 MED ORDER — ACETAMINOPHEN 10 MG/ML IV SOLN
1000.0000 mg | Freq: Four times a day (QID) | INTRAVENOUS | Status: AC
  Administered 2024-06-19 – 2024-06-20 (×4): 1000 mg via INTRAVENOUS
  Filled 2024-06-19 (×4): qty 100

## 2024-06-19 MED ORDER — SODIUM CHLORIDE 0.9 % IV SOLN
2.0000 g | INTRAVENOUS | Status: DC
  Administered 2024-06-19: 2 g via INTRAVENOUS
  Filled 2024-06-19: qty 20

## 2024-06-19 MED ORDER — SIMVASTATIN 20 MG PO TABS: 10.0000 mg | ORAL_TABLET | Freq: Every day | ORAL | Status: DC

## 2024-06-19 MED ORDER — PRAVASTATIN SODIUM 20 MG PO TABS: 10.0000 mg | ORAL_TABLET | Freq: Every day | ORAL | Status: DC

## 2024-06-19 MED ORDER — CHLORHEXIDINE GLUCONATE CLOTH 2 % EX PADS: 6.0000 | MEDICATED_PAD | Freq: Every day | CUTANEOUS | Status: DC

## 2024-06-19 MED ORDER — GUAIFENESIN-DM 100-10 MG/5ML PO SYRP
5.0000 mL | ORAL_SOLUTION | ORAL | Status: DC | PRN
  Filled 2024-06-19: qty 10

## 2024-06-19 MED ORDER — ARIPIPRAZOLE 10 MG PO TABS
10.0000 mg | ORAL_TABLET | Freq: Every morning | ORAL | Status: DC
  Administered 2024-06-20: 10 mg via ORAL
  Filled 2024-06-19: qty 1

## 2024-06-19 MED ORDER — ARIPIPRAZOLE 10 MG PO TABS
10.0000 mg | ORAL_TABLET | Freq: Every day | ORAL | Status: DC
  Filled 2024-06-19: qty 1

## 2024-06-19 NOTE — Plan of Care (Signed)

## 2024-06-19 NOTE — Discharge Summary (Signed)
 Date of admission: 06/17/2024  Date of discharge: 06/19/2024  Admission diagnosis: urothelial cancer  Discharge diagnosis: urothelial cancer  History and Physical: For full details, please see admission history and physical. Briefly, Alexandra Morrison is a 78 y.o. year old patient with history of high grade upper tract cancer.   Hospital Course: She underwent a left sided nephro-ureterectomy on 06/17/24. On POD1, she was tolerating a clear liquid diet. Her JP Cr was negative and the drain removed. Due to poor ambulation and pain, she remained admitted for one additional night. On POD2, her pain was better controlled, she was voiding spontaneously, and tolerating regular diet.   Laboratory values:  Recent Labs    06/17/24 1720 06/18/24 0504 06/19/24 0524  HGB 11.9* 10.5* 9.9*  HCT 38.7 34.7* 34.1*   Recent Labs    06/18/24 0504 06/19/24 0524  CREATININE 1.41* 1.41*    Disposition: Home  Discharge instruction: The patient was instructed to be ambulatory but told to refrain from heavy lifting, strenuous activity, or driving.   Discharge medications:  Allergies as of 06/19/2024       Reactions   Propofol  Other (See Comments)    aggitation/hallucinations        Medication List     STOP taking these medications    b complex vitamins capsule   BIOTIN PLUS KERATIN PO   cholecalciferol 25 MCG (1000 UNIT) tablet Commonly known as: VITAMIN D3   Cinnamon Plus Chromium [phone] MCG-MG Caps Generic drug: Chromium-Cinnamon   CoQ-10 400 MG Caps   cyanocobalamin  1000 MCG tablet Commonly known as: VITAMIN B12   Fish Oil 1200 MG Caps   multivitamin with minerals tablet   niacin  500 MG tablet Commonly known as: VITAMIN B3   QC TUMERIC COMPLEX PO       TAKE these medications    Abilify  10 MG tablet Generic drug: ARIPiprazole  Take 10 mg by mouth in the morning.   acetaminophen  500 MG tablet Commonly known as: TYLENOL  Take 500 mg by mouth every 6 (six) hours as  needed (pain.).   ALPRAZolam  0.5 MG tablet Commonly known as: XANAX  Take 0.5 mg by mouth 3 (three) times daily as needed for anxiety.   amphetamine -dextroamphetamine  30 MG tablet Commonly known as: ADDERALL Take 60 mg by mouth in the morning.   buPROPion  300 MG 24 hr tablet Commonly known as: WELLBUTRIN  XL Take 300 mg by mouth in the morning.   calcium carbonate 600 MG tablet Commonly known as: OS-CAL Take 600 mg by mouth in the morning, at noon, and at bedtime.   diphenhydrAMINE  25 MG tablet Commonly known as: BENADRYL  Take 25 mg by mouth daily as needed for allergies.   divalproex  500 MG 24 hr tablet Commonly known as: DEPAKOTE  ER Take 1,000 mg by mouth daily with breakfast.   docusate sodium  100 MG capsule Commonly known as: COLACE Take 1 capsule (100 mg total) by mouth 2 (two) times daily.   ferrous sulfate  325 (65 FE) MG tablet Take 325 mg by mouth 3 (three) times a week. In the evening.   HYDROcodone -acetaminophen  5-325 MG tablet Commonly known as: NORCO/VICODIN Take 1-2 tablets by mouth every 6 (six) hours as needed for moderate pain (pain score 4-6) or severe pain (pain score 7-10).   levothyroxine  50 MCG tablet Commonly known as: SYNTHROID  Take 50 mcg by mouth daily with breakfast.   Lexapro  20 MG tablet Generic drug: escitalopram  Take 20 mg by mouth in the morning.   lovastatin 10 MG tablet Commonly  known as: MEVACOR Take 10 mg by mouth every evening.   magnesium  gluconate 500 (27 Mg) MG Tabs tablet Commonly known as: MAGONATE Take 500 mg by mouth 3 (three) times a week.   meclizine 25 MG tablet Commonly known as: ANTIVERT Take 25 mg by mouth daily as needed for nausea.   methocarbamol  500 MG tablet Commonly known as: ROBAXIN  Take 1 tablet (500 mg total) by mouth every 6 (six) hours as needed for muscle spasms.   metoprolol  succinate 25 MG 24 hr tablet Commonly known as: Toprol  XL Take 0.5 tablets (12.5 mg total) by mouth daily with supper.    pantoprazole  40 MG tablet Commonly known as: Protonix  Take 1 tablet (40 mg total) by mouth 2 (two) times daily. What changed: when to take this   Potassium 99 MG Tabs Take 99 mg by mouth 3 (three) times a week.   traZODone  100 MG tablet Commonly known as: DESYREL  Take 100 mg by mouth at bedtime as needed for sleep.        Followup:   Follow-up Information     Devere Lonni Righter, MD Follow up on 06/25/2024.   Specialty: Urology Why: at 11:30 Contact information: [address] [place] [address] [place] [address] [phone]         [facility] Follow up.   Why: [facility] HHPT/OT Contact information: [address] [place] [address] [phone]

## 2024-06-19 NOTE — Progress Notes (Signed)
 Occupational Therapy Treatment Patient Details Name: Alexandra Morrison MRN: [id number] DOB: 08-Jul-1946 Today's Date: 06/19/2024   History of present illness 78 yo female admitted 06/17/24 with diagnosis of  High-grade urothelial carcinoma of the left UPJ and renal pelvis,S/P NEPHROURETERECTOMY, LAPAROSCOPIC (Left: Abdomen)  CYSTOSCOPY, WITH RESECTION OF THE LEFT URETERAL ORIFICE on 06/17/24. EFY:ybenuybmnpipdf, anxiety , depression, bipolar DO, hydronephrosis, bilateral TKA, Bilateral TSA, sleep apnea, myelopathy, spinal stenosis.   OT comments  Patient seen for skilled OT treatment this am. Husband bedside for session and patient open to all therapy presented. Improved level of arousal this session with need for rests and increased time and cues for basic mobility and ADL's. Issued and introduced ECT principles with 5 P's handout with family reporting all DME and AE in place at home and husband able to assist. See energy conservation section for O2 sats response.  Patient requires continued Acute care hospital level OT services to progress safety and functional performance and allow for discharge. OT recommending HHOT with family assist and support upon discharge from hospital setting.        If plan is discharge home, recommend the following:  A lot of help with walking and/or transfers;A lot of help with bathing/dressing/bathroom;Assistance with cooking/housework;Direct supervision/assist for medications management;Direct supervision/assist for financial management;Assist for transportation;Help with stairs or ramp for entrance;Supervision due to cognitive status   Equipment Recommendations  None recommended by OT       Precautions / Restrictions Precautions Precautions: Fall Recall of Precautions/Restrictions: Impaired Precaution/Restrictions Comments: drains removed Restrictions Weight Bearing Restrictions Per Provider Order: No       Mobility Bed Mobility Overal bed mobility: Needs  Assistance Bed Mobility: Sit to Supine     Supine to sit: Min assist, HOB elevated, Used rails     General bed mobility comments: husband reports they do have an adjustable bed, cues and increased time    Transfers Overall transfer level: Needs assistance Equipment used: Rolling walker (2 wheels) Transfers: Sit to/from Stand, Bed to chair/wheelchair/BSC Sit to Stand: Min assist, From elevated surface, Mod assist     Step pivot transfers: Min assist, From elevated surface     General transfer comment: increased time, cues for hand placement     Balance Overall balance assessment: Needs assistance Sitting-balance support: Feet supported, Bilateral upper extremity supported Sitting balance-Leahy Scale: Fair     Standing balance support: Bilateral upper extremity supported, During functional activity Standing balance-Leahy Scale: Poor                             ADL either performed or assessed with clinical judgement   ADL Overall ADL's : Needs assistance/impaired Eating/Feeding: Set up;Sitting Eating/Feeding Details (indicate cue type and reason): recliner Grooming: Wash/dry hands;Wash/dry face;Oral care;Brushing hair;Set up;Sitting;Cueing for sequencing Grooming Details (indicate cue type and reason): recliner level with increased time Upper Body Bathing: Contact guard assist;Sitting   Lower Body Bathing: Moderate assistance;Sit to/from stand Lower Body Bathing Details (indicate cue type and reason): RW for support Upper Body Dressing : Minimal assistance;Sitting   Lower Body Dressing: Moderate assistance;Cueing for safety;Cueing for sequencing;Sit to/from stand Lower Body Dressing Details (indicate cue type and reason): husband reports he will assist and they have a reacher from her TKA's Toilet Transfer: Minimal assistance;BSC/3in1;Rolling walker (2 wheels);Cueing for safety;Cueing for sequencing Toilet Transfer Details (indicate cue type and reason):  increased time Toileting- Clothing Manipulation and Hygiene: Moderate assistance;Sitting/lateral lean Toileting - Clothing Manipulation Details (indicate  cue type and reason): has Foley still in place for bladder management     Functional mobility during ADLs: Minimal assistance;Moderate assistance;Rolling walker (2 wheels);Cueing for safety;Cueing for sequencing General ADL Comments: slow processing, increased time for all tasks, cues for pacing, husband present for education    Extremity/Trunk Assessment Upper Extremity Assessment Upper Extremity Assessment: Right hand dominant RUE Deficits / Details: hx of B RTC repairs but R UE limited end range 0-125, also reports long standing NM d/o undiagnossed since age 63 with intermittent B  tremors but at baseline RUE Coordination: decreased fine motor LUE Coordination: decreased fine motor   Lower Extremity Assessment Lower Extremity Assessment: Defer to PT evaluation        Vision   Vision Assessment?: No apparent visual deficits         Communication Communication Communication: No apparent difficulties   Cognition Arousal: Alert Behavior During Therapy: WFL for tasks assessed/performed Cognition: Cognition impaired   Orientation impairments: Time Awareness: Online awareness impaired Memory impairment (select all impairments): Short-term memory Attention impairment (select first level of impairment): Sustained attention Executive functioning impairment (select all impairments): Problem solving OT - Cognition Comments: continues with delayed processing, husband does not feel there has been a change other than effects of anesthesia and pain meds                 Following commands: Intact        Cueing   Cueing Techniques: Verbal cues        General Comments on 3 ltrs via [place] but was off on OT arrivla with SpO2 88%, placed 3 ltrs on and sats to 96%, decreased to 1 ltr and nursing requested O2 to be left at 1 ltr via  [place] with sats at 92-94%, issued and trained in IS as per nursing approval    Pertinent Vitals/ Pain       Pain Assessment Pain Assessment: Faces Faces Pain Scale: Hurts a little bit Pain Location: left abdomen Pain Descriptors / Indicators: Discomfort, Guarding Pain Intervention(s): Monitored during session, Premedicated before session, Repositioned, Relaxation   Frequency  Min 2X/week        Progress Toward Goals  OT Goals(current goals can now be found in the care plan section)  Progress towards OT goals: Progressing toward goals  Acute Rehab OT Goals Patient Stated Goal: to get moving better OT Goal Formulation: With patient/family Time For Goal Achievement: 07/02/24 Potential to Achieve Goals: Good ADL Goals Pt Will Perform Lower Body Bathing: with supervision;sit to/from stand;with adaptive equipment Pt Will Perform Lower Body Dressing: with contact guard assist;with adaptive equipment;sit to/from stand Pt Will Transfer to Toilet: with supervision;ambulating;grab bars;bedside commode;regular height toilet Pt Will Perform Toileting - Clothing Manipulation and hygiene: with contact guard assist;sit to/from stand Pt Will Perform Tub/Shower Transfer: with contact guard assist;rolling walker;grab bars;shower seat;Shower transfer Additional ADL Goal #1: Patient will recall and teach back 3/5 5 P's of ECT during ADL's and mobility  Plan         AM-PAC OT 6 Clicks Daily Activity     Outcome Measure   Help from another person eating meals?: A Little Help from another person taking care of personal grooming?: A Little Help from another person toileting, which includes using toliet, bedpan, or urinal?: A Lot Help from another person bathing (including washing, rinsing, drying)?: A Lot Help from another person to put on and taking off regular upper body clothing?: A Little Help from another person to put on and  taking off regular lower body clothing?: A Lot 6 Click Score:  15    End of Session Equipment Utilized During Treatment: Gait belt;Rolling walker (2 wheels)  OT Visit Diagnosis: Unsteadiness on feet (R26.81);Muscle weakness (generalized) (M62.81);Cognitive communication deficit (R41.841);Pain Pain - part of body:  (abdominal)   Activity Tolerance Patient limited by fatigue;Patient limited by lethargy   Patient Left in chair;with call bell/phone within reach;with chair alarm set;with family/visitor present   Nurse Communication Mobility status;Other (comment) (re: O2 and IS)        Time: 8964-8880 OT Time Calculation (min): 44 min  Charges: OT General Charges $OT Visit: 1 Visit OT Treatments $Self Care/Home Management : 23-37 mins $Therapeutic Activity: 8-22 mins  Brennen Gardiner OT/L Acute Rehabilitation Department  [phone]  06/19/2024, 12:24 PM

## 2024-06-19 NOTE — Progress Notes (Signed)
 2 Days Post-Op Subjective: Patient continues to endorse significant pain limiting movement and activity. Drinking well. Uop appropriate and drain output decreased. Foley draining CYU. Abdomen soft and expectedly tender.   Objective: Vital signs in last 24 hours: Temp:  [97.7 F (36.5 C)-98.7 F (37.1 C)] 97.7 F (36.5 C) (08/29 0615) Pulse Rate:  [79-97] 97 (08/29 0615) Resp:  [17-19] 17 (08/29 0615) BP: (103-115)/(49-56) 115/53 (08/29 0615) SpO2:  [92 %-100 %] 92 % (08/29 0615)  Intake/Output from previous day: 08/28 0701 - 08/29 0700 In: 812.2 [I.V.:812.2] Out: 1840 [Urine:1750; Drains:90] Intake/Output this shift: No intake/output data recorded.  Physical Exam:  General: Alert and oriented. CV: RRR Lungs: Clear bilaterally. GI: Soft, Nondistended. Incisions: Clean and dry. Urine: Clear Extremities: Nontender, no erythema, no edema.  Lab Results: Recent Labs    06/17/24 1720 06/18/24 0504 06/19/24 0524  HGB 11.9* 10.5* 9.9*  HCT 38.7 34.7* 34.1*          Recent Labs    06/18/24 0504 06/19/24 0524  CREATININE 1.41* 1.41*           Results for orders placed or performed during the hospital encounter of 06/17/24 (from the past 24 hours)  Creatinine, fluid (JP Drainage)     Status: None   Collection Time: 06/18/24  8:00 AM  Result Value Ref Range   Creat, Fluid 1.5 mg/dL   Fluid Type-FCRE JP DRAINAGE   Hemoglobin and hematocrit, blood     Status: Abnormal   Collection Time: 06/19/24  5:24 AM  Result Value Ref Range   Hemoglobin 9.9 (L) 12.0 - 15.0 g/dL   HCT 65.8 (L) 63.9 - 53.9 %  Basic metabolic panel     Status: Abnormal   Collection Time: 06/19/24  5:24 AM  Result Value Ref Range   Sodium 138 135 - 145 mmol/L   Potassium 4.7 3.5 - 5.1 mmol/L   Chloride 105 98 - 111 mmol/L   CO2 23 22 - 32 mmol/L   Glucose, Bld 92 70 - 99 mg/dL   BUN 15 8 - 23 mg/dL   Creatinine, Ser 8.58 (H) 0.44 - 1.00 mg/dL   Calcium 8.5 (L) 8.9 - 10.3 mg/dL   GFR, Estimated  38 (L) >60 mL/min   Anion gap 10 5 - 15    Assessment/Plan: POD# 2 s/p left nephU  -Drain removed -Encourage ambulating, incentive spirometry  -Continue CLD  -Continue Foley catheter -- likely TOV closer to discharge -Daily labs -MMPC, start Robaxin  for pain control   Lyle GEANNIE Civil, MD Resident, PGY4 Department of Urology

## 2024-06-19 NOTE — TOC Initial Note (Signed)
 Transition of Care [facility]) - Initial/Assessment Note    Patient Details  Name: Alexandra Morrison MRN: [id number] Date of Birth: 01-31-1946  Transition of Care [facility]) CM/SW Contact:    Bascom Service, RN Phone Number: 06/19/2024, 3:26 PM  Clinical Narrative: Patient w/no preference for [facility] rep [facility] HHPT/OT accepted. Has own transport home.                  Expected Discharge Plan: Home w Home Health Services Barriers to Discharge: Continued Medical Work up   Patient Goals and CMS Choice Patient states their goals for this hospitalization and ongoing recovery are:: Home CMS Medicare.gov Compare Post Acute Care list provided to:: Patient Choice offered to / list presented to : Patient [place] ownership interest in [facility].provided to:: Patient    Expected Discharge Plan and Services   Discharge Planning Services: CM Consult Post Acute Care Choice: Home Health Living arrangements for the past 2 months: Single Family Home                           HH Arranged: PT, OT HH Agency: [facility] Date [facility] Agency Contacted: 06/19/24 Time HH Agency Contacted: 1526 Representative spoke with at [facility] Agency: Amy  Prior Living Arrangements/Services Living arrangements for the past 2 months: Single Family Home Lives with:: Spouse   Do you feel safe going back to the place where you live?: Yes          Current home services: DME (rw)    Activities of Daily Living   ADL Screening (condition at time of admission) Independently performs ADLs?: Yes (appropriate for developmental age) Is the patient deaf or have difficulty hearing?: No Does the patient have difficulty seeing, even when wearing glasses/contacts?: No Does the patient have difficulty concentrating, remembering, or making decisions?: No  Permission Sought/Granted                  Emotional Assessment              Admission diagnosis:  Malignant neoplasm of left renal pelvis (HCC)  [C65.2] Urothelial cancer (HCC) [C68.9] Patient Active Problem List   Diagnosis Date Noted   Urothelial cancer (HCC) 06/17/2024   Encounter to establish care with new doctor 03/09/2022   Murmur 03/09/2022   Mixed hyperlipidemia 03/09/2022   GI bleed 09/12/2019   Hypothyroidism    Spinal stenosis    Symptomatic anemia    Macrocytosis    HLD (hyperlipidemia)    Anxiety    DDD (degenerative disc disease), lumbar    Left knee DJD 03/12/2018   Primary osteoarthritis of right knee 07/11/2017   Right knee DJD 07/11/2017   S/P cervical spinal fusion 11/25/2015   PCP:  Nanci Senior, MD Pharmacy:   [facility] [place], [place] - [address] [place] [address] Phone: [phone] Fax: [phone]     Social Drivers of Health (SDOH) Social History: SDOH Screenings   Food Insecurity: No Food Insecurity (06/17/2024)  Housing: Low Risk  (06/17/2024)  Transportation Needs: No Transportation Needs (06/17/2024)  Utilities: Not At Risk (06/17/2024)  Social Connections: Patient Declined (06/17/2024)  Tobacco Use: Medium Risk (06/17/2024)   SDOH Interventions:     Readmission Risk Interventions    06/18/2024   11:50 AM  Readmission Risk Prevention Plan  Transportation Screening Complete  PCP or Specialist Appt within 3-5 Days Complete  HRI or  Home Care Consult Complete  Social Work Consult for Recovery Care Planning/Counseling Complete  Palliative Care Screening Not Applicable  Medication Review [employer]) Complete

## 2024-06-19 NOTE — Consult Note (Addendum)
 Initial Consultation Note   Patient: Alexandra Morrison FMW:[id number] DOB: [birth year] PCP: Nanci Senior, MD DOA: 06/17/2024 DOS: the patient was seen and examined on 06/19/2024 Primary service: Devere Lonni Maidens*  Referring physician: Dr. Nieves  Reason for consult: Hypoxia  Assessment/Plan:  Acute hypoxic respiratory failure- patient hospitalized 8/27 to 8/29 and underwent nephroureterectomy 8/27 for urothelial cancer.  Did well postop from a renal standpoint, plan was to discharge today, but with ambulation, O2 sat dropped to 85%, at rest on my evaluation O2 sats 95% on room air.  She reports onset of productive cough for the past week with upper chest congestion.  Two-view chest x-ray shows left lung base opacity and small left pleural effusion-atelectasis or pneumonia. Symptoms predate general anesthesia for surgery, otherwise consider aspiration. 21-pack-year smoking history but quit 14 years ago.  Denies history of any lung disease diagnosis.  She is on PRN Lasix  20 mg- ~ 2ce/week for chronic lower extremity swelling that is unchanged from baseline. - Start IV ceftriaxone  and azithromycin  - Check QTc on EKG - Mucolytics PRN - CBC - Check COVID  Urothelial cancer- She underwent a left sided nephro-ureterectomy on 06/17/24 by Dr. Devere.  Admission hemoglobin 11.9, hemoglobin today is 9.9.  Hypothyroidism-  - On Synthroid   Bipolar disorder - On Depakote , Abilify   Diabetes mellitus- diet controlled. A1c 4.9. - Not on medication.  HTN- On metoprolol   TRH will continue to follow the patient.  HPI: Alexandra Morrison is a 78 y.o. female with past medical history of urothelial cancer, hypothyroidism, aortic stenosis, bipolar disorder, diabetes mellitus, OSA. Patient was admitted under urology service for elective nephroureterectomy for high-grade urothelial carcinoma of the left UPJ and renal pelvis and underwent the procedure 8/27.  She has done well from a renal  perspective, but today with ambulation, O2 sat dropped to 85% on room air.  At rest sats are greater than 90%.  She reports productive cough over the past week, with chest/nasal congestion, otherwise she denies difficulty breathing at rest or with activity, no chest pain, no dizziness.  She has chronic unchanged bilateral lower extremity swelling that has been present for years for which she takes as needed Lasix , on average twice a week- ??unknown dose.   Review of Systems: As written in HPI otherwise review of system negative.  Past Medical History:  Diagnosis Date   Abdominal aortic atherosclerosis (HCC)    Moderate   Anemia    Anxiety    Aortic stenosis, mild    Arthritis    Bipolar disorder (HCC)    type 2 hypomanic   Cancer (HCC)    kidney   Complication of anesthesia    pt request no spinal anesthesia only general   DDD (degenerative disc disease), lumbar    Depression    Diabetes mellitus without complication (HCC)    GERD (gastroesophageal reflux disease)    Headache    High cholesterol    History of diabetes mellitus    resolved after weight loss   History of mumps as a child    History of sleep apnea    most recent study was negative for sleep apnea, resolved after weight loss    Hydronephrosis    Hypothyroidism    Myelopathy (HCC)    Numbness and tingling in hands    Osteopenia    Osteoporosis    Right arm fracture 1953   x2   Scoliosis    per pt   Sleep apnea    Spinal stenosis  Moderate to severe   Vertigo    Wears glasses    Past Surgical History:  Procedure Laterality Date   APPENDECTOMY     CARPAL TUNNEL RELEASE Bilateral    CESAREAN SECTION  1971, 1973   x2   COLONOSCOPY WITH PROPOFOL  N/A 09/14/2019   Procedure: COLONOSCOPY WITH PROPOFOL ;  Surgeon: Dianna Specking, MD;  Location: [facility] ENDOSCOPY;  Service: Endoscopy;  Laterality: N/A;   CYSTOSCOPY W/ URETERAL STENT PLACEMENT Left 04/28/2024   Procedure: CYSTOSCOPY, URETEROSCOPY WITH RETROGRADE  PYELOGRAM AND URETERAL STENT INSERTION;  Surgeon: Devere Lonni Righter, MD;  Location: [facility] ORS;  Service: Urology;  Laterality: Left;   CYSTOSCOPY WITH FULGERATION Left 06/17/2024   Procedure: CYSTOSCOPY, WITH RESECTION OF THE LEFT URETERAL ORIFICE;  Surgeon: Devere Lonni Righter, MD;  Location: [facility] ORS;  Service: Urology;  Laterality: Left;   CYSTOSCOPY WITH STENT PLACEMENT Left 04/28/2024   Procedure: CYSTOSCOPY, LEFT URETEROSCOPY,  LEFT URETERAL DILATION WITH LEFT STENT INSERTION;  Surgeon: Devere Lonni Righter, MD;  Location: [facility] ORS;  Service: Urology;  Laterality: Left;   ESOPHAGOGASTRODUODENOSCOPY (EGD) WITH PROPOFOL  N/A 09/14/2019   Procedure: ESOPHAGOGASTRODUODENOSCOPY (EGD) WITH PROPOFOL ;  Surgeon: Dianna Specking, MD;  Location: [facility] ENDOSCOPY;  Service: Endoscopy;  Laterality: N/A;   EYE SURGERY Bilateral 2006   cataracts   FRACTURE SURGERY     Arm   GASTRIC BYPASS  2006   POSTERIOR CERVICAL FUSION/FORAMINOTOMY N/A 11/25/2015   Procedure: Posterior Cervical Fusion with lateral mass fixation Cervical fiv-Thoracic one, cervical laminectomy  - Cervical six-Cervical seven - Cervical seven-Thoracic one;  Surgeon: Alm GORMAN Molt, MD;  Location: [facility] NEURO ORS;  Service: Neurosurgery;  Laterality: N/A;   ROBOT ASSITED LAPAROSCOPIC NEPHROURETERECTOMY Left 06/17/2024   Procedure: NEPHROURETERECTOMY, ROBOT-ASSISTED, LAPAROSCOPIC;  Surgeon: Devere Lonni Righter, MD;  Location: [facility] ORS;  Service: Urology;  Laterality: Left;  ROBOTIC LEFT NEPHROURETERECTOMY, CYSTOSCOPY WITH TRANSURETHRAL RESECTION OF THE LEFT URETERAL ORIFICE   TOTAL KNEE ARTHROPLASTY Right 07/11/2017   Procedure: RIGHT TOTAL KNEE ARTHROPLASTY;  Surgeon: Duwayne Purchase, MD;  Location: [facility] ORS;  Service: Orthopedics;  Laterality: Right;  150 mins   TOTAL KNEE ARTHROPLASTY Left 03/12/2018   Procedure: LEFT TOTAL KNEE ARTHROPLASTY;  Surgeon: Duwayne Purchase, MD;  Location: [facility] ORS;  Service: Orthopedics;  Laterality: Left;  Adductor  Block   TOTAL SHOULDER ARTHROPLASTY Bilateral 2010, 2011   Social History:  reports that she quit smoking about 31 years ago. Her smoking use included cigarettes. She has never been exposed to tobacco smoke. She has never used smokeless tobacco. She reports current alcohol use. She reports that she does not use drugs.  Allergies  Allergen Reactions   Propofol  Other (See Comments)     aggitation/hallucinations    Family History  Problem Relation Age of Onset   Breast cancer Maternal Aunt        onset in 62s    Prior to Admission medications   Medication Sig Start Date End Date Taking? Authorizing Provider  ABILIFY  10 MG tablet Take 10 mg by mouth in the morning.   Yes [provider]  acetaminophen  (TYLENOL ) 500 MG tablet Take 500 mg by mouth every 6 (six) hours as needed (pain.).   Yes [provider]  ALPRAZolam  (XANAX ) 0.5 MG tablet Take 0.5 mg by mouth 3 (three) times daily as needed for anxiety.   Yes [provider]  amphetamine -dextroamphetamine  (ADDERALL) 30 MG tablet Take 60 mg by mouth in the morning.   Yes [provider]  b complex vitamins capsule Take  1 capsule by mouth 3 (three) times a week.   Yes [provider]  buPROPion  (WELLBUTRIN  XL) 300 MG 24 hr tablet Take 300 mg by mouth in the morning.   Yes [provider]  calcium carbonate (OS-CAL) 600 MG tablet Take 600 mg by mouth in the morning, at noon, and at bedtime.   Yes [provider]  cholecalciferol (VITAMIN D3) 25 MCG (1000 UNIT) tablet Take 1,000 Units by mouth 3 (three) times a week.   Yes [provider]  Chromium-Cinnamon (CINNAMON PLUS CHROMIUM) [phone] MCG-MG CAPS Take 1,000 mg by mouth at bedtime.   Yes [provider]  Coenzyme Q10 (COQ-10) 400 MG CAPS Take 400 mg by mouth in the morning.   Yes [provider]  cyanocobalamin  (VITAMIN B12) 1000 MCG tablet Take 1,000 mcg by mouth 3 (three) times a week.   Yes  [provider]  diphenhydrAMINE  (BENADRYL ) 25 MG tablet Take 25 mg by mouth daily as needed for allergies.   Yes [provider]  divalproex  (DEPAKOTE  ER) 500 MG 24 hr tablet Take 1,000 mg by mouth daily with breakfast.  03/28/17  Yes [provider]  docusate sodium  (COLACE) 100 MG capsule Take 1 capsule (100 mg total) by mouth 2 (two) times daily. 06/17/24  Yes Dancy, Alan, PA-C  ferrous sulfate  325 (65 FE) MG tablet Take 325 mg by mouth 3 (three) times a week. In the evening.   Yes [provider]  HYDROcodone -acetaminophen  (NORCO/VICODIN) 5-325 MG tablet Take 1-2 tablets by mouth every 6 (six) hours as needed for moderate pain (pain score 4-6) or severe pain (pain score 7-10). 06/17/24  Yes Dancy, Amanda, PA-C  levothyroxine  (SYNTHROID , LEVOTHROID) 50 MCG tablet Take 50 mcg by mouth daily with breakfast.    Yes [provider]  LEXAPRO  20 MG tablet Take 20 mg by mouth in the morning.   Yes [provider]  lovastatin (MEVACOR) 10 MG tablet Take 10 mg by mouth every evening. 07/30/19  Yes [provider]  magnesium  gluconate (MAGONATE) 500 (27 Mg) MG TABS tablet Take 500 mg by mouth 3 (three) times a week.   Yes [provider]  meclizine (ANTIVERT) 25 MG tablet Take 25 mg by mouth daily as needed for nausea.   Yes [provider]  methocarbamol  (ROBAXIN ) 500 MG tablet Take 1 tablet (500 mg total) by mouth every 6 (six) hours as needed for muscle spasms. 03/14/18  Yes Bissell, Jaclyn M, PA-C  metoprolol  succinate (TOPROL  XL) 25 MG 24 hr tablet Take 0.5 tablets (12.5 mg total) by mouth daily with supper. 04/16/23  Yes Tobb, Kardie, DO  Multiple Vitamins-Minerals (MULTIVITAMIN WITH MINERALS) tablet Take 1 tablet by mouth daily with lunch.    Yes [provider]  niacin  (VITAMIN B3) 500 MG tablet Take 500 mg by mouth 3 (three) times a week.   Yes [provider]  Omega-3 Fatty Acids (FISH OIL) 1200 MG CAPS  Take 1,200 mg by mouth in the morning and at bedtime.   Yes [provider]  pantoprazole  (PROTONIX ) 40 MG tablet Take 1 tablet (40 mg total) by mouth 2 (two) times daily. Patient taking differently: Take 40 mg by mouth in the morning. 09/15/19 06/03/27 Yes Samtani, Jai-Gurmukh, MD  Potassium 99 MG TABS Take 99 mg by mouth 3 (three) times a week.   Yes [provider]  Specialty Vitamins Products (BIOTIN PLUS KERATIN PO) Take 1 tablet by mouth in the morning.   Yes  [provider]  traZODone  (DESYREL ) 100 MG tablet Take 100 mg by mouth at bedtime as needed for sleep.  07/09/19  Yes [provider]  Turmeric (QC TUMERIC COMPLEX PO) Take 1 capsule by mouth every evening.   Yes [provider]    Physical Exam: Vitals:   06/19/24 1657 06/19/24 1658 06/19/24 1705 06/19/24 1852  BP:      Pulse:      Resp:      Temp:      TempSrc:      SpO2: 92% 90% 90% 92%  Weight:      Height:        Constitutional: NAD, calm, comfortable Eyes: PERRL, lids and conjunctivae normal ENMT: Mucous membranes are moist.  Neck: normal, supple, no masses, no thyromegaly Respiratory: clear to auscultation bilaterally, no wheezing, no crackles. Normal respiratory effort. No accessory muscle use.  Cardiovascular: Regular rate and rhythm, 2/6 systolic murmurs / no rubs / gallops.  Trace lower extremity edema to mid leg worse on the left-at baseline. Extremities warm. Abdomen: no tenderness, no masses palpated. No hepatosplenomegaly. Bowel sounds positive.  Musculoskeletal: no clubbing / cyanosis. No joint deformity upper and lower extremities. Skin: no rashes, lesions, ulcers. No induration Neurologic: No facial asymmetry, moves extremities spontaneously, speech fluent.  Psychiatric: Normal judgment and insight. Alert and oriented x 3. Normal mood.   Data Reviewed:  BMP today unremarkable.  Hemoglobin 9.9  Family Communication: Spouse at bedside.  DVT prophylaxis-SCDs,  per urology team.  Author: Tully FORBES Carwin, MD 06/19/2024 9:28 PM  For on call review [email or web address].

## 2024-06-19 NOTE — Progress Notes (Signed)
 SATURATION QUALIFICATIONS: (This note is used to comply with regulatory documentation for home oxygen)  Patient Saturations on Room Air at Rest = 85%  Patient Saturations on Room Air while Ambulating = N/A- did not ambulate without oxygen due to being 85% on room air at rest  Patient Saturations on 1 Liter of oxygen while Ambulating = 92%  Please briefly explain why patient needs home oxygen: Pt's spot checks on room air have been below 90% at rest, would benefit with home oxygen to further help ambulate as well as at rest at appropriate saturations.

## 2024-06-20 ENCOUNTER — Other Ambulatory Visit: Payer: Self-pay | Admitting: Internal Medicine

## 2024-06-20 DIAGNOSIS — C689 Malignant neoplasm of urinary organ, unspecified: Secondary | ICD-10-CM | POA: Diagnosis not present

## 2024-06-20 LAB — CBC WITH DIFFERENTIAL/PLATELET
Abs Immature Granulocytes: 0.06 K/uL (ref 0.00–0.07)
Basophils Absolute: 0 K/uL (ref 0.0–0.1)
Basophils Relative: 0 %
Eosinophils Absolute: 0.2 K/uL (ref 0.0–0.5)
Eosinophils Relative: 1 %
HCT: 32.3 % — ABNORMAL LOW (ref 36.0–46.0)
Hemoglobin: 9.5 g/dL — ABNORMAL LOW (ref 12.0–15.0)
Immature Granulocytes: 1 %
Lymphocytes Relative: 10 %
Lymphs Abs: 1.1 K/uL (ref 0.7–4.0)
MCH: 29.1 pg (ref 26.0–34.0)
MCHC: 29.4 g/dL — ABNORMAL LOW (ref 30.0–36.0)
MCV: 99.1 fL (ref 80.0–100.0)
Monocytes Absolute: 1.2 K/uL — ABNORMAL HIGH (ref 0.1–1.0)
Monocytes Relative: 10 %
Neutro Abs: 8.8 K/uL — ABNORMAL HIGH (ref 1.7–7.7)
Neutrophils Relative %: 78 %
Platelets: 124 K/uL — ABNORMAL LOW (ref 150–400)
RBC: 3.26 MIL/uL — ABNORMAL LOW (ref 3.87–5.11)
RDW: 13.6 % (ref 11.5–15.5)
WBC: 11.3 K/uL — ABNORMAL HIGH (ref 4.0–10.5)
nRBC: 0 % (ref 0.0–0.2)

## 2024-06-20 LAB — CBC
HCT: 30 % — ABNORMAL LOW (ref 36.0–46.0)
Hemoglobin: 9 g/dL — ABNORMAL LOW (ref 12.0–15.0)
MCH: 29.1 pg (ref 26.0–34.0)
MCHC: 30 g/dL (ref 30.0–36.0)
MCV: 97.1 fL (ref 80.0–100.0)
Platelets: 113 K/uL — ABNORMAL LOW (ref 150–400)
RBC: 3.09 MIL/uL — ABNORMAL LOW (ref 3.87–5.11)
RDW: 13.6 % (ref 11.5–15.5)
WBC: 9.1 K/uL (ref 4.0–10.5)
nRBC: 0 % (ref 0.0–0.2)

## 2024-06-20 LAB — BASIC METABOLIC PANEL WITH GFR
Anion gap: 11 (ref 5–15)
BUN: 17 mg/dL (ref 8–23)
CO2: 21 mmol/L — ABNORMAL LOW (ref 22–32)
Calcium: 8.5 mg/dL — ABNORMAL LOW (ref 8.9–10.3)
Chloride: 104 mmol/L (ref 98–111)
Creatinine, Ser: 1.35 mg/dL — ABNORMAL HIGH (ref 0.44–1.00)
GFR, Estimated: 41 mL/min — ABNORMAL LOW (ref >60)
Glucose, Bld: 77 mg/dL (ref 70–99)
Potassium: 4.2 mmol/L (ref 3.5–5.1)
Sodium: 137 mmol/L (ref 135–145)

## 2024-06-20 LAB — SARS CORONAVIRUS 2 BY RT PCR: SARS Coronavirus 2 by RT PCR: NEGATIVE

## 2024-06-20 MED ORDER — AMOXICILLIN-POT CLAVULANATE 500-125 MG PO TABS: 1.0000 | ORAL_TABLET | Freq: Two times a day (BID) | ORAL | 0 refills | Status: AC

## 2024-06-20 MED ORDER — GUAIFENESIN-DM 100-10 MG/5ML PO SYRP: 10.0000 mL | ORAL_SOLUTION | Freq: Four times a day (QID) | ORAL | 0 refills | Status: DC | PRN

## 2024-06-20 MED ORDER — AMOXICILLIN-POT CLAVULANATE 500-125 MG PO TABS
1.0000 | ORAL_TABLET | Freq: Two times a day (BID) | ORAL | 0 refills | Status: DC
Start: 2024-06-20 — End: 2024-06-20

## 2024-06-20 MED ORDER — AMOXICILLIN-POT CLAVULANATE 500-125 MG PO TABS: 1.0000 | ORAL_TABLET | Freq: Two times a day (BID) | ORAL | Status: DC

## 2024-06-20 MED ORDER — AMOXICILLIN-POT CLAVULANATE 500-125 MG PO TABS: 1.0000 | ORAL_TABLET | Freq: Two times a day (BID) | ORAL | 0 refills | Status: DC

## 2024-06-20 NOTE — Progress Notes (Signed)
 Mobility Specialist - Progress Note   06/20/24 1418  Mobility  Activity Ambulated with assistance  Level of Assistance Contact guard assist, steadying assist  Assistive Device Front wheel walker  Distance Ambulated (ft) 20 ft  Range of Motion/Exercises Active  Activity Response Tolerated well  Mobility Referral Yes  Mobility visit 1 Mobility  Mobility Specialist Start Time (ACUTE ONLY) 1408  Mobility Specialist Stop Time (ACUTE ONLY) 1418  Mobility Specialist Time Calculation (min) (ACUTE ONLY) 10 min   Pt requested assistance to bathroom. Returned to recliner chair with all needs met. Call bell in reach and chair alarm on. Husband and NT in room.  Erminio Leos,  Mobility Specialist Can be reached via Secure Chat

## 2024-06-20 NOTE — Progress Notes (Signed)
 Mobility Specialist - Progress Note   06/20/24 1021  Mobility  Activity Ambulated with assistance  Level of Assistance Minimal assist, patient does 75% or more  Assistive Device Front wheel walker  Distance Ambulated (ft) 100 ft  Range of Motion/Exercises Active  Activity Response Tolerated well  Mobility Referral Yes  Mobility visit 1 Mobility  Mobility Specialist Start Time (ACUTE ONLY) 1010  Mobility Specialist Stop Time (ACUTE ONLY) 1021  Mobility Specialist Time Calculation (min) (ACUTE ONLY) 11 min   Pt was found in bed and agreeable to ambulate. C/o pain at incision site. At EOS returned to recliner chair with all needs met. Call bell in reach and chair alarm on.  Erminio Leos,  Mobility Specialist Can be reached via Secure Chat

## 2024-06-20 NOTE — Discharge Summary (Signed)
 Physician Discharge Summary  Patient ID: Alexandra Morrison MRN: [id number] DOB/AGE: [birth year] 78 y.o.  Admit date: 06/17/2024 Discharge date: 06/20/2024  Admission Diagnoses:  Discharge Diagnoses:  Principal Problem:   Urothelial cancer [facility])   Discharged Condition: good  Hospital Course: For full details, please see admission history and physical. Briefly, CARLTON SWEANEY is a 78 y.o. year old patient with history of high grade upper tract cancer.    Hospital Course: She underwent a left sided nephro-ureterectomy on 06/17/24. On POD1, she was tolerating a clear liquid diet. Her JP Cr was negative and the drain removed. Due to poor ambulation and pain, she remained admitted for one additional night. On POD2, her pain was better controlled, she was voiding spontaneously, and tolerating regular diet.   Her sats were lower to 85% room air on POD#2. She was evaluated by hospitalist. CXR left atelectasis versus pneumonia.  She was started on antibiotics.  EKG was stable.  COVID-negative. Hypoxemia: Likely secondary to atelectasis. Currently on room air.  She is mobilized around on room air. Given dose of Rocephin  and azithromycin .  Will change to Augmentin  for additional 5 days. Chest physiotherapy.  Incentive spirometry techniques instructed-patient will take I-S home and practice at home. Patient seen and examined.  Medically stabilized.  Antibiotics and antitussive  prescriptions sent to the pharmacy.  Stable to discharge from medical perspective and GU perspective on POD#3.      DVT prophylaxis: SCDs Start: 06/17/24 1821  Consults: Hospitalist  Significant Diagnostic Studies: Chest x-ray, COVID test, EKG  Treatments: surgery: Procedure(s): 1.  Cystoscopy with left ureteral stent placement and TUR of the left ureteral orifice 2.  Robot-assisted laparoscopic left nephro ureterectomy  Discharge Exam: Blood pressure (!) 107/55, pulse 76, temperature 98.3 F (36.8 C), temperature source  Oral, resp. rate 16, height 4' 8 (1.422 m), weight 62.6 kg, SpO2 92%. No acute distress, sitting in chair watching TV Abdomen soft and nontender incisions clean dry and intact Extremity no calf pain or swelling Please see hospitalist exam  Disposition: Discharge disposition: 01-Home or Self Care        Allergies as of 06/20/2024       Reactions   Propofol  Other (See Comments)    aggitation/hallucinations        Medication List     STOP taking these medications    b complex vitamins capsule   BIOTIN PLUS KERATIN PO   cholecalciferol 25 MCG (1000 UNIT) tablet Commonly known as: VITAMIN D3   Cinnamon Plus Chromium [phone] MCG-MG Caps Generic drug: Chromium-Cinnamon   CoQ-10 400 MG Caps   cyanocobalamin  1000 MCG tablet Commonly known as: VITAMIN B12   Fish Oil 1200 MG Caps   multivitamin with minerals tablet   niacin  500 MG tablet Commonly known as: VITAMIN B3   QC TUMERIC COMPLEX PO       TAKE these medications    Abilify  10 MG tablet Generic drug: ARIPiprazole  Take 10 mg by mouth in the morning.   acetaminophen  500 MG tablet Commonly known as: TYLENOL  Take 500 mg by mouth every 6 (six) hours as needed (pain.).   ALPRAZolam  0.5 MG tablet Commonly known as: XANAX  Take 0.5 mg by mouth 3 (three) times daily as needed for anxiety.   amoxicillin -clavulanate 500-125 MG tablet Commonly known as: AUGMENTIN  Take 1 tablet by mouth 2 (two) times daily for 5 days.   amphetamine -dextroamphetamine  30 MG tablet Commonly known as: ADDERALL Take 60 mg by mouth in the morning.   buPROPion  300  MG 24 hr tablet Commonly known as: WELLBUTRIN  XL Take 300 mg by mouth in the morning.   calcium carbonate 600 MG tablet Commonly known as: OS-CAL Take 600 mg by mouth in the morning, at noon, and at bedtime.   diphenhydrAMINE  25 MG tablet Commonly known as: BENADRYL  Take 25 mg by mouth daily as needed for allergies.   divalproex  500 MG 24 hr tablet Commonly  known as: DEPAKOTE  ER Take 1,000 mg by mouth daily with breakfast.   docusate sodium  100 MG capsule Commonly known as: COLACE Take 1 capsule (100 mg total) by mouth 2 (two) times daily.   ferrous sulfate  325 (65 FE) MG tablet Take 325 mg by mouth 3 (three) times a week. In the evening.   guaiFENesin -dextromethorphan  100-10 MG/5ML syrup Commonly known as: ROBITUSSIN DM Take 10 mLs by mouth every 6 (six) hours as needed for cough.   HYDROcodone -acetaminophen  5-325 MG tablet Commonly known as: NORCO/VICODIN Take 1-2 tablets by mouth every 6 (six) hours as needed for moderate pain (pain score 4-6) or severe pain (pain score 7-10).   levothyroxine  50 MCG tablet Commonly known as: SYNTHROID  Take 50 mcg by mouth daily with breakfast.   Lexapro  20 MG tablet Generic drug: escitalopram  Take 20 mg by mouth in the morning.   lovastatin 10 MG tablet Commonly known as: MEVACOR Take 10 mg by mouth every evening.   magnesium  gluconate 500 (27 Mg) MG Tabs tablet Commonly known as: MAGONATE Take 500 mg by mouth 3 (three) times a week.   meclizine 25 MG tablet Commonly known as: ANTIVERT Take 25 mg by mouth daily as needed for nausea.   methocarbamol  500 MG tablet Commonly known as: ROBAXIN  Take 1 tablet (500 mg total) by mouth every 6 (six) hours as needed for muscle spasms.   metoprolol  succinate 25 MG 24 hr tablet Commonly known as: Toprol  XL Take 0.5 tablets (12.5 mg total) by mouth daily with supper.   pantoprazole  40 MG tablet Commonly known as: Protonix  Take 1 tablet (40 mg total) by mouth 2 (two) times daily. What changed: when to take this   Potassium 99 MG Tabs Take 99 mg by mouth 3 (three) times a week.   traZODone  100 MG tablet Commonly known as: DESYREL  Take 100 mg by mouth at bedtime as needed for sleep.        Follow-up Information     Devere Lonni Righter, MD Follow up on 06/25/2024.   Specialty: Urology Why: at 11:30 Contact information: [address]  [place] [address] [place] [address] [phone]         [facility] Follow up.   Why: [facility] Contact information: [address] [place] [address] [phone]                 Signed: Donnice Brooks 06/20/2024, 2:15 PM

## 2024-06-20 NOTE — Progress Notes (Signed)
 PROGRESS NOTE    Alexandra Morrison  FMW:[id number] DOB: 21-May-1946 DOA: 06/17/2024 PCP: Nanci Senior, MD    Brief Narrative:  78 year old admitted for elective left nephroureterectomy 8/27.  Noted to be hypoxemic on mobility at the time of discharge.  She has some dry cough and some congestion.  Chest x-ray showed lower lobe changes likely atelectasis.  Afebrile.  Consulted for comanagement.  Subjective: Patient seen and examined.  Denies any complaints.  Some dry cough present however without any fever.  WBC count is normal.  Eager to go home. Assessment & Plan:   Hypoxemia: Likely secondary to atelectasis. Currently on room air.  She is mobilized around on room air. Given dose of Rocephin  and azithromycin .  Will change to Augmentin  for additional 5 days. Chest physiotherapy.  Incentive spirometry techniques instructed-patient will take I-S home and practice at home. Mucolytic's.  Urothelial cancer:*Postsurgery.  As per surgical management.  Chronic medical issues including Hypothyroidism: On Synthroid  continue Bipolar disorder, on Depakote  and Abilify  continue Diet controlled type 2 diabetes, does not need therapeutics Hypertension, on metoprolol .   Patient seen and examined.  Medically stabilized.  Antibiotics and antitussive  prescriptions sent to the pharmacy.  Stable to discharge from medical perspective if deemed stable by surgery.    DVT prophylaxis: SCDs Start: 06/17/24 1821   Code Status: Full code Family Communication: None at the bedside Disposition Plan: Status is: Inpatient Remains inpatient appropriate because: Anticipate discharge by surgery     Consultants:  TRH  Procedures:  Left nephrectomy  Antimicrobials:  Rocephin  and azithromycin      Objective: Vitals:   06/19/24 1705 06/19/24 1852 06/19/24 2011 06/20/24 0507  BP:   (!) 123/57 (!) 104/51  Pulse:   86 76  Resp:   16 18  Temp:   97.6 F (36.4 C) 97.7 F (36.5 C)  TempSrc:   Oral  Oral  SpO2: 90% 92% 95% 93%  Weight:      Height:        Intake/Output Summary (Last 24 hours) at 06/20/2024 1103 Last data filed at 06/20/2024 1021 Gross per 24 hour  Intake 640 ml  Output 1100 ml  Net -460 ml   Filed Weights   06/17/24 1113  Weight: 62.6 kg    Examination:  General exam: Appears calm and comfortable.  On room air. Respiratory system: Clear to auscultation.  Reduced inspiratory effort.  No added sounds. Cardiovascular system: S1 & S2 heard, RRR. No pedal edema. Gastrointestinal system: Soft.  Nontender.  She has surgical ports and incisions look clean and dry on the left side. Central nervous system: Alert and oriented. No focal neurological deficits. Extremities: Symmetric 5 x 5 power.      Data Reviewed: I have personally reviewed following labs and imaging studies  CBC: Recent Labs  Lab 06/17/24 1720 06/18/24 0504 06/19/24 0524 06/19/24 2249 06/20/24 0600  WBC  --   --   --  11.3* 9.1  NEUTROABS  --   --   --  8.8*  --   HGB 11.9* 10.5* 9.9* 9.5* 9.0*  HCT 38.7 34.7* 34.1* 32.3* 30.0*  MCV  --   --   --  99.1 97.1  PLT  --   --   --  124* 113*   Basic Metabolic Panel: Recent Labs  Lab 06/18/24 0504 06/19/24 0524 06/20/24 0600  NA 143 138 137  K 4.9 4.7 4.2  CL 107 105 104  CO2 24 23 21*  GLUCOSE 80 92 77  BUN 19 15 17   CREATININE 1.41* 1.41* 1.35*  CALCIUM 8.2* 8.5* 8.5*   GFR: Estimated Creatinine Clearance: 26.2 mL/min (A) (by C-G formula based on SCr of 1.35 mg/dL (H)). Liver Function Tests: No results for input(s): AST, ALT, ALKPHOS, BILITOT, PROT, ALBUMIN in the last 168 hours. No results for input(s): LIPASE, AMYLASE in the last 168 hours. No results for input(s): AMMONIA in the last 168 hours. Coagulation Profile: No results for input(s): INR, PROTIME in the last 168 hours. Cardiac Enzymes: No results for input(s): CKTOTAL, CKMB, CKMBINDEX, TROPONINI in the last 168 hours. BNP (last 3  results) No results for input(s): PROBNP in the last 8760 hours. HbA1C: No results for input(s): HGBA1C in the last 72 hours. CBG: Recent Labs  Lab 06/17/24 1034 06/17/24 1622  GLUCAP 83 180*   Lipid Profile: No results for input(s): CHOL, HDL, LDLCALC, TRIG, CHOLHDL, LDLDIRECT in the last 72 hours. Thyroid  Function Tests: No results for input(s): TSH, T4TOTAL, FREET4, T3FREE, THYROIDAB in the last 72 hours. Anemia Panel: No results for input(s): VITAMINB12, FOLATE, FERRITIN, TIBC, IRON, RETICCTPCT in the last 72 hours. Sepsis Labs: No results for input(s): PROCALCITON, LATICACIDVEN in the last 168 hours.  Recent Results (from the past 240 hours)  SARS Coronavirus 2 by RT PCR (hospital order, performed in [facility] hospital lab) *cepheid single result test* Anterior Nasal Swab     Status: None   Collection Time: 06/19/24 10:40 PM   Specimen: Anterior Nasal Swab  Result Value Ref Range Status   SARS Coronavirus 2 by RT PCR NEGATIVE NEGATIVE Final    Comment: (NOTE) SARS-CoV-2 target nucleic acids are NOT DETECTED.  The SARS-CoV-2 RNA is generally detectable in upper and lower respiratory specimens during the acute phase of infection. The lowest concentration of SARS-CoV-2 viral copies this assay can detect is 250 copies / mL. A negative result does not preclude SARS-CoV-2 infection and should not be used as the sole basis for treatment or other patient management decisions.  A negative result may occur with improper specimen collection / handling, submission of specimen other than nasopharyngeal swab, presence of viral mutation(s) within the areas targeted by this assay, and inadequate number of viral copies (<250 copies / mL). A negative result must be combined with clinical observations, patient history, and epidemiological information.  Fact Sheet for Patients:   RoadLapTop.co.za  Fact Sheet for  Healthcare Providers: http://kim-miller.com/  This test is not yet approved or  cleared by the [place]  FDA and has been authorized for detection and/or diagnosis of SARS-CoV-2 by FDA under an Emergency Use Authorization (EUA).  This EUA will remain in effect (meaning this test can be used) for the duration of the COVID-19 declaration under Section 564(b)(1) of the Act, 21 U.S.C. section 360bbb-3(b)(1), unless the authorization is terminated or revoked sooner.  Performed at [facility], [address] [place], [place] [address]          Radiology Studies: DG Chest 2 View Result Date: 06/19/2024 CLINICAL DATA:  Hypoxia.  Pain and recent nephrectomy. EXAM: CHEST - 2 VIEW COMPARISON:  02/05/2024, CT 05/11/2024 FINDINGS: Lung volumes are low. The heart is normal in size. Stable mediastinal contours. Aortic atherosclerosis. Left lung base opacity and small left pleural effusion. The right lung is clear. No pulmonary edema. No pneumothorax. Bilateral shoulder arthroplasty. IMPRESSION: Left lung base opacity and small left pleural effusion. This may represent atelectasis or pneumonia. Electronically Signed   By: Andrea Gasman M.D.   On:  06/19/2024 21:03        Scheduled Meds:  amoxicillin -clavulanate  1 tablet Oral BID   ARIPiprazole   10 mg Oral q AM   buPROPion   300 mg Oral q AM   Chlorhexidine  Gluconate Cloth  6 each Topical Daily   divalproex   1,000 mg Oral Q breakfast   docusate sodium   100 mg Oral BID   escitalopram   20 mg Oral q AM   levothyroxine   50 mcg Oral Q breakfast   methocarbamol   500 mg Oral Q6H   metoprolol  succinate  12.5 mg Oral Q supper   pantoprazole   40 mg Oral q AM   pravastatin   10 mg Oral q1800   senna  1 tablet Oral BID   Continuous Infusions:   LOS: 3 days    Time spent: 35 minutes    Renato Applebaum, MD [facility]

## 2024-06-23 DIAGNOSIS — C651 Malignant neoplasm of right renal pelvis: Secondary | ICD-10-CM | POA: Diagnosis not present

## 2024-06-23 DIAGNOSIS — C672 Malignant neoplasm of lateral wall of bladder: Secondary | ICD-10-CM | POA: Diagnosis not present

## 2024-06-24 ENCOUNTER — Emergency Department (HOSPITAL_COMMUNITY)
Admission: EM | Admit: 2024-06-24 | Discharge: 2024-06-25 | Disposition: A | Discharge status: Home / Self Care | Attending: Emergency Medicine | Admitting: Emergency Medicine

## 2024-06-24 DIAGNOSIS — R918 Other nonspecific abnormal finding of lung field: Secondary | ICD-10-CM | POA: Diagnosis not present

## 2024-06-24 DIAGNOSIS — G8918 Other acute postprocedural pain: Secondary | ICD-10-CM | POA: Diagnosis not present

## 2024-06-24 DIAGNOSIS — J90 Pleural effusion, not elsewhere classified: Secondary | ICD-10-CM | POA: Diagnosis not present

## 2024-06-24 DIAGNOSIS — R0602 Shortness of breath: Secondary | ICD-10-CM | POA: Insufficient documentation

## 2024-06-24 DIAGNOSIS — R059 Cough, unspecified: Secondary | ICD-10-CM | POA: Diagnosis not present

## 2024-06-24 NOTE — ED Triage Notes (Signed)
 Pt reports SOB tonight and pain in the left side surgical area. Productive white sputum cough. Denies fevers.  says that when she would get up and walk around she would feel dizzy since Saturday, having difficulty walking since Sunday. Released Saturday (left sided nephro-ureterectomy on 06/17/24). Saturations 90-92% in triage. Last oxycodone  around 1900.

## 2024-06-25 ENCOUNTER — Emergency Department (HOSPITAL_COMMUNITY)

## 2024-06-25 ENCOUNTER — Other Ambulatory Visit: Payer: Self-pay

## 2024-06-25 DIAGNOSIS — J90 Pleural effusion, not elsewhere classified: Secondary | ICD-10-CM | POA: Diagnosis not present

## 2024-06-25 DIAGNOSIS — R8279 Other abnormal findings on microbiological examination of urine: Secondary | ICD-10-CM | POA: Diagnosis not present

## 2024-06-25 DIAGNOSIS — R918 Other nonspecific abnormal finding of lung field: Secondary | ICD-10-CM | POA: Diagnosis not present

## 2024-06-25 DIAGNOSIS — R059 Cough, unspecified: Secondary | ICD-10-CM | POA: Diagnosis not present

## 2024-06-25 DIAGNOSIS — R0602 Shortness of breath: Secondary | ICD-10-CM | POA: Diagnosis not present

## 2024-06-25 LAB — BASIC METABOLIC PANEL WITH GFR
Anion gap: 14 (ref 5–15)
BUN: 11 mg/dL (ref 8–23)
CO2: 24 mmol/L (ref 22–32)
Calcium: 8.9 mg/dL (ref 8.9–10.3)
Chloride: 99 mmol/L (ref 98–111)
Creatinine, Ser: 1.23 mg/dL — ABNORMAL HIGH (ref 0.44–1.00)
GFR, Estimated: 45 mL/min — ABNORMAL LOW (ref >60)
Glucose, Bld: 114 mg/dL — ABNORMAL HIGH (ref 70–99)
Potassium: 3.9 mmol/L (ref 3.5–5.1)
Sodium: 136 mmol/L (ref 135–145)

## 2024-06-25 LAB — CBC
HCT: 35.2 % — ABNORMAL LOW (ref 36.0–46.0)
Hemoglobin: 11.4 g/dL — ABNORMAL LOW (ref 12.0–15.0)
MCH: 30.3 pg (ref 26.0–34.0)
MCHC: 32.4 g/dL (ref 30.0–36.0)
MCV: 93.6 fL (ref 80.0–100.0)
Platelets: 254 K/uL (ref 150–400)
RBC: 3.76 MIL/uL — ABNORMAL LOW (ref 3.87–5.11)
RDW: 13.5 % (ref 11.5–15.5)
WBC: 14.6 K/uL — ABNORMAL HIGH (ref 4.0–10.5)
nRBC: 0 % (ref 0.0–0.2)

## 2024-06-25 MED ORDER — LACTATED RINGERS IV BOLUS: 1000.0000 mL | Freq: Once | INTRAVENOUS | Status: DC

## 2024-06-25 NOTE — ED Provider Notes (Signed)
 [facility]PART[facility] AT [facility] Provider Note   CSN: [id number] Arrival date & time: 06/24/24  2347     History No chief complaint on file.   HPI Alexandra Morrison is a 78 y.o. female presenting for chief complaint of weakness. States that she had a left sided nephrouretecerectomy 2/2 urothelial  cancer 7 days ago. Has been feeling weak and having SS pain.  Notes that she has has an appointment with urology tomorrow. Has been seeing hospitalists at home. Patient's recorded medical, surgical, social, medication list and allergies were reviewed in the Snapshot window as part of the initial history.   Review of Systems   Review of Systems  Constitutional:  Negative for chills and fever.  HENT:  Negative for ear pain and sore throat.   Eyes:  Negative for pain and visual disturbance.  Respiratory:  Positive for shortness of breath. Negative for cough.   Cardiovascular:  Negative for chest pain and palpitations.  Gastrointestinal:  Positive for abdominal pain. Negative for vomiting.  Genitourinary:  Negative for dysuria and hematuria.  Musculoskeletal:  Negative for arthralgias and back pain.  Skin:  Negative for color change and rash.  Neurological:  Negative for seizures and syncope.  All other systems reviewed and are negative.   Physical Exam Updated Vital Signs BP 98/65   Pulse (!) 102   Temp 98.1 F (36.7 C) (Oral)   Resp 20   LMP  (LMP Unknown)   SpO2 94%  Physical Exam Vitals and nursing note reviewed.  Constitutional:      General: She is not in acute distress.    Appearance: She is well-developed.  HENT:     Head: Normocephalic and atraumatic.  Eyes:     Conjunctiva/sclera: Conjunctivae normal.  Cardiovascular:     Rate and Rhythm: Normal rate and regular rhythm.     Heart sounds: No murmur heard. Pulmonary:     Effort: Pulmonary effort is normal. No respiratory distress.     Breath sounds: Normal breath sounds.  Abdominal:      General: There is no distension.     Palpations: Abdomen is soft.     Tenderness: There is no abdominal tenderness. There is no right CVA tenderness or left CVA tenderness.  Musculoskeletal:        General: No swelling or tenderness. Normal range of motion.     Cervical back: Neck supple.     Comments: All surgical sites are clean dry intact.  No evidence of dehiscence.  Skin:    General: Skin is warm and dry.  Neurological:     General: No focal deficit present.     Mental Status: She is alert and oriented to person, place, and time. Mental status is at baseline.     Cranial Nerves: No cranial nerve deficit.      ED Course/ Medical Decision Making/ A&P    Procedures Procedures   Medications Ordered in ED Medications - No data to display  Medical Decision Making:   78 year old female presenting with a chief complaint of generalized weakness malaise fatigue approximately 7 days postop of a left sided robotic nephrectomy. States that she was feeling progressively weak throughout the day today and wanted to come in to be evaluated tonight.  She states that during her 2-hour wait to be seen her weakness and fatigue have all completely resolved.  Husband at bedside states that she was having some shortness of breath with PT today and did have an  episode of hypoxia during exertion. Patient declines any symptoms at this time. Her history of present illness and physical exam findings are most consistent with postoperative deconditioning.  However, pulmonary embolism, postop infection, pneumonia, urinary tract infection could all present similarly.  Blood work was done in triage with evidence of a mild leukocytosis over her baseline but no other acute pathologies. By the time I evaluated the patient, she stated that she had an appointment with her surgeon within the next 8 hours and wanted to be discharged as soon as possible so she could get some sleep tonight.  We discussed possibility of  missed disease.  Initially had planned to order a PE study with abdomen pelvis follow-through due to reported hypoxia.  Also wanted to evaluate for postop infection with a CT scan and a urinalysis.  We discussed possibility of this blood clot tonight and patient/husband expressed understanding but still want to be discharged as they feel all symptoms have resolved.  They stated they would return if she has any recurrence of symptoms.  We discussed risk of being discharged at this time but they still want to proceed with discharge.  Disposition:  Patient is requesting discharge at this time.  Given patient's understanding of risk of severe missed diagnosis based on limitations of today's evaluation and risk of interval worsening of disease including life or limb threatening pathology, will participate in shared medical decision making and patient directed discharge at this time.  Patient is welcome to return for further diagnostic evaluation/therapeutic management at any time.   Clinical Impression:  1. SOB (shortness of breath)      Discharge   Final Clinical Impression(s) / ED Diagnoses Final diagnoses:  SOB (shortness of breath)    Rx / DC Orders ED Discharge Orders     None         Jerral Meth, MD 06/25/24 0157

## 2024-06-25 NOTE — ED Notes (Signed)
 Patient transported to X-ray and will transport to room.

## 2024-07-01 DIAGNOSIS — M62838 Other muscle spasm: Secondary | ICD-10-CM | POA: Diagnosis not present

## 2024-07-01 DIAGNOSIS — Z85528 Personal history of other malignant neoplasm of kidney: Secondary | ICD-10-CM | POA: Diagnosis not present

## 2024-07-01 DIAGNOSIS — R251 Tremor, unspecified: Secondary | ICD-10-CM | POA: Diagnosis not present

## 2024-07-06 DIAGNOSIS — G4733 Obstructive sleep apnea (adult) (pediatric): Secondary | ICD-10-CM | POA: Diagnosis not present

## 2024-07-06 DIAGNOSIS — G4700 Insomnia, unspecified: Secondary | ICD-10-CM | POA: Diagnosis not present

## 2024-07-20 NOTE — Progress Notes (Unsigned)
 Alexandra Morrison CONSULT NOTE  Patient Care Team: Alexandra Senior, MD as PCP - General (Family Medicine) Morrison, Kardie, DO as PCP - Cardiology (Cardiology)  ASSESSMENT & PLAN:  Alexandra Morrison with history of diabetes, hypotension, hyperlipidemia, bipolar disorder, CKD [facility] for left renal pelvis high-grade urothelial carcinoma.  Diagnosis: pT2 cN0 cM0 HG UC of left renal pelvis.  Last creatinine was 1.23.  CrCl of 38.  She is not a cisplatin candidate.  We discussed treatment to decrease risk of recurrence of your cancer after surgery.  Phase III POUT trial has demonstrated benefit of chemotherapy for four 21-day cycles of  gemcitabine (1,000 mg/m2 once per day on days 1 and 8) and either cisplatin (70 mg/m2) or, if glomerular filtration rate 30-49 mL/min, carboplatin (AUC 4.5 or 5) once on day 1. Adjuvant therapy significantly improved disease-free survival.  Updated 5-year disease-free survival rate was 62% in chemotherapy group versus 45% for surveillance.  Overall survival was 66% in chemotherapy group versus 57% in surveillance.    44% of those who started chemotherapy had grade 3 or higher (means more severe) treatment-emergent adverse events compared to 4% with surveillance. NEJM 2020.  Some of the potential side-effects included, though not limited to, including decreased blood count, decreased appetite, loss of taste, hearing loss, neuropathy, kidney damage, electrolyte abnormalities, edema, weight loss, life threatening infections, risk of allergic reactions, rash, increased liver enzyme, need for transfusions of blood products, nausea, vomiting, diarrhea, constipation, change in bowel habits, loss of hair, admission to hospital for various reasons, and risks of death.   Long term side-effects are also discussed including risks of permanent damage to nerve function, hearing loss, chronic fatigue, kidney damage with possibility needing  hemodialysis, and rare secondary malignancy including bone marrow disorders.  Rare side effects such as pneumonitis, rare blood issue of hemolytic uremic syndrome, encephalopathy, visual loss or other blood abnormalities had been reported.  If not considering chemotherapy, continue surveillance is recommended.  If cancer recurs on future imaging, will need to discuss potential options.  Currently we have better treatment in stage IV setting.  Most of the time when urothelial carcinoma returns, it will be in the stage IV setting and treatment will be palliative and not curative in nature.  In patients whom received chemotherapy in POUT, pT2 represented 34% of patients versus 63% of patient were pT3,  HR for DFS on pT2 was 1.04. 2024 JCO. Current NCCN guideline recommends adjuvant platinum-based chemotherapy should be discussed if no platinum neoadjuvant treatment given.  Given she is pT2 without neoadjuvant therapy, not in the population from the study to receive adjuvant nivolumab.  After discussion, patient is comfortable surveillance.  She will let us  know if she change her mind.  She will continue to follow Dr. Devere.  I will communicate with him.  Regardless of intervention, patient was informed a secondary or new malignancy in the bladder is still possible.  Mother had history of breast cancer. A maternal uncle and paternal aunt with colon cancer.  Will obtain MMR testing. Assessment & Plan Urothelial cancer [facility]) Discussed considering adjuvant platinum-gemcitabine versus surveillance Patient will move forward with surveillance with Dr. Devere Cystoscopy and cytology every 3 months for 1 year, then a longer interval based on findings May continue CT chest, abdomen and pelvis every 4 months during year 1 and 2, then every 6 months from year 3 to 5 or as likely indicated.  All questions were answered. The  patient knows to call the clinic with any problems, questions or concerns. No barriers  to learning was detected.  Alexandra JAYSON Chihuahua, MD 9/30/20254:01 PM  CHIEF COMPLAINTS/PURPOSE OF CONSULTATION:  Urothelial carcinoma renal pelvis  HISTORY OF PRESENTING ILLNESS:  Alexandra Morrison 78 y.o. female is here because of high-grade upper tract urothelial carcinoma of left renal pelvis.  I have reviewed her chart and materials related to her cancer extensively and collaborated history with the patient. Summary of oncologic history is as follows:  Patient has CT scan show mild left hydronephrosis in April 2025.  She had a stent placed and underwent ureteroscopy on 04/28/2024 and found to have extensive papillary tumor burden involving left renal pelvis with features concerning for urothelial carcinoma.  Left renal pelvis washing showed high-grade urothelial carcinoma.  She underwent left nephroureterectomy.  Final pathology showed pT2 HG UC of 1.2 cm.  Margins negative.  She was referred here to discuss adjuvant therapy.  CT chest from 04/2024 from [facility] showed negative for metastases.  At baseline, she falls a lot about once every 2 weeks to a month. Sometimes gets dizzy and sometimes tripped over for at least 2 years. No new persistent headache. She gets short of breath with too much exertion   Oncology History  Urothelial cancer (HCC)  01/28/2024 Imaging   CT abdomen and pelvis 1. Pancolitis. No bowel obstruction. 2. Mild left hydronephrosis. Focal area of narrowing of the proximal left ureter at the UPJ as well as additional faint urothelial thickening of the left renal collecting system noted. Underlying urothelial lesion is not excluded. Further evaluation with dedicated multiphasic CT urography or direct visualization with scope recommended. 3. Mild sigmoid diverticulosis.   04/28/2024 Pathology Results   A. RENAL PELVIS, LEFT, WASHING:  High grade urothelial carcinoma    05/11/2024 Imaging   CT chest from [facility] showed negative for metastases   06/17/2024  Initial Diagnosis   Urothelial cancer (HCC)   06/17/2024 Pathology Results   Left Nephroureterectomy  A. LEFT KIDNEY AND URETER:  High grade papillary urothelial carcinoma involving renal pelvis, size 1.2 cm  Tumor invades the muscularis (pT2)  Urothelial carcinoma in situ is identified at renal pelvis and adjacent ureter  Ureteral, vascular and all margins of resection are negative for carcinoma      MEDICAL HISTORY:  Past Medical History:  Diagnosis Date   Abdominal aortic atherosclerosis    Moderate   Anemia    Anxiety    Aortic stenosis, mild    Arthritis    Bipolar disorder (HCC)    type 2 hypomanic   Cancer (HCC)    kidney   Complication of anesthesia    pt request no spinal anesthesia only general   DDD (degenerative disc disease), lumbar    Depression    Diabetes mellitus without complication (HCC)    GERD (gastroesophageal reflux disease)    Headache    High cholesterol    History of diabetes mellitus    resolved after weight loss   History of mumps as a child    History of sleep apnea    most recent study was negative for sleep apnea, resolved after weight loss    Hydronephrosis    Hypothyroidism    Myelopathy (HCC)    Numbness and tingling in hands    Osteopenia    Osteoporosis    Right arm fracture 1953   x2   Scoliosis    per pt   Sleep apnea  Spinal stenosis    Moderate to severe   Vertigo    Wears glasses     SURGICAL HISTORY: Past Surgical History:  Procedure Laterality Date   APPENDECTOMY     CARPAL TUNNEL RELEASE Bilateral    CESAREAN SECTION  1971, 1973   x2   COLONOSCOPY WITH PROPOFOL  N/A 09/14/2019   Procedure: COLONOSCOPY WITH PROPOFOL ;  Surgeon: Dianna Specking, MD;  Location: [facility] ENDOSCOPY;  Service: Endoscopy;  Laterality: N/A;   CYSTOSCOPY W/ URETERAL STENT PLACEMENT Left 04/28/2024   Procedure: CYSTOSCOPY, URETEROSCOPY WITH RETROGRADE PYELOGRAM AND URETERAL STENT INSERTION;  Surgeon: Devere Lonni Righter, MD;  Location:  [facility] ORS;  Service: Urology;  Laterality: Left;   CYSTOSCOPY WITH FULGERATION Left 06/17/2024   Procedure: CYSTOSCOPY, WITH RESECTION OF THE LEFT URETERAL ORIFICE;  Surgeon: Devere Lonni Righter, MD;  Location: [facility];  Service: Urology;  Laterality: Left;   CYSTOSCOPY WITH STENT PLACEMENT Left 04/28/2024   Procedure: CYSTOSCOPY, LEFT URETEROSCOPY,  LEFT URETERAL DILATION WITH LEFT STENT INSERTION;  Surgeon: Devere Lonni Righter, MD;  Location: [facility];  Service: Urology;  Laterality: Left;   ESOPHAGOGASTRODUODENOSCOPY (EGD) WITH PROPOFOL  N/A 09/14/2019   Procedure: ESOPHAGOGASTRODUODENOSCOPY (EGD) WITH PROPOFOL ;  Surgeon: Dianna Specking, MD;  Location: [facility] ENDOSCOPY;  Service: Endoscopy;  Laterality: N/A;   EYE SURGERY Bilateral 2006   cataracts   FRACTURE SURGERY     Arm   GASTRIC BYPASS  2006   POSTERIOR CERVICAL FUSION/FORAMINOTOMY N/A 11/25/2015   Procedure: Posterior Cervical Fusion with lateral mass fixation Cervical fiv-Thoracic one, cervical laminectomy  - Cervical six-Cervical seven - Cervical seven-Thoracic one;  Surgeon: Alm GORMAN Molt, MD;  Location: [facility];  Service: Neurosurgery;  Laterality: N/A;   ROBOT ASSITED LAPAROSCOPIC NEPHROURETERECTOMY Left 06/17/2024   Procedure: NEPHROURETERECTOMY, ROBOT-ASSISTED, LAPAROSCOPIC;  Surgeon: Devere Lonni Righter, MD;  Location: [facility];  Service: Urology;  Laterality: Left;  ROBOTIC LEFT NEPHROURETERECTOMY, CYSTOSCOPY WITH TRANSURETHRAL RESECTION OF THE LEFT URETERAL ORIFICE   TOTAL KNEE ARTHROPLASTY Right 07/11/2017   Procedure: RIGHT TOTAL KNEE ARTHROPLASTY;  Surgeon: Duwayne Purchase, MD;  Location: [facility];  Service: Orthopedics;  Laterality: Right;  150 mins   TOTAL KNEE ARTHROPLASTY Left 03/12/2018   Procedure: LEFT TOTAL KNEE ARTHROPLASTY;  Surgeon: Duwayne Purchase, MD;  Location: [facility];  Service: Orthopedics;  Laterality: Left;  Adductor Block   TOTAL SHOULDER ARTHROPLASTY Bilateral 2010, 2011    SOCIAL HISTORY: Social  History   Socioeconomic History   Marital status: Married    Spouse name: Not on file   Number of children: Not on file   Years of education: Not on file   Highest education level: Not on file  Occupational History   Not on file  Tobacco Use   Smoking status: Former    Current packs/day: 0.00    Types: Cigarettes    Quit date: 01/20/1993    Years since quitting: 31.5    Passive exposure: Never   Smokeless tobacco: Never  Vaping Use   Vaping status: Never Used  Substance and Sexual Activity   Alcohol use: Yes    Comment: rarely   Drug use: No   Sexual activity: Yes    Birth control/protection: Post-menopausal  Other Topics Concern   Not on file  Social History Narrative   Not on file   Social Drivers of Health   Financial Resource Strain: Low Risk  (07/21/2024)   Overall Financial Resource Strain (CARDIA)    Difficulty of Paying Living Expenses: Not hard at all  Food  Insecurity: No Food Insecurity (07/21/2024)   Hunger Vital Sign    Worried About Running Out of Food in the Last Year: Never true    Ran Out of Food in the Last Year: Never true  Transportation Needs: No Transportation Needs (07/21/2024)   PRAPARE - Administrator, Civil Service (Medical): No    Lack of Transportation (Non-Medical): No  Physical Activity: Not on file  Stress: Not on file  Social Connections: Patient Declined (06/17/2024)   Social Connection and Isolation Panel    Frequency of Communication with Friends and Family: Patient declined    Frequency of Social Gatherings with Friends and Family: Patient declined    Attends Religious Services: Patient declined    Database administrator or Organizations: Patient declined    Attends Banker Meetings: Patient declined    Marital Status: Patient declined  Intimate Partner Violence: Not At Risk (07/21/2024)   Humiliation, Afraid, Rape, and Kick questionnaire    Fear of Current or Ex-Partner: No    Emotionally Abused: No     Physically Abused: No    Sexually Abused: No    FAMILY HISTORY: Family History  Problem Relation Age of Onset   Breast cancer Maternal Aunt        onset in 32s    ALLERGIES:  is allergic to propofol .  MEDICATIONS:  Current Outpatient Medications  Medication Sig Dispense Refill   ABILIFY  10 MG tablet Take 10 mg by mouth in the morning.     acetaminophen  (TYLENOL ) 500 MG tablet Take 500 mg by mouth every 6 (six) hours as needed (pain.).     ALPRAZolam  (XANAX ) 0.5 MG tablet Take 0.5 mg by mouth 3 (three) times daily as needed for anxiety.     amphetamine -dextroamphetamine  (ADDERALL) 30 MG tablet Take 60 mg by mouth in the morning.     buPROPion  (WELLBUTRIN  XL) 300 MG 24 hr tablet Take 300 mg by mouth in the morning.     calcium carbonate (OS-CAL) 600 MG tablet Take 600 mg by mouth in the morning, at noon, and at bedtime.     diphenhydrAMINE  (BENADRYL ) 25 MG tablet Take 25 mg by mouth daily as needed for allergies.     divalproex  (DEPAKOTE  ER) 500 MG 24 hr tablet Take 1,000 mg by mouth daily with breakfast.   0   ferrous sulfate  325 (65 FE) MG tablet Take 325 mg by mouth 3 (three) times a week. In the evening.     HYDROcodone -acetaminophen  (NORCO/VICODIN) 5-325 MG tablet Take 1-2 tablets by mouth every 6 (six) hours as needed for moderate pain (pain score 4-6) or severe pain (pain score 7-10). 20 tablet 0   levothyroxine  (SYNTHROID , LEVOTHROID) 50 MCG tablet Take 50 mcg by mouth daily with breakfast.      LEXAPRO  20 MG tablet Take 20 mg by mouth in the morning.     lovastatin (MEVACOR) 10 MG tablet Take 10 mg by mouth every evening.     magnesium  gluconate (MAGONATE) 500 (27 Mg) MG TABS tablet Take 500 mg by mouth 3 (three) times a week.     meclizine (ANTIVERT) 25 MG tablet Take 25 mg by mouth daily as needed for nausea.     methocarbamol  (ROBAXIN ) 500 MG tablet Take 1 tablet (500 mg total) by mouth every 6 (six) hours as needed for muscle spasms. 40 tablet 1   metoprolol  succinate  (TOPROL  XL) 25 MG 24 hr tablet Take 0.5 tablets (12.5 mg total) by mouth  daily with supper. 45 tablet 3   pantoprazole  (PROTONIX ) 40 MG tablet Take 1 tablet (40 mg total) by mouth 2 (two) times daily. (Patient taking differently: Take 40 mg by mouth in the morning.) 30 tablet 3   Potassium 99 MG TABS Take 99 mg by mouth 3 (three) times a week.     traZODone  (DESYREL ) 100 MG tablet Take 100 mg by mouth at bedtime as needed for sleep.      No current facility-administered medications for this visit.    REVIEW OF SYSTEMS:   All relevant systems were reviewed with the patient and are negative.  PHYSICAL EXAMINATION: ECOG PERFORMANCE STATUS: 2 - Symptomatic, <50% confined to bed  Vitals:   07/21/24 1200  BP: 129/63  Pulse: 92  Resp: 18  Temp: (!) 97.3 F (36.3 C)  SpO2: 95%   Filed Weights   07/21/24 1200  Weight: 136 lb 8 oz (61.9 kg)    GENERAL: alert, no distress and comfortable SKIN: skin color is normal, no jaundice EYES: sclera clear LYMPH:  no palpable lymphadenopathy in the cervical regions LUNGS: Effort normal, no respiratory distress.  Clear to auscultation bilaterally HEART: regular rate & rhythm  ABDOMEN: soft, non-tender and nondistended   LABORATORY DATA:  I have reviewed the data as listed Lab Results  Component Value Date   WBC 14.6 (H) 06/25/2024   HGB 11.4 (L) 06/25/2024   HCT 35.2 (L) 06/25/2024   MCV 93.6 06/25/2024   PLT 254 06/25/2024   Recent Labs    01/28/24 1030 02/05/24 1610 04/23/24 1134 06/19/24 0524 06/20/24 0600 06/25/24 0011  NA 135 135   < > 138 137 136  K 2.8* 5.2*   < > 4.7 4.2 3.9  CL 98 102   < > 105 104 99  CO2 25 22   < > 23 21* 24  GLUCOSE 128* 102*   < > 92 77 114*  BUN 17 17   < > 15 17 11   CREATININE 1.38* 1.51*   < > 1.41* 1.35* 1.23*  CALCIUM 8.3* 9.0   < > 8.5* 8.5* 8.9  GFRNONAA 40* 36*   < > 38* 41* 45*  PROT 6.3* 6.6  --   --   --   --   ALBUMIN 2.4* 2.3*  --   --   --   --   AST 22 24  --   --   --   --    ALT 14 11  --   --   --   --   ALKPHOS 61 83  --   --   --   --   BILITOT 0.4 <0.2  --   --   --   --    < > = values in this interval not displayed.    RADIOGRAPHIC STUDIES: I have personally reviewed the radiological images as listed and agreed with the findings in the report. DG Chest 2 View Result Date: 06/25/2024 CLINICAL DATA:  Shortness of breath and cough EXAM: CHEST - 2 VIEW COMPARISON:  Chest x-ray 06/19/2024 FINDINGS: Stable small left pleural effusion with minimal left basilar opacities. The lungs are otherwise clear. The cardiomediastinal silhouette is within normal limits. There are atherosclerotic calcifications of the aorta. The osseous structures are stable. IMPRESSION: Stable small left pleural effusion with minimal left basilar opacities. Electronically Signed   By: Greig Pique M.D.   On: 06/25/2024 00:57

## 2024-07-21 ENCOUNTER — Inpatient Hospital Stay

## 2024-07-21 VITALS — BP 129/63 | HR 92 | Temp 97.3°F | Resp 18 | Wt 136.5 lb

## 2024-07-21 DIAGNOSIS — Z87891 Personal history of nicotine dependence: Secondary | ICD-10-CM | POA: Diagnosis not present

## 2024-07-21 DIAGNOSIS — C679 Malignant neoplasm of bladder, unspecified: Secondary | ICD-10-CM | POA: Insufficient documentation

## 2024-07-21 DIAGNOSIS — C689 Malignant neoplasm of urinary organ, unspecified: Secondary | ICD-10-CM

## 2024-07-21 NOTE — Assessment & Plan Note (Addendum)
 Discussed considering adjuvant platinum-gemcitabine versus surveillance Patient will move forward with surveillance with Dr. Devere Cystoscopy and cytology every 3 months for 1 year, then a longer interval based on findings May continue CT chest, abdomen and pelvis every 4 months during year 1 and 2, then every 6 months from year 3 to 5 or as likely indicated.

## 2024-07-21 NOTE — Patient Instructions (Signed)
 We discussed treatment to decrease risk of recurrence of your cancer after surgery.  Phase III POUT trial has demonstrated benefit of chemotherapy for four 21-day cycles of  gemcitabine (1,000 mg/m2 once per day on days 1 and 8) and either cisplatin (70 mg/m2) or, if glomerular filtration rate 30-49 mL/min, carboplatin (AUC 4.5 or 5) once on day 1. Adjuvant therapy significantly improved disease-free survival.  Updated 5-year disease-free survival rate was 62% in chemotherapy group versus 45% for surveillance.  Overall survival was 66% in chemotherapy group versus 57% in surveillance.    44% of those who started chemotherapy had grade 3 or higher (means more severe) treatment-emergent adverse events compared to 4% with surveillance. NEJM 2020.  In patients whom received chemotherapy in POUT, pT2 represented 34% of patients versus 63% of patient were pT3,  HR for DFS on pT2 was 1.04. 2024 JCO. Current NCCN guideline recommends adjuvant platinum-based chemotherapy should be discussed if no platinum neoadjuvant treatment given.  Some of the potential side-effects included, though not limited to, including decreased blood count, decreased appetite, loss of taste, hearing loss, neuropathy, kidney damage, electrolyte abnormalities, edema, weight loss, life threatening infections, risk of allergic reactions, rash, increased liver enzyme, need for transfusions of blood products, nausea, vomiting, diarrhea, constipation, change in bowel habits, loss of hair, admission to hospital for various reasons, and risks of death.   Long term side-effects are also discussed including risks of permanent damage to nerve function, hearing loss, chronic fatigue, kidney damage with possibility needing hemodialysis, and rare secondary malignancy including bone marrow disorders.  Rare side effects such as pneumonitis, rare blood issue of hemolytic uremic syndrome, encephalopathy, visual loss or other blood abnormalities had been  reported.  If not considering chemotherapy, continue surveillance is recommended.  If cancer recurs on future imaging, will need to discuss potential options.  Currently we have better treatment in stage IV setting.  Most of the time when urothelial carcinoma returns, it will be in the stage IV setting and treatment will be palliative and not curative in nature.

## 2024-07-23 LAB — SURGICAL PATHOLOGY

## 2024-07-24 ENCOUNTER — Telehealth: Payer: Self-pay | Admitting: *Deleted

## 2024-07-24 NOTE — Telephone Encounter (Signed)
 Alexandra Morrison states she and her husband have decided to wait and see what happens. No treatment at this time. Will continue to have scans as ordered.   She was very appreciative of all of Dr Fredrik time and explanations.

## 2024-08-04 DIAGNOSIS — C652 Malignant neoplasm of left renal pelvis: Secondary | ICD-10-CM | POA: Diagnosis not present

## 2024-08-04 DIAGNOSIS — R39198 Other difficulties with micturition: Secondary | ICD-10-CM | POA: Diagnosis not present

## 2024-08-19 DIAGNOSIS — Z23 Encounter for immunization: Secondary | ICD-10-CM | POA: Diagnosis not present

## 2024-08-21 ENCOUNTER — Other Ambulatory Visit: Payer: Self-pay | Admitting: Family Medicine

## 2024-08-21 DIAGNOSIS — Z1231 Encounter for screening mammogram for malignant neoplasm of breast: Secondary | ICD-10-CM

## 2024-09-15 ENCOUNTER — Ambulatory Visit

## 2024-09-23 ENCOUNTER — Ambulatory Visit
Admission: RE | Admit: 2024-09-23 | Discharge: 2024-09-23 | Disposition: A | Source: Ambulatory Visit | Attending: Family Medicine | Admitting: Family Medicine

## 2024-09-23 DIAGNOSIS — Z1231 Encounter for screening mammogram for malignant neoplasm of breast: Secondary | ICD-10-CM

## 2024-09-30 DIAGNOSIS — Z0000 Encounter for general adult medical examination without abnormal findings: Secondary | ICD-10-CM | POA: Diagnosis not present

## 2024-09-30 DIAGNOSIS — E785 Hyperlipidemia, unspecified: Secondary | ICD-10-CM | POA: Diagnosis not present

## 2024-09-30 DIAGNOSIS — F3181 Bipolar II disorder: Secondary | ICD-10-CM | POA: Diagnosis not present

## 2024-09-30 DIAGNOSIS — H9190 Unspecified hearing loss, unspecified ear: Secondary | ICD-10-CM | POA: Diagnosis not present

## 2024-09-30 DIAGNOSIS — N1832 Chronic kidney disease, stage 3b: Secondary | ICD-10-CM | POA: Diagnosis not present

## 2024-09-30 DIAGNOSIS — E039 Hypothyroidism, unspecified: Secondary | ICD-10-CM | POA: Diagnosis not present

## 2024-09-30 DIAGNOSIS — Z9181 History of falling: Secondary | ICD-10-CM | POA: Diagnosis not present

## 2024-09-30 DIAGNOSIS — Z1331 Encounter for screening for depression: Secondary | ICD-10-CM | POA: Diagnosis not present

## 2024-09-30 DIAGNOSIS — E1121 Type 2 diabetes mellitus with diabetic nephropathy: Secondary | ICD-10-CM | POA: Diagnosis not present

## 2024-09-30 DIAGNOSIS — E1169 Type 2 diabetes mellitus with other specified complication: Secondary | ICD-10-CM | POA: Diagnosis not present

## 2024-09-30 DIAGNOSIS — M810 Age-related osteoporosis without current pathological fracture: Secondary | ICD-10-CM | POA: Diagnosis not present

## 2024-10-07 DIAGNOSIS — H6123 Impacted cerumen, bilateral: Secondary | ICD-10-CM | POA: Diagnosis not present

## 2024-10-07 DIAGNOSIS — N1832 Chronic kidney disease, stage 3b: Secondary | ICD-10-CM | POA: Diagnosis not present

## 2024-10-10 ENCOUNTER — Other Ambulatory Visit: Payer: Self-pay | Admitting: Cardiology
